# Patient Record
Sex: Female | Born: 1965 | Race: White | Hispanic: No | Marital: Married | Smoking: Never smoker
Health system: Southern US, Community
[De-identification: ages and names within clinical notes are randomized; demographics above are authoritative.]

## PROBLEM LIST (undated history)

## (undated) DIAGNOSIS — J309 Allergic rhinitis, unspecified: Secondary | ICD-10-CM

## (undated) DIAGNOSIS — M5410 Radiculopathy, site unspecified: Secondary | ICD-10-CM

## (undated) DIAGNOSIS — N938 Other specified abnormal uterine and vaginal bleeding: Secondary | ICD-10-CM

## (undated) DIAGNOSIS — M5030 Other cervical disc degeneration, unspecified cervical region: Secondary | ICD-10-CM

## (undated) DIAGNOSIS — Z8742 Personal history of other diseases of the female genital tract: Secondary | ICD-10-CM

## (undated) DIAGNOSIS — Z8489 Family history of other specified conditions: Secondary | ICD-10-CM

## (undated) DIAGNOSIS — F430 Acute stress reaction: Secondary | ICD-10-CM

## (undated) DIAGNOSIS — Z8619 Personal history of other infectious and parasitic diseases: Secondary | ICD-10-CM

## (undated) DIAGNOSIS — M25519 Pain in unspecified shoulder: Secondary | ICD-10-CM

## (undated) DIAGNOSIS — Z800 Family history of malignant neoplasm of digestive organs: Secondary | ICD-10-CM

## (undated) HISTORY — PX: ABDOMINAL HYSTERECTOMY: SHX81

## (undated) HISTORY — DX: Personal history of other diseases of the female genital tract: Z87.42

## (undated) HISTORY — DX: Allergic rhinitis, unspecified: J30.9

## (undated) HISTORY — DX: Personal history of other infectious and parasitic diseases: Z86.19

## (undated) HISTORY — DX: Acute stress reaction: F43.0

## (undated) HISTORY — DX: Radiculopathy, site unspecified: M54.10

## (undated) HISTORY — DX: Other cervical disc degeneration, unspecified cervical region: M50.30

## (undated) HISTORY — PX: LEEP: SHX91

## (undated) HISTORY — DX: Other specified abnormal uterine and vaginal bleeding: N93.8

## (undated) HISTORY — DX: Pain in unspecified shoulder: M25.519

## (undated) HISTORY — DX: Family history of malignant neoplasm of digestive organs: Z80.0

---

## 2009-07-19 DIAGNOSIS — B009 Herpesviral infection, unspecified: Secondary | ICD-10-CM | POA: Insufficient documentation

## 2009-07-19 DIAGNOSIS — Z800 Family history of malignant neoplasm of digestive organs: Secondary | ICD-10-CM | POA: Insufficient documentation

## 2009-08-07 DIAGNOSIS — L723 Sebaceous cyst: Secondary | ICD-10-CM | POA: Insufficient documentation

## 2011-11-17 ENCOUNTER — Ambulatory Visit: Payer: Self-pay | Admitting: Unknown Physician Specialty

## 2012-01-17 ENCOUNTER — Ambulatory Visit: Payer: Self-pay | Admitting: Obstetrics and Gynecology

## 2012-01-17 LAB — CBC
HCT: 40.5 % (ref 35.0–47.0)
MCH: 27.5 pg (ref 26.0–34.0)
MCV: 86 fL (ref 80–100)
Platelet: 321 10*3/uL (ref 150–440)
RDW: 13.6 % (ref 11.5–14.5)
WBC: 8.9 10*3/uL (ref 3.6–11.0)

## 2012-01-22 ENCOUNTER — Ambulatory Visit: Payer: Self-pay | Admitting: Obstetrics and Gynecology

## 2012-03-14 ENCOUNTER — Ambulatory Visit: Payer: Self-pay | Admitting: Obstetrics and Gynecology

## 2012-03-14 LAB — BASIC METABOLIC PANEL
BUN: 18 mg/dL (ref 7–18)
Chloride: 106 mmol/L (ref 98–107)
Co2: 24 mmol/L (ref 21–32)
Creatinine: 0.63 mg/dL (ref 0.60–1.30)
EGFR (Non-African Amer.): >60
Potassium: 4 mmol/L (ref 3.5–5.1)
Sodium: 136 mmol/L (ref 136–145)

## 2012-03-14 LAB — CBC
HCT: 38.9 % (ref 35.0–47.0)
MCH: 27.9 pg (ref 26.0–34.0)
MCV: 84 fL (ref 80–100)
Platelet: 346 10*3/uL (ref 150–440)
RBC: 4.65 10*6/uL (ref 3.80–5.20)
WBC: 7.6 10*3/uL (ref 3.6–11.0)

## 2012-03-14 LAB — PREGNANCY, URINE: Pregnancy Test, Urine: NEGATIVE m[IU]/mL

## 2012-03-18 ENCOUNTER — Ambulatory Visit: Payer: Self-pay | Admitting: Obstetrics and Gynecology

## 2012-03-19 LAB — CREATININE, SERUM: EGFR (Non-African Amer.): >60

## 2012-03-20 LAB — PATHOLOGY REPORT

## 2012-04-05 ENCOUNTER — Ambulatory Visit: Payer: Self-pay | Admitting: Family Medicine

## 2013-10-06 ENCOUNTER — Ambulatory Visit: Payer: Self-pay

## 2013-10-06 ENCOUNTER — Other Ambulatory Visit: Payer: Self-pay | Admitting: Occupational Medicine

## 2013-10-06 DIAGNOSIS — M79672 Pain in left foot: Secondary | ICD-10-CM

## 2014-08-11 NOTE — Op Note (Signed)
PATIENT NAME:  Jody Nolan, Jody Nolan MR#:  [id number] DATE OF BIRTH:  06-19-1965  DATE OF PROCEDURE:  03/18/2012  PREOPERATIVE DIAGNOSES:  1. Menorrhagia.  2. Severe dysmenorrhea.  3. History of cervical dysplasia.  POSTOPERATIVE DIAGNOSES: 1. Menorrhagia.  2. Severe dysmenorrhea.  3. History of cervical dysplasia.  4. Left ovarian cyst, simple.   OPERATIVE PROCEDURE: LAVH.   SURGEON: Alanda Slim. Lawton Dollinger, MD  FIRST ASSISTANT: None.   ANESTHESIA: General endotracheal.   INDICATIONS: The patient is a 49 year old married white female, para 1-0-0-2, who presents for surgical management of menorrhagia and pelvic pain. Previous hysteroscopy with dilation and curettage did not suppress abnormal bleeding. She desires definitive surgery at this time.   FINDINGS AT SURGERY: Grossly normal-appearing uterus. There were minimal adhesions over the bladder flap. The left ovary contained a simple cyst. Right tube and ovary were normal.   DESCRIPTION OF PROCEDURE: Patient was brought to the Operating Room where she was placed in the supine position. General endotracheal anesthesia was induced without difficulty. She was placed in the low lithotomy position using the bumblebee stirrups. A ChloraPrep and Betadine abdominal, perineal, intravaginal prep and drape was performed in the standard fashion. A latex free Foley catheter was placed and was draining clear yellow urine. A Hulka tenaculum was placed onto the cervix to facilitate uterine manipulation. Subumbilical vertical incision 5 mm in length was made. The Optiview laparoscopic trocar was then placed into the abdomen through direct entry with no evidence of bowel or vascular injury. Right and left lower quadrant ports, 5 mm in size, were also placed under direct visualization. The Ace Harmonic scalpel was used to facilitate the surgical procedure. The round ligaments were clamped, coagulated, and cut. The utero-ovarian ligaments were clamped, coagulated, and  cut using the Harmonic scalpel. This process continued down to the cardinal broad ligament complex region to the level of the lower uterine segment. At this point the bladder flap was created through sharp dissection and blunt dissection. The uterine vessels were exposed and were coagulated using Kleppinger bipolar cautery. The uterus did blanch as the vascular supply was cut off. Once this part of the procedure was completed the patient was then placed into the dorsal lithotomy position and repositioned for the vaginal portion of the hysterectomy. A weighted speculum was placed into the vagina. A double-tooth tenaculum was placed onto the cervix. Posterior colpotomy was attempted but not completed initially. The uterosacral ligaments were clamped, cut, and stick tied using 0 Vicryl. Cervix was circumscribed anteriorly and the bladder was dissected off the lower uterine segment through sharp and blunt dissection. The remainder of the cardinal broad ligament complexes were then clamped, cut, and stick tied. Eventually the posterior cul-de-sac was entered. The uterus was then removed from the operative field. The vaginal cuff was run using a 0 Vicryl suture in a baseball stitch manner. The vagina was closed with simple interrupted sutures of 2-0 chromic. Finally repeat laparoscopy was performed with the pelvis being irrigated. All pedicles were hemostatic. No significant bleeding was encountered. The procedure was then terminated with all instrumentation was removed from the pelvis. Pneumoperitoneum was released. The incisions were closed with Dermabond glue. Patient was then awakened, extubated, and taken to recovery room in satisfactory condition. Estimated blood loss was 200 mL. IV fluids were 2 liters. Urine output was not readily available at this time. All instruments, needle, and sponge counts were verified as correct. The patient did receive Ancef antibiotic prophylaxis.  ____________________________ Alanda Slim Junior Huezo, MD mad:cms D:  03/18/2012 17:17:41 ET T: 03/19/2012 09:49:46 ET JOB#: [id number]  cc: Hassell Done A. Luisalberto Beegle, MD, <Dictator>  Alanda Slim Daekwon Beswick MD ELECTRONICALLY SIGNED 03/21/2012 0:56

## 2014-08-11 NOTE — Op Note (Signed)
PATIENT NAME:  Jody Nolan, Jody Nolan MR#:  [id number] DATE OF BIRTH:  Feb 05, 1966  DATE OF PROCEDURE:  01/22/2012  PREOPERATIVE DIAGNOSES:  1. Menorrhagia.  2. Thickened endometrium on ultrasound.  3. Perineal skin lesions.   POSTOPERATIVE DIAGNOSES:  1. Menorrhagia.  2. Thickened endometrium on ultrasound.  3. Perineal skin lesions.  4. Severe cervical stenosis. 5. Uterine fibroids.   OPERATIVE PROCEDURES:  1. Excision of perineal skin lesions.  2. Hysteroscopy with dilation and curettage.  SURGEON: Brayton Mars, M.D.   FIRST ASSISTANT: None.   ANESTHESIA: General.   INDICATIONS: The patient is a 49 year old married white female with long history of menorrhagia. Preoperative ultrasound revealed a thickened endometrium with suspected endometrial polyps. Uterus had normal dimensions. Preoperative endometrial biopsy revealed scant tissue which was benign.   FINDINGS AT SURGERY: Densely stenotic cervix that required lacrimal duct probes as well as Hanks and Hegar dilators to gain access to the endometrial cavity. There was not significant endometrium present. The endometrial cavity did sound to 8 to 9 cm. The cavity was irregular and very suspicious for myoma involvement.   DESCRIPTION OF PROCEDURE: The patient was brought to the operating room where she was placed in the supine position. General anesthesia was induced without difficulty. She was placed in the dorsal lithotomy position using candy-cane stirrups. A Betadine and perineal intravaginal prep and drape was performed in the standard fashion. A red Robinson catheter was used to drain 100 mL of urine from the bladder. The perineal lesions which were pedunculated and on the right and left aspects of the perineum respectively were excised using a needle-tipped Bovie cautery. The two larger skin lesions required subcuticular sutures of 4-0 Vicryl for closure of the skin. Good hemostasis was obtained. Upon completion of the excisional  biopsies, the D and C/ hysteroscopy was performed.   A weighted speculum was placed into the vagina and a single-tooth tenaculum was placed on the anterior lip of the cervix. The cervix could not be sounded. Initially Hanks dilators could not identify the endocervical canal. Lacrimal duct probes then needed to be employed in order to identify the endocervical canal. Gradually the larger dilators gave way to the Hegar dilators, and then eventually the Hanks dilators to dilate adequately to gain access with the hysteroscope. The hysteroscopy with saline as irrigant revealed an endometrial cavity that was irregular and without significant tissue. Smooth and serrated curettes were used to curettage the endometrium with a very irregular contour being noted. Stone polyp forceps was used to extract residual tissue. Repeat hysteroscopy revealed no significant tissue left behind. The procedure was then terminated with all instrumentation being removed from the vagina. The patient was then awakened, mobilized, and taken to the recovery room in satisfactory condition.  Estimated blood loss was minimal. There were no complications. All instrument, needle, and sponge counts were verified as correct.  ADDENDUM NOTE:  The patient would be a candidate for laparoscopically-assisted vaginal hysterectomy if necessary.  ____________________________ Alanda Slim Davinity Fanara, MD mad:bjt D: 01/22/2012 19:07:16 ET T: 01/23/2012 09:37:08 ET JOB#: [id number]  cc: Hassell Done A. Chontel Warning, MD, <Dictator> Alanda Slim Shilee Biggs MD ELECTRONICALLY SIGNED 01/25/2012 20:39

## 2015-07-01 ENCOUNTER — Telehealth: Payer: Self-pay | Admitting: Family Medicine

## 2015-07-01 ENCOUNTER — Other Ambulatory Visit: Payer: Self-pay | Admitting: Family Medicine

## 2015-07-01 DIAGNOSIS — Z8619 Personal history of other infectious and parasitic diseases: Secondary | ICD-10-CM

## 2015-07-01 MED ORDER — VALACYCLOVIR HCL 1 G PO TABS: ORAL_TABLET | ORAL | Status: DC

## 2015-07-01 NOTE — Telephone Encounter (Signed)
Pt contacted office for refill request on the following medications: Valtrex 1 gm to [facility] [address]. Last written on 09/30/13 last OV 09/09/14. Pt is leaving tomorrow to go out of town and would like this sent today if possible. Pt stated she is getting a cold sore on her mouth. Please advise. Thanks TNP

## 2015-07-01 NOTE — Telephone Encounter (Signed)
Patient advised.

## 2015-07-01 NOTE — Telephone Encounter (Signed)
rx sent in 

## 2015-07-01 NOTE — Telephone Encounter (Signed)
Refill request

## 2016-01-17 ENCOUNTER — Encounter: Payer: Self-pay | Admitting: Family Medicine

## 2016-01-17 ENCOUNTER — Encounter: Payer: Self-pay | Admitting: *Deleted

## 2016-01-17 ENCOUNTER — Ambulatory Visit (INDEPENDENT_AMBULATORY_CARE_PROVIDER_SITE_OTHER): Payer: BC Managed Care – PPO | Admitting: Family Medicine

## 2016-01-17 VITALS — BP 110/70 | HR 85 | Temp 98.1°F | Resp 16 | Ht 65.5 in | Wt 200.6 lb

## 2016-01-17 DIAGNOSIS — J0100 Acute maxillary sinusitis, unspecified: Secondary | ICD-10-CM

## 2016-01-17 DIAGNOSIS — M25519 Pain in unspecified shoulder: Secondary | ICD-10-CM | POA: Insufficient documentation

## 2016-01-17 DIAGNOSIS — Z8619 Personal history of other infectious and parasitic diseases: Secondary | ICD-10-CM | POA: Diagnosis not present

## 2016-01-17 DIAGNOSIS — Z800 Family history of malignant neoplasm of digestive organs: Secondary | ICD-10-CM | POA: Insufficient documentation

## 2016-01-17 DIAGNOSIS — R87629 Unspecified abnormal cytological findings in specimens from vagina: Secondary | ICD-10-CM | POA: Insufficient documentation

## 2016-01-17 DIAGNOSIS — B009 Herpesviral infection, unspecified: Secondary | ICD-10-CM

## 2016-01-17 DIAGNOSIS — N938 Other specified abnormal uterine and vaginal bleeding: Secondary | ICD-10-CM | POA: Insufficient documentation

## 2016-01-17 DIAGNOSIS — M5410 Radiculopathy, site unspecified: Secondary | ICD-10-CM | POA: Insufficient documentation

## 2016-01-17 DIAGNOSIS — M5030 Other cervical disc degeneration, unspecified cervical region: Secondary | ICD-10-CM | POA: Insufficient documentation

## 2016-01-17 DIAGNOSIS — J309 Allergic rhinitis, unspecified: Secondary | ICD-10-CM | POA: Insufficient documentation

## 2016-01-17 DIAGNOSIS — H6980 Other specified disorders of Eustachian tube, unspecified ear: Secondary | ICD-10-CM | POA: Insufficient documentation

## 2016-01-17 MED ORDER — AMOXICILLIN-POT CLAVULANATE 875-125 MG PO TABS: 1.0000 | ORAL_TABLET | Freq: Two times a day (BID) | ORAL | 0 refills | Status: DC

## 2016-01-17 MED ORDER — VALACYCLOVIR HCL 1 G PO TABS: ORAL_TABLET | ORAL | 1 refills | Status: DC

## 2016-01-17 MED ORDER — HYDROCODONE-HOMATROPINE 5-1.5 MG/5ML PO SYRP: ORAL_SOLUTION | ORAL | 0 refills | Status: DC

## 2016-01-17 NOTE — Progress Notes (Signed)
Subjective:     Patient ID: Jody Nolan, female   DOB: 12-02-1965, 50 y.o.   MRN: [id number]  HPI  Chief Complaint  Patient presents with  . Cough    Patient comes into office today with concerns of cough and congestion for the past week. Patient states associated with cough she has sinus pain and pressure, patient states pain from cough has been radiating in her chest and ribs. Patient reports taking otc Robitussin Multi-Symptom and Aleve D  States she has persistent left sinus pressure, PND, and accompanying cough. Unable to characterize her drainage. Hx of allergic rhinitis and surgery for correction of a deviated septum. States she is planning to update her health maintenance with [place].   Review of Systems     Objective:   Physical Exam  Constitutional: She appears well-developed and well-nourished. No distress.  Ears: T.M's intact without inflammation Sinuses: moderate left maxillary sinus tenderness Throat: no tonsillar enlargement or exudate Neck: no cervical adenopathy Lungs: clear     Assessment:    1. Acute maxillary sinusitis, recurrence not specified - amoxicillin-clavulanate (AUGMENTIN) 875-125 MG tablet; Take 1 tablet by mouth 2 (two) times daily.  Dispense: 20 tablet; Refill: 0 - HYDROcodone-homatropine (HYCODAN) 5-1.5 MG/5ML syrup; 5 ml 4-6 hours as needed for cough  Dispense: 240 mL; Refill: 0  2. H/O cold sores - valACYclovir (VALTREX) 1000 MG tablet; Take two tablets every 12 h for one day at onset of cold core  Dispense: 12 tablet; Refill: 1    Plan:    Consider ENT referral if recurrent or not improving.

## 2016-01-17 NOTE — Patient Instructions (Signed)
Discussed use of Mucinex D for congestion. 

## 2016-02-25 ENCOUNTER — Encounter: Payer: BC Managed Care – PPO | Admitting: Physician Assistant

## 2016-03-01 ENCOUNTER — Encounter: Payer: Self-pay | Admitting: Physician Assistant

## 2016-03-01 ENCOUNTER — Ambulatory Visit (INDEPENDENT_AMBULATORY_CARE_PROVIDER_SITE_OTHER): Payer: BC Managed Care – PPO | Admitting: Physician Assistant

## 2016-03-01 VITALS — BP 120/80 | HR 99 | Temp 98.3°F | Resp 16 | Ht 66.0 in | Wt 197.4 lb

## 2016-03-01 DIAGNOSIS — Z0000 Encounter for general adult medical examination without abnormal findings: Secondary | ICD-10-CM | POA: Diagnosis not present

## 2016-03-01 DIAGNOSIS — R059 Cough, unspecified: Secondary | ICD-10-CM

## 2016-03-01 DIAGNOSIS — R87629 Unspecified abnormal cytological findings in specimens from vagina: Secondary | ICD-10-CM | POA: Diagnosis not present

## 2016-03-01 DIAGNOSIS — Z136 Encounter for screening for cardiovascular disorders: Secondary | ICD-10-CM | POA: Diagnosis not present

## 2016-03-01 DIAGNOSIS — Z1231 Encounter for screening mammogram for malignant neoplasm of breast: Secondary | ICD-10-CM | POA: Diagnosis not present

## 2016-03-01 DIAGNOSIS — Z1239 Encounter for other screening for malignant neoplasm of breast: Secondary | ICD-10-CM

## 2016-03-01 DIAGNOSIS — Z1272 Encounter for screening for malignant neoplasm of vagina: Secondary | ICD-10-CM | POA: Diagnosis not present

## 2016-03-01 DIAGNOSIS — Z13220 Encounter for screening for lipoid disorders: Secondary | ICD-10-CM

## 2016-03-01 DIAGNOSIS — R05 Cough: Secondary | ICD-10-CM

## 2016-03-01 DIAGNOSIS — Z833 Family history of diabetes mellitus: Secondary | ICD-10-CM | POA: Diagnosis not present

## 2016-03-01 MED ORDER — PREDNISONE 10 MG PO TABS: ORAL_TABLET | ORAL | 0 refills | Status: DC

## 2016-03-01 NOTE — Progress Notes (Signed)
Patient: Jody Nolan, Female    DOB: 1965/12/14, 50 y.o.   MRN: [id number] Visit Date: 03/01/2016  Today's Provider: Mar Daring, PA-C   Chief Complaint  Patient presents with  . Annual Exam   Subjective:    Annual physical exam Jody Nolan is a 50 y.o. female who presents today for health maintenance and complete physical. She feels fairly well. She reports exercising none. She does work at school and walks a lot. She reports she is sleeping fairly well right now because of her cold but overall she she sleep well.  She declined Influenza vaccine.  CPE-10/30/13 Pap:10/28/12 Negative, HPV-Negative @[facility] . Hx of abnormal pap. She had her hysterectomy 5 years ago.(partial) Mammogram:11/21/2013-BI-RADS 1 -----------------------------------------------------------------   Review of Systems  Constitutional: Negative.   HENT: Positive for congestion, rhinorrhea and sinus pressure.        Patient reports that she finished the antibiotic but still taking the cough syrup, zyrtec, mucinex and sudafed  Eyes: Negative.   Respiratory: Positive for cough.   Cardiovascular: Negative.   Gastrointestinal: Negative.   Endocrine: Negative.   Genitourinary: Negative.   Musculoskeletal: Negative.   Skin: Negative.   Allergic/Immunologic: Negative.   Neurological: Negative.   Hematological: Negative.   Psychiatric/Behavioral: Negative.     Social History      She  reports that she has never smoked. She has never used smokeless tobacco. She reports that she drinks alcohol. She reports that she does not use drugs.       Social History   Social History  . Marital status: Married    Spouse name: N/A  . Number of children: 2  . Years of education: College Grad   Occupational History  . employed     [employer] History Main Topics  . Smoking status: Never Smoker  . Smokeless tobacco: Never Used  . Alcohol use Yes  . Drug use: No  .  Sexual activity: Yes   Other Topics Concern  . None   Social History Narrative  . None    Past Medical History:  Diagnosis Date  . Allergic rhinitis   . Degeneration of cervical intervertebral disc   . DUB (dysfunctional uterine bleeding)   . Family history of colon cancer   . History of abnormal cervical Pap smear   . History of chicken pox   . Radiculopathy   . Reaction, stress, acute   . Shoulder pain      Patient Active Problem List   Diagnosis Date Noted  . Allergic rhinitis 01/17/2016  . Degeneration of cervical intervertebral disc 01/17/2016  . Dysfunctional uterine bleeding 01/17/2016  . Family history of GI tract cancer 01/17/2016  . Vaginal Pap smear, abnormal 01/17/2016  . Radiculopathy 01/17/2016  . Family history of colon cancer 07/19/2009  . Uncomplicated herpes simplex 07/19/2009    Past Surgical History:  Procedure Laterality Date  . CESAREAN SECTION  2000  . LEEP  1990's    Family History        Family Status  Relation Status  . Mother Alive  . Father Deceased at age 44   colon cancer  . Brother Alive  . Daughter Alive  . Son Alive  . Maternal Grandmother Alive, age 64y  . Paternal 97, age 78y        Her family history includes Cancer in her father; Hypertension in her brother and paternal grandmother; Stroke in her paternal grandmother.  No Known Allergies   Current Outpatient Prescriptions:  .  Cetirizine HCl (ZYRTEC ALLERGY) 10 MG CAPS, Take 1 tablet by mouth daily., Disp: , Rfl:  .  HYDROcodone-homatropine (HYCODAN) 5-1.5 MG/5ML syrup, 5 ml 4-6 hours as needed for cough, Disp: 240 mL, Rfl: 0 .  valACYclovir (VALTREX) 1000 MG tablet, Take two tablets every 12 h for one day at onset of cold core, Disp: 12 tablet, Rfl: 1   Patient Care Team: Carmon Ginsberg, PA as PCP - General (Family Medicine)      Objective:   Vitals: BP 120/80 (BP Location: Right Arm, Patient Position: Sitting, Cuff Size: Normal)   Pulse 99    Temp 98.3 F (36.8 C) (Oral)   Resp 16   Ht 5\' 6"  (1.676 m)   Wt 197 lb 6.4 oz (89.5 kg)   SpO2 100%   BMI 31.86 kg/m    Physical Exam  Constitutional: She is oriented to person, place, and time. She appears well-developed and well-nourished. No distress.  HENT:  Head: Normocephalic and atraumatic.  Right Ear: Hearing, tympanic membrane, external ear and ear canal normal.  Left Ear: Hearing, tympanic membrane, external ear and ear canal normal.  Nose: Nose normal.  Mouth/Throat: Uvula is midline, oropharynx is clear and moist and mucous membranes are normal. No oropharyngeal exudate.  Eyes: Conjunctivae and EOM are normal. Pupils are equal, round, and reactive to light. Right eye exhibits no discharge. Left eye exhibits no discharge. No scleral icterus.  Neck: Normal range of motion. Neck supple. No JVD present. Carotid bruit is not present. No tracheal deviation present. No thyromegaly present.  Cardiovascular: Normal rate, regular rhythm, normal heart sounds and intact distal pulses.  Exam reveals no gallop and no friction rub.   No murmur heard. Pulmonary/Chest: Effort normal and breath sounds normal. No respiratory distress. She has no wheezes. She has no rales. She exhibits no tenderness. Right breast exhibits no inverted nipple, no mass, no nipple discharge, no skin change and no tenderness. Left breast exhibits no inverted nipple, no mass, no nipple discharge, no skin change and no tenderness. Breasts are symmetrical.  Abdominal: Soft. Bowel sounds are normal. She exhibits no distension and no mass. There is no tenderness. There is no rebound and no guarding. Hernia confirmed negative in the right inguinal area and confirmed negative in the left inguinal area.  Genitourinary: Rectum normal. No breast swelling, tenderness, discharge or bleeding. Pelvic exam was performed with patient supine. There is no rash, tenderness, lesion or injury on the right labia. There is no rash,  tenderness, lesion or injury on the left labia. Right adnexum displays no mass, no tenderness and no fullness. Left adnexum displays no mass, no tenderness and no fullness. No erythema, tenderness or bleeding in the vagina. No signs of injury around the vagina. Vaginal discharge (thick white discharge noted) found.  Genitourinary Comments: Uterus and cervix surgically absent  Musculoskeletal: Normal range of motion. She exhibits no edema or tenderness.  Lymphadenopathy:    She has no cervical adenopathy.       Right: No inguinal adenopathy present.       Left: No inguinal adenopathy present.  Neurological: She is alert and oriented to person, place, and time. She has normal reflexes. No cranial nerve deficit. Coordination normal.  Skin: Skin is warm and dry. No rash noted. She is not diaphoretic.  Psychiatric: She has a normal mood and affect. Her behavior is normal. Judgment and thought content normal.  Vitals reviewed.  Depression Screen No flowsheet data found.    Assessment & Plan:     Routine Health Maintenance and Physical Exam  Exercise Activities and Dietary recommendations Goals    None       There is no immunization history on file for this patient.  Health Maintenance  Topic Date Due  . HIV Screening  10/21/1982  . TETANUS/TDAP  10/21/1986  . PAP SMEAR  10/29/2015  . INFLUENZA VACCINE  11/23/2015     Discussed health benefits of physical activity, and encouraged her to engage in regular exercise appropriate for her age and condition.    1. Annual physical exam Normal physical exam today. Will check labs as below and f/u pending lab results. If labs are stable and WNL she will not need to have these rechecked for one year at her next annual physical exam. She is to call the office in the meantime if she has any acute issue, questions or concerns. - CBC with Differential/Platelet - Comprehensive metabolic panel - TSH  2. Breast cancer screening Breast  exam today was normal. There is no family history of breast cancer. She does perform regular self breast exams. Mammogram was ordered as below. She may schedule her mammogram at her convenience with [facility]. - MM DIGITAL SCREENING BILATERAL; Future  3. Screening for vaginal cancer Pap collected today. Will send as below and f/u pending results. - Pap IG and HPV (high risk) DNA detection  4. Vaginal Pap smear, abnormal Pap collected today. Will send as below and f/u pending results. Previous pap in 2014 and 2015 normal. Pap prior to hysterectomy was abnormal. If this pap is normal will make 3rd normal and she can go back on regular screenings. - Pap IG and HPV (high risk) DNA detection  5. Family history of diabetes mellitus (DM) Will check labs as below and f/u pending results. - Hemoglobin A1c  6. Encounter for lipid screening for cardiovascular disease Will check labs as below and f/u pending results. - Lipid panel  7. Cough Lingering cough that has not resolved following antibiotic treatment from 01/17/2016. I will give her prednisone taper as below for cough. She is to call for cough does not improve following this treatment. - predniSONE (DELTASONE) 10 MG tablet; Take 6 tabs PO on day 1&2, 5 tabs PO on day 3&4, 4 tabs PO on day 5&6, 3 tabs PO on day 7&8, 2 tabs PO on day 9&10, 1 tab PO on day 11&12.  Dispense: 42 tablet; Refill: 0  --------------------------------------------------------------------    Mar Daring, PA-C  [facility]

## 2016-03-01 NOTE — Patient Instructions (Signed)
Health Maintenance, Female Adopting a healthy lifestyle and getting preventive care can go a long way to promote health and wellness. Talk with your health care provider about what schedule of regular examinations is right for you. This is a good chance for you to check in with your provider about disease prevention and staying healthy. In between checkups, there are plenty of things you can do on your own. Experts have done a lot of research about which lifestyle changes and preventive measures are most likely to keep you healthy. Ask your health care provider for more information. WEIGHT AND DIET  Eat a healthy diet  Be sure to include plenty of vegetables, fruits, low-fat dairy products, and lean protein.  Do not eat a lot of foods high in solid fats, added sugars, or salt.  Get regular exercise. This is one of the most important things you can do for your health.  Most adults should exercise for at least 150 minutes each week. The exercise should increase your heart rate and make you sweat (moderate-intensity exercise).  Most adults should also do strengthening exercises at least twice a week. This is in addition to the moderate-intensity exercise.  Maintain a healthy weight  Body mass index (BMI) is a measurement that can be used to identify possible weight problems. It estimates body fat based on height and weight. Your health care provider can help determine your BMI and help you achieve or maintain a healthy weight.  For females 20 years of age and older:   A BMI below 18.5 is considered underweight.  A BMI of 18.5 to 24.9 is normal.  A BMI of 25 to 29.9 is considered overweight.  A BMI of 30 and above is considered obese.  Watch levels of cholesterol and blood lipids  You should start having your blood tested for lipids and cholesterol at 48 years of age, then have this test every 5 years.  You may need to have your cholesterol levels checked more often if:  Your lipid  or cholesterol levels are high.  You are older than 48 years of age.  You are at high risk for heart disease.  CANCER SCREENING   Lung Cancer  Lung cancer screening is recommended for adults 55-80 years old who are at high risk for lung cancer because of a history of smoking.  A yearly low-dose CT scan of the lungs is recommended for people who:  Currently smoke.  Have quit within the past 15 years.  Have at least a 30-pack-year history of smoking. A pack year is smoking an average of one pack of cigarettes a day for 1 year.  Yearly screening should continue until it has been 15 years since you quit.  Yearly screening should stop if you develop a health problem that would prevent you from having lung cancer treatment.  Breast Cancer  Practice breast self-awareness. This means understanding how your breasts normally appear and feel.  It also means doing regular breast self-exams. Let your health care provider know about any changes, no matter how small.  If you are in your 20s or 30s, you should have a clinical breast exam (CBE) by a health care provider every 1-3 years as part of a regular health exam.  If you are 40 or older, have a CBE every year. Also consider having a breast X-ray (mammogram) every year.  If you have a family history of breast cancer, talk to your health care provider about genetic screening.  If you   are at high risk for breast cancer, talk to your health care provider about having an MRI and a mammogram every year.  Breast cancer gene (BRCA) assessment is recommended for women who have family members with BRCA-related cancers. BRCA-related cancers include:  Breast.  Ovarian.  Tubal.  Peritoneal cancers.  Results of the assessment will determine the need for genetic counseling and BRCA1 and BRCA2 testing. Cervical Cancer Your health care provider may recommend that you be screened regularly for cancer of the pelvic organs (ovaries, uterus, and  vagina). This screening involves a pelvic examination, including checking for microscopic changes to the surface of your cervix (Pap test). You may be encouraged to have this screening done every 3 years, beginning at age 21.  For women ages 30-65, health care providers may recommend pelvic exams and Pap testing every 3 years, or they may recommend the Pap and pelvic exam, combined with testing for human papilloma virus (HPV), every 5 years. Some types of HPV increase your risk of cervical cancer. Testing for HPV may also be done on women of any age with unclear Pap test results.  Other health care providers may not recommend any screening for nonpregnant women who are considered low risk for pelvic cancer and who do not have symptoms. Ask your health care provider if a screening pelvic exam is right for you.  If you have had past treatment for cervical cancer or a condition that could lead to cancer, you need Pap tests and screening for cancer for at least 20 years after your treatment. If Pap tests have been discontinued, your risk factors (such as having a new sexual partner) need to be reassessed to determine if screening should resume. Some women have medical problems that increase the chance of getting cervical cancer. In these cases, your health care provider may recommend more frequent screening and Pap tests. Colorectal Cancer  This type of cancer can be detected and often prevented.  Routine colorectal cancer screening usually begins at 48 years of age and continues through 48 years of age.  Your health care provider may recommend screening at an earlier age if you have risk factors for colon cancer.  Your health care provider may also recommend using home test kits to check for hidden blood in the stool.  A small camera at the end of a tube can be used to examine your colon directly (sigmoidoscopy or colonoscopy). This is done to check for the earliest forms of colorectal  cancer.  Routine screening usually begins at age 50.  Direct examination of the colon should be repeated every 5-10 years through 48 years of age. However, you may need to be screened more often if early forms of precancerous polyps or small growths are found. Skin Cancer  Check your skin from head to toe regularly.  Tell your health care provider about any new moles or changes in moles, especially if there is a change in a mole's shape or color.  Also tell your health care provider if you have a mole that is larger than the size of a pencil eraser.  Always use sunscreen. Apply sunscreen liberally and repeatedly throughout the day.  Protect yourself by wearing long sleeves, pants, a wide-brimmed hat, and sunglasses whenever you are outside. HEART DISEASE, DIABETES, AND HIGH BLOOD PRESSURE   High blood pressure causes heart disease and increases the risk of stroke. High blood pressure is more likely to develop in:  People who have blood pressure in the high end   of the normal range (130-139/85-89 mm Hg).  People who are overweight or obese.  People who are African American.  If you are 38-23 years of age, have your blood pressure checked every 3-5 years. If you are 61 years of age or older, have your blood pressure checked every year. You should have your blood pressure measured twice--once when you are at a hospital or clinic, and once when you are not at a hospital or clinic. Record the average of the two measurements. To check your blood pressure when you are not at a hospital or clinic, you can use:  An automated blood pressure machine at a pharmacy.  A home blood pressure monitor.  If you are between 45 years and 39 years old, ask your health care provider if you should take aspirin to prevent strokes.  Have regular diabetes screenings. This involves taking a blood sample to check your fasting blood sugar level.  If you are at a normal weight and have a low risk for diabetes,  have this test once every three years after 48 years of age.  If you are overweight and have a high risk for diabetes, consider being tested at a younger age or more often. PREVENTING INFECTION  Hepatitis B  If you have a higher risk for hepatitis B, you should be screened for this virus. You are considered at high risk for hepatitis B if:  You were born in a country where hepatitis B is common. Ask your health care provider which countries are considered high risk.  Your parents were born in a high-risk country, and you have not been immunized against hepatitis B (hepatitis B vaccine).  You have HIV or AIDS.  You use needles to inject street drugs.  You live with someone who has hepatitis B.  You have had sex with someone who has hepatitis B.  You get hemodialysis treatment.  You take certain medicines for conditions, including cancer, organ transplantation, and autoimmune conditions. Hepatitis C  Blood testing is recommended for:  Everyone born from [birth year] through 1963.  Anyone with known risk factors for hepatitis C. Sexually transmitted infections (STIs)  You should be screened for sexually transmitted infections (STIs) including gonorrhea and chlamydia if:  You are sexually active and are younger than 48 years of age.  You are older than 48 years of age and your health care provider tells you that you are at risk for this type of infection.  Your sexual activity has changed since you were last screened and you are at an increased risk for chlamydia or gonorrhea. Ask your health care provider if you are at risk.  If you do not have HIV, but are at risk, it may be recommended that you take a prescription medicine daily to prevent HIV infection. This is called pre-exposure prophylaxis (PrEP). You are considered at risk if:  You are sexually active and do not regularly use condoms or know the HIV status of your partner(s).  You take drugs by injection.  You are sexually  active with a partner who has HIV. Talk with your health care provider about whether you are at high risk of being infected with HIV. If you choose to begin PrEP, you should first be tested for HIV. You should then be tested every 3 months for as long as you are taking PrEP.  PREGNANCY   If you are premenopausal and you may become pregnant, ask your health care provider about preconception counseling.  If you may  become pregnant, take 400 to 800 micrograms (mcg) of folic acid every day.  If you want to prevent pregnancy, talk to your health care provider about birth control (contraception). OSTEOPOROSIS AND MENOPAUSE   Osteoporosis is a disease in which the bones lose minerals and strength with aging. This can result in serious bone fractures. Your risk for osteoporosis can be identified using a bone density scan.  If you are 61 years of age or older, or if you are at risk for osteoporosis and fractures, ask your health care provider if you should be screened.  Ask your health care provider whether you should take a calcium or vitamin D supplement to lower your risk for osteoporosis.  Menopause may have certain physical symptoms and risks.  Hormone replacement therapy may reduce some of these symptoms and risks. Talk to your health care provider about whether hormone replacement therapy is right for you.  HOME CARE INSTRUCTIONS   Schedule regular health, dental, and eye exams.  Stay current with your immunizations.   Do not use any tobacco products including cigarettes, chewing tobacco, or electronic cigarettes.  If you are pregnant, do not drink alcohol.  If you are breastfeeding, limit how much and how often you drink alcohol.  Limit alcohol intake to no more than 1 drink per day for nonpregnant women. One drink equals 12 ounces of beer, 5 ounces of wine, or 1 ounces of hard liquor.  Do not use street drugs.  Do not share needles.  Ask your health care provider for help if  you need support or information about quitting drugs.  Tell your health care provider if you often feel depressed.  Tell your health care provider if you have ever been abused or do not feel safe at home.   This information is not intended to replace advice given to you by your health care provider. Make sure you discuss any questions you have with your health care provider.   Document Released: 10/24/2010 Document Revised: 05/01/2014 Document Reviewed: 03/12/2013 Elsevier Interactive Patient Education [employer].

## 2016-03-02 ENCOUNTER — Telehealth: Payer: Self-pay

## 2016-03-02 LAB — COMPREHENSIVE METABOLIC PANEL
ALT: 20 IU/L (ref 0–32)
AST: 16 IU/L (ref 0–40)
Albumin/Globulin Ratio: 1.9 (ref 1.2–2.2)
Albumin: 4.6 g/dL (ref 3.5–5.5)
Alkaline Phosphatase: 54 IU/L (ref 39–117)
BUN/Creatinine Ratio: 24 — ABNORMAL HIGH (ref 9–23)
BUN: 17 mg/dL (ref 6–24)
Bilirubin Total: 0.4 mg/dL (ref 0.0–1.2)
CALCIUM: 9.7 mg/dL (ref 8.7–10.2)
CO2: 24 mmol/L (ref 18–29)
CREATININE: 0.72 mg/dL (ref 0.57–1.00)
Chloride: 100 mmol/L (ref 96–106)
GFR calc Af Amer: 115 mL/min/{1.73_m2} (ref >59)
GFR, EST NON AFRICAN AMERICAN: 99 mL/min/{1.73_m2} (ref >59)
GLUCOSE: 83 mg/dL (ref 65–99)
Globulin, Total: 2.4 g/dL (ref 1.5–4.5)
Potassium: 5 mmol/L (ref 3.5–5.2)
Sodium: 141 mmol/L (ref 134–144)
Total Protein: 7 g/dL (ref 6.0–8.5)

## 2016-03-02 LAB — LIPID PANEL
CHOLESTEROL TOTAL: 239 mg/dL — AB (ref 100–199)
Chol/HDL Ratio: 4.1 ratio units (ref 0.0–4.4)
HDL: 58 mg/dL (ref >39)
LDL Calculated: 156 mg/dL — ABNORMAL HIGH (ref 0–99)
Triglycerides: 127 mg/dL (ref 0–149)
VLDL CHOLESTEROL CAL: 25 mg/dL (ref 5–40)

## 2016-03-02 LAB — CBC WITH DIFFERENTIAL/PLATELET
BASOS ABS: 0 10*3/uL (ref 0.0–0.2)
Basos: 0 %
EOS (ABSOLUTE): 0.2 10*3/uL (ref 0.0–0.4)
EOS: 2 %
Hematocrit: 44.2 % (ref 34.0–46.6)
Hemoglobin: 14.4 g/dL (ref 11.1–15.9)
IMMATURE GRANULOCYTES: 1 %
Immature Grans (Abs): 0.1 10*3/uL (ref 0.0–0.1)
Lymphocytes Absolute: 2.6 10*3/uL (ref 0.7–3.1)
Lymphs: 27 %
MCH: 29.4 pg (ref 26.6–33.0)
MCHC: 32.6 g/dL (ref 31.5–35.7)
MCV: 90 fL (ref 79–97)
MONOS ABS: 0.7 10*3/uL (ref 0.1–0.9)
Monocytes: 8 %
NEUTROS PCT: 62 %
Neutrophils Absolute: 5.9 10*3/uL (ref 1.4–7.0)
PLATELETS: 353 10*3/uL (ref 150–379)
RBC: 4.9 x10E6/uL (ref 3.77–5.28)
RDW: 12.7 % (ref 12.3–15.4)
WBC: 9.6 10*3/uL (ref 3.4–10.8)

## 2016-03-02 LAB — HEMOGLOBIN A1C
ESTIMATED AVERAGE GLUCOSE: 114 mg/dL
HEMOGLOBIN A1C: 5.6 % (ref 4.8–5.6)

## 2016-03-02 LAB — TSH: TSH: 2.76 u[IU]/mL (ref 0.450–4.500)

## 2016-03-02 NOTE — Telephone Encounter (Signed)
Patient advised as directed below.  Thanks,  -Joseline 

## 2016-03-02 NOTE — Telephone Encounter (Signed)
-----   Message from Mar Daring, [place] sent at 03/02/2016  8:21 AM EST ----- Cholesterol is borderline high but 10 yr cardiovascular risk is still low. Recommend lifestyle modifications with healthy dieting (low fats and carbohydrates) and increasing physical activity. American heart assoc recommends 150 min moderate activity per week. All other labs are WNL.

## 2016-03-02 NOTE — Telephone Encounter (Signed)
LMTCB  Thanks,  -Joseline 

## 2016-03-09 ENCOUNTER — Telehealth: Payer: Self-pay

## 2016-03-09 DIAGNOSIS — L989 Disorder of the skin and subcutaneous tissue, unspecified: Secondary | ICD-10-CM

## 2016-03-09 LAB — PAP IG AND HPV HIGH-RISK
HPV, high-risk: NEGATIVE
PAP Smear Comment: 0

## 2016-03-09 NOTE — Telephone Encounter (Signed)
Patient advised as below. Patient verbalizes understanding and is in agreement with treatment plan.  Patient reports that she talked to you about getting a referral to dermatology to have some mole removed.

## 2016-03-09 NOTE — Telephone Encounter (Signed)
I am sorry I thought she already had a dermatologist. That is my mistake. Referral placed.

## 2016-03-09 NOTE — Telephone Encounter (Signed)
-----   Message from Mar Daring, PA-C sent at 03/09/2016  9:41 AM EST ----- Pap is normal, HPV negative.  Will repeat in 3-5 years.

## 2016-03-15 ENCOUNTER — Encounter: Payer: Self-pay | Admitting: Physician Assistant

## 2016-03-20 ENCOUNTER — Telehealth: Payer: Self-pay | Admitting: Physician Assistant

## 2016-03-20 DIAGNOSIS — R928 Other abnormal and inconclusive findings on diagnostic imaging of breast: Secondary | ICD-10-CM

## 2016-03-20 NOTE — Telephone Encounter (Signed)
Orders placed for diagnostic and [place] of left breast due to abnormal mammogram on 03/13/16. Patient uses [facility].

## 2016-03-20 NOTE — Telephone Encounter (Signed)
Pt stated she was advised to speak with Jenni's nurse to discuss scheduling another mammogram since her mammogram came back abnormal. Pt request a call back. Please advise. Thanks TNP

## 2016-03-21 NOTE — Telephone Encounter (Signed)
[facility] no longer does diagnostic mammograms.Pt states she will go to [facility] has been notified and states they will contact pt when they get film from prior mammogram

## 2016-03-22 ENCOUNTER — Other Ambulatory Visit: Payer: Self-pay | Admitting: *Deleted

## 2016-03-22 ENCOUNTER — Inpatient Hospital Stay
Admission: RE | Admit: 2016-03-22 | Discharge: 2016-03-22 | Disposition: A | Discharge status: Home / Self Care | Payer: Self-pay | Source: Ambulatory Visit | Attending: *Deleted | Admitting: *Deleted

## 2016-03-22 DIAGNOSIS — Z9289 Personal history of other medical treatment: Secondary | ICD-10-CM

## 2016-03-23 ENCOUNTER — Other Ambulatory Visit: Payer: Self-pay | Admitting: Physician Assistant

## 2016-03-23 DIAGNOSIS — R928 Other abnormal and inconclusive findings on diagnostic imaging of breast: Secondary | ICD-10-CM

## 2016-03-23 NOTE — Progress Notes (Signed)
Ordered diagnostic mammogram and [place] of left breast for [facility] breast clinic

## 2016-03-29 ENCOUNTER — Other Ambulatory Visit: Payer: Self-pay | Admitting: Physician Assistant

## 2016-03-29 ENCOUNTER — Ambulatory Visit
Admission: RE | Admit: 2016-03-29 | Discharge: 2016-03-29 | Disposition: A | Discharge status: Home / Self Care | Payer: BC Managed Care – PPO | Source: Ambulatory Visit | Attending: Physician Assistant | Admitting: Physician Assistant

## 2016-03-29 ENCOUNTER — Telehealth: Payer: Self-pay

## 2016-03-29 DIAGNOSIS — R928 Other abnormal and inconclusive findings on diagnostic imaging of breast: Secondary | ICD-10-CM

## 2016-03-29 NOTE — Telephone Encounter (Signed)
-----   Message from Mar Daring, [place] sent at 03/29/2016  1:14 PM EST ----- Further imaging of left breast was normal. Repeat screening in one year

## 2016-03-29 NOTE — Telephone Encounter (Signed)
Pt advised. Pt expresses verbal understanding and agrees with the plan. Renaldo Fiddler, CMA

## 2016-04-07 ENCOUNTER — Ambulatory Visit: Payer: Self-pay

## 2016-04-07 ENCOUNTER — Other Ambulatory Visit: Payer: Self-pay

## 2016-04-13 ENCOUNTER — Encounter: Payer: Self-pay | Admitting: Family Medicine

## 2016-04-13 ENCOUNTER — Ambulatory Visit (INDEPENDENT_AMBULATORY_CARE_PROVIDER_SITE_OTHER): Payer: BC Managed Care – PPO | Admitting: Family Medicine

## 2016-04-13 VITALS — BP 110/74 | HR 101 | Temp 98.3°F | Resp 17 | Wt 202.6 lb

## 2016-04-13 DIAGNOSIS — R062 Wheezing: Secondary | ICD-10-CM

## 2016-04-13 DIAGNOSIS — J0100 Acute maxillary sinusitis, unspecified: Secondary | ICD-10-CM

## 2016-04-13 MED ORDER — PREDNISONE 20 MG PO TABS: ORAL_TABLET | ORAL | 0 refills | Status: DC

## 2016-04-13 MED ORDER — ALBUTEROL SULFATE HFA 108 (90 BASE) MCG/ACT IN AERS: 2.0000 | INHALATION_SPRAY | Freq: Four times a day (QID) | RESPIRATORY_TRACT | 2 refills | Status: DC | PRN

## 2016-04-13 MED ORDER — AMOXICILLIN-POT CLAVULANATE 875-125 MG PO TABS: 1.0000 | ORAL_TABLET | Freq: Two times a day (BID) | ORAL | 0 refills | Status: DC

## 2016-04-13 NOTE — Patient Instructions (Signed)
Schedule albuterol at least twice daily while ill. Start prednisone if increased shortness of breath or wheezing.

## 2016-04-13 NOTE — Progress Notes (Signed)
Subjective:     Patient ID: Jody Nolan, female   DOB: 1965-05-19, 50 y.o.   MRN: [id number]  HPI  Chief Complaint  Patient presents with  . Sinus Problem    Patient comes in office today with concerns of sinus congestion and cough for one week. Patient states that cough is gradually getting worse and has sinus pain and pressure on left side of face. Patient reports wheezing and tightness in her chest, she has been taking otc Mucinex  Reports she is planning to see ENT about getting frequent sinus infections. Prior surgery per Dr. Tami Ribas.   Review of Systems     Objective:   Physical Exam  Constitutional: She appears well-developed and well-nourished. No distress.  Ears: T.M's intact without inflammation Sinuses: moderate tenderness left maxillary area Throat: no tonsillar enlargement or exudate Neck: no cervical adenopathy Lungs: scattered expiratory wheezes posterior fields     Assessment:    1. Acute non-recurrent maxillary sinusitis - amoxicillin-clavulanate (AUGMENTIN) 875-125 MG tablet; Take 1 tablet by mouth 2 (two) times daily.  Dispense: 20 tablet; Refill: 0  2. Wheezes - predniSONE (DELTASONE) 20 MG tablet; Take one pill twice daily for 5 days  Dispense: 10 tablet; Refill: 0 - albuterol (PROVENTIL HFA;VENTOLIN HFA) 108 (90 Base) MCG/ACT inhaler; Inhale 2 puffs into the lungs every 6 (six) hours as needed for wheezing or shortness of breath.  Dispense: 1 Inhaler; Refill: 2    Plan:    Schedule albuterol and start prednisone if increased shortness of breath. Continue Mucinex multi-symptom product. Encouraged f/u with ENT

## 2016-05-04 ENCOUNTER — Telehealth: Payer: Self-pay | Admitting: Family Medicine

## 2016-05-04 NOTE — Telephone Encounter (Signed)
Pt haf PE with Sonia Baller last month and is wanting to talk to her about using phentermine.  She said you and she talked about weight loss at her visit.  Pt's call back is [phone]  Thanks Con Memos

## 2016-05-04 NOTE — Telephone Encounter (Signed)
Please review. Any questions you need me to ask the patient? Thanks!

## 2016-05-04 NOTE — Telephone Encounter (Signed)
Patient reports that she has been making lifestyle changes since her last appt back in Nov. 2017. Patient reports that she would like to come in and talk to you about weight management. Appt was scheduled.

## 2016-05-04 NOTE — Telephone Encounter (Signed)
She has to have started lifestyle changes on her own, healthy dieting and increasing physical activity for a minimum of 4 weeks. Then she will need a OV with me for weight management for the Rx.

## 2016-05-08 ENCOUNTER — Encounter: Payer: Self-pay | Admitting: Physician Assistant

## 2016-05-08 ENCOUNTER — Ambulatory Visit (INDEPENDENT_AMBULATORY_CARE_PROVIDER_SITE_OTHER): Payer: BC Managed Care – PPO | Admitting: Physician Assistant

## 2016-05-08 VITALS — BP 120/80 | HR 86 | Temp 98.2°F | Resp 16 | Ht 65.5 in | Wt 204.0 lb

## 2016-05-08 DIAGNOSIS — Z713 Dietary counseling and surveillance: Secondary | ICD-10-CM

## 2016-05-08 DIAGNOSIS — Z6833 Body mass index (BMI) 33.0-33.9, adult: Secondary | ICD-10-CM | POA: Diagnosis not present

## 2016-05-08 DIAGNOSIS — E6609 Other obesity due to excess calories: Secondary | ICD-10-CM | POA: Diagnosis not present

## 2016-05-08 NOTE — Patient Instructions (Signed)

## 2016-05-08 NOTE — Progress Notes (Signed)
Patient: Jody Nolan Female    DOB: [birth year]   51 y.o.   MRN: [id number] Visit Date: 05/08/2016  Today's Provider: Mar Daring, PA-C   Chief Complaint  Patient presents with  . Obesity   Subjective:    HPI Patient here today to talk about weight loss options. 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  He has not been exercising regularly and is not currently restricting any caloric intake. She does report that she is trying to make healthier options including limiting carbohydrates and sugars. She does not drink sodas anymore as well.  She has done a self-directed weight loss journey in the past approximately 2-3 years ago where she was able to lose 32 pounds on her own. She has not gained back to her highest adult weight but has gained approximately 15-20 pounds back since.    No Known Allergies  Patient Active Problem List   Diagnosis Date Noted  . Allergic rhinitis 01/17/2016  . Degeneration of cervical intervertebral disc 01/17/2016  . Dysfunctional uterine bleeding 01/17/2016  . Family history of GI tract cancer 01/17/2016  . Vaginal Pap smear, abnormal 01/17/2016  . Radiculopathy 01/17/2016  . Family history of colon cancer 07/19/2009  . Uncomplicated herpes simplex 07/19/2009     Current Outpatient Prescriptions:  .  albuterol (PROVENTIL HFA;VENTOLIN HFA) 108 (90 Base) MCG/ACT inhaler, Inhale 2 puffs into the lungs every 6 (six) hours as needed for wheezing or shortness of breath., Disp: 1 Inhaler, Rfl: 2 .  Cetirizine HCl (ZYRTEC ALLERGY) 10 MG CAPS, Take 1 tablet by mouth daily., Disp: , Rfl:  .  valACYclovir (VALTREX) 1000 MG tablet, Take two tablets every 12 h for one day at onset of cold core (Patient not taking: Reported on 05/08/2016), Disp: 12 tablet, Rfl: 1  Review of Systems  Constitutional: Negative.   Respiratory: Negative.   Cardiovascular: Negative.   Gastrointestinal: Negative.   Neurological:  Negative.   Psychiatric/Behavioral: Negative.     Social History  Substance Use Topics  . Smoking status: Never Smoker  . Smokeless tobacco: Never Used  . Alcohol use Yes   Objective:   BP 120/80 (BP Location: Left Arm, Patient Position: Sitting, Cuff Size: Large)   Pulse 86   Temp 98.2 F (36.8 C) (Oral)   Resp 16   Ht 5' 5.5" (1.664 m)   Wt 204 lb (92.5 kg)   SpO2 97%   BMI 33.43 kg/m   Physical Exam  Constitutional: She appears well-developed and well-nourished. No distress.  Neck: Normal range of motion. Neck supple. No tracheal deviation present. No thyromegaly present.  Cardiovascular: Normal rate, regular rhythm and normal heart sounds.  Exam reveals no gallop and no friction rub.   No murmur heard. Pulmonary/Chest: Effort normal and breath sounds normal. No respiratory distress. She has no wheezes. She has no rales.  Lymphadenopathy:    She has no cervical adenopathy.  Skin: She is not diaphoretic.  Psychiatric: She has a normal mood and affect. Her behavior is normal. Judgment and thought content normal.  Vitals reviewed.      Assessment & Plan:     1. Encounter for weight loss counseling The patient does have motivation to start a weight loss journey. I have advised her to try to begin a calorie restricted diet using a food diary and limit to 1200 cal per day. I also instructed her to try to get on a more formal  exercise regimen. I will see her back in 4 weeks to check her progress. If she is doing well on her own and has a good platform to start we may consider adding phentermine for appetite suppression.  2. Class 1 obesity due to excess calories without serious comorbidity with body mass index (BMI) of 33.0 to 33.9 in adult See above medical treatment plan.  The entirety of the information documented in the History of Present Illness, Review of Systems and Physical Exam were personally obtained by me. Portions of this information were initially documented by  Lyndel Pleasure, CMA and reviewed by me for thoroughness and accuracy.  This office note has been dictated using dragon voice recognition so please excuse any grammatical or typographical errors.      Mar Daring, PA-C  [facility]

## 2016-05-29 ENCOUNTER — Ambulatory Visit (INDEPENDENT_AMBULATORY_CARE_PROVIDER_SITE_OTHER): Payer: BC Managed Care – PPO | Admitting: Physician Assistant

## 2016-05-29 ENCOUNTER — Encounter: Payer: Self-pay | Admitting: Physician Assistant

## 2016-05-29 VITALS — BP 128/80 | HR 84 | Temp 98.1°F | Resp 16 | Wt 199.4 lb

## 2016-05-29 DIAGNOSIS — Z6832 Body mass index (BMI) 32.0-32.9, adult: Secondary | ICD-10-CM

## 2016-05-29 DIAGNOSIS — E6609 Other obesity due to excess calories: Secondary | ICD-10-CM

## 2016-05-29 DIAGNOSIS — Z713 Dietary counseling and surveillance: Secondary | ICD-10-CM

## 2016-05-29 MED ORDER — PHENTERMINE HCL 37.5 MG PO TABS: 37.5000 mg | ORAL_TABLET | Freq: Every day | ORAL | 0 refills | Status: DC

## 2016-05-29 NOTE — Progress Notes (Signed)
       Patient: Jody Nolan Female    DOB: [birth year]   51 y.o.   MRN: [id number] Visit Date: 05/29/2016  Today's Provider: Mar Daring, PA-C   Chief Complaint  Patient presents with  . Follow-up    weight Loss Counseling   Subjective:    HPI Patient comes in today to follow-up on weight loss counseling. She is using my fitness pal and her fitbit and is getting 10,000 steps. Patient's weight int he last office visit was 204 lbs and today her weight is 199.4 lbs, 5 lbs in 3 weeks. She also cut out all sodas.   No Known Allergies   Current Outpatient Prescriptions:  .  albuterol (PROVENTIL HFA;VENTOLIN HFA) 108 (90 Base) MCG/ACT inhaler, Inhale 2 puffs into the lungs every 6 (six) hours as needed for wheezing or shortness of breath., Disp: 1 Inhaler, Rfl: 2 .  Cetirizine HCl (ZYRTEC ALLERGY) 10 MG CAPS, Take 1 tablet by mouth daily., Disp: , Rfl:  .  valACYclovir (VALTREX) 1000 MG tablet, Take two tablets every 12 h for one day at onset of cold core, Disp: 12 tablet, Rfl: 1  Review of Systems  Constitutional: Negative.   Respiratory: Negative.   Cardiovascular: Negative for chest pain, palpitations and leg swelling.  Gastrointestinal: Negative.   Neurological: Negative.   Psychiatric/Behavioral: Negative.     Social History  Substance Use Topics  . Smoking status: Never Smoker  . Smokeless tobacco: Never Used  . Alcohol use Yes   Objective:   BP 128/80 (BP Location: Right Arm, Patient Position: Sitting, Cuff Size: Normal)   Pulse 84   Temp 98.1 F (36.7 C) (Oral)   Resp 16   Wt 199 lb 6.4 oz (90.4 kg)   BMI 32.68 kg/m   Physical Exam  Constitutional: She appears well-developed and well-nourished. No distress.  Neck: Normal range of motion. Neck supple. No JVD present.  Cardiovascular: Normal rate, regular rhythm and normal heart sounds.  Exam reveals no gallop and no friction rub.   No murmur heard. Pulmonary/Chest: Effort normal and breath sounds  normal. No respiratory distress. She has no wheezes. She has no rales.  Skin: She is not diaphoretic.  Vitals reviewed.      Assessment & Plan:     1. Encounter for weight loss counseling Patient has done very well on her own and showed motivation for weight loss. She is to continue the lifestyle changes she has started. Will add phentermine for appetite suppression. I will see her back in 4 weeks to check her progress.  - phentermine (ADIPEX-P) 37.5 MG tablet; Take 1 tablet (37.5 mg total) by mouth daily before breakfast.  Dispense: 30 tablet; Refill: 0  2. Class 1 obesity due to excess calories without serious comorbidity with body mass index (BMI) of 32.0 to 32.9 in adult See above medical treatment plan. - phentermine (ADIPEX-P) 37.5 MG tablet; Take 1 tablet (37.5 mg total) by mouth daily before breakfast.  Dispense: 30 tablet; Refill: 0       Mar Daring, PA-C  [facility]

## 2016-05-29 NOTE — Patient Instructions (Signed)

## 2016-06-26 ENCOUNTER — Encounter: Payer: Self-pay | Admitting: Physician Assistant

## 2016-06-26 ENCOUNTER — Ambulatory Visit (INDEPENDENT_AMBULATORY_CARE_PROVIDER_SITE_OTHER): Payer: BC Managed Care – PPO | Admitting: Physician Assistant

## 2016-06-26 VITALS — BP 110/80 | HR 100 | Temp 98.0°F | Resp 16 | Ht 66.0 in | Wt 189.6 lb

## 2016-06-26 DIAGNOSIS — J4541 Moderate persistent asthma with (acute) exacerbation: Secondary | ICD-10-CM

## 2016-06-26 DIAGNOSIS — E6609 Other obesity due to excess calories: Secondary | ICD-10-CM | POA: Diagnosis not present

## 2016-06-26 DIAGNOSIS — Z713 Dietary counseling and surveillance: Secondary | ICD-10-CM | POA: Diagnosis not present

## 2016-06-26 DIAGNOSIS — Z6832 Body mass index (BMI) 32.0-32.9, adult: Secondary | ICD-10-CM

## 2016-06-26 MED ORDER — HYDROCOD POLST-CPM POLST ER 10-8 MG/5ML PO SUER: 5.0000 mL | Freq: Two times a day (BID) | ORAL | 0 refills | Status: DC | PRN

## 2016-06-26 MED ORDER — PHENTERMINE HCL 37.5 MG PO TABS: 37.5000 mg | ORAL_TABLET | Freq: Every day | ORAL | 2 refills | Status: DC

## 2016-06-26 MED ORDER — AZITHROMYCIN 250 MG PO TABS: ORAL_TABLET | ORAL | 0 refills | Status: DC

## 2016-06-26 MED ORDER — PREDNISONE 10 MG (21) PO TBPK: ORAL_TABLET | ORAL | 0 refills | Status: DC

## 2016-06-26 NOTE — Patient Instructions (Signed)
Exercising to Lose Weight Exercising can help you to lose weight. In order to lose weight through exercise, you need to do vigorous-intensity exercise. You can tell that you are exercising with vigorous intensity if you are breathing very hard and fast and cannot hold a conversation while exercising. Moderate-intensity exercise helps to maintain your current weight. You can tell that you are exercising at a moderate level if you have a higher heart rate and faster breathing, but you are still able to hold a conversation. How often should I exercise? Choose an activity that you enjoy and set realistic goals. Your health care provider can help you to make an activity plan that works for you. Exercise regularly as directed by your health care provider. This may include:  Doing resistance training twice each week, such as:  Push-ups.  Sit-ups.  Lifting weights.  Using resistance bands.  Doing a given intensity of exercise for a given amount of time. Choose from these options:  150 minutes of moderate-intensity exercise every week.  75 minutes of vigorous-intensity exercise every week.  A mix of moderate-intensity and vigorous-intensity exercise every week. Children, pregnant women, people who are out of shape, people who are overweight, and older adults may need to consult a health care provider for individual recommendations. If you have any sort of medical condition, be sure to consult your health care provider before starting a new exercise program. What are some activities that can help me to lose weight?  Walking at a rate of at least 4.5 miles an hour.  Jogging or running at a rate of 5 miles per hour.  Biking at a rate of at least 10 miles per hour.  Lap swimming.  Roller-skating or in-line skating.  Cross-country skiing.  Vigorous competitive sports, such as football, basketball, and soccer.  Jumping rope.  Aerobic dancing. How can I be more active in my day-to-day  activities?  Use the stairs instead of the elevator.  Take a walk during your lunch break.  If you drive, park your car farther away from work or school.  If you take public transportation, get off one stop early and walk the rest of the way.  Make all of your phone calls while standing up and walking around.  Get up, stretch, and walk around every 30 minutes throughout the day. What guidelines should I follow while exercising?  Do not exercise so much that you hurt yourself, feel dizzy, or get very short of breath.  Consult your health care provider prior to starting a new exercise program.  Wear comfortable clothes and shoes with good support.  Drink plenty of water while you exercise to prevent dehydration or heat stroke. Body water is lost during exercise and must be replaced.  Work out until you breathe faster and your heart beats faster. This information is not intended to replace advice given to you by your health care provider. Make sure you discuss any questions you have with your health care provider. Document Released: 05/13/2010 Document Revised: 09/16/2015 Document Reviewed: 09/11/2013 Elsevier Interactive Patient Education  2017 [employer]   Bronchospasm, Adult Bronchospasm is a tightening of the airways going into the lungs. During an episode, it may be harder to breathe. You may cough, and you may make a whistling sound when you breathe (wheeze). This condition often affects people with asthma. What are the causes? This condition is caused by swelling and irritation in the airways. It can be triggered by:  An infection (common).  Seasonal  allergies.  An allergic reaction.  Exercise.  Irritants. These include pollution, cigarette smoke, strong odors, aerosol sprays, and paint fumes.  Weather changes. Winds increase molds and pollens in the air. Cold air may cause swelling.  Stress and emotional upset. What are the signs or symptoms? Symptoms of this  condition include:  Wheezing. If the episode was triggered by an allergy, wheezing may start right away or hours later.  Nighttime coughing.  Frequent or severe coughing with a simple cold.  Chest tightness.  Shortness of breath.  Decreased ability to exercise. How is this diagnosed? This condition is usually diagnosed with a review of your medical history and a physical exam. Tests, such as lung function tests, are sometimes done to look for other conditions. The need for a chest X-ray depends on where the wheezing occurs and whether it is the first time you have wheezed. How is this treated? This condition may be treated with:  Inhaled medicines. These open up the airways and help you breathe. They can be taken with an inhaler or a nebulizer device.  Corticosteroid medicines. These may be given for severe bronchospasm, usually when it is associated with asthma.  Avoiding triggers, such as irritants, infection, or allergies. Follow these instructions at home: Medicines   Take over-the-counter and prescription medicines only as told by your health care provider.  If you need to use an inhaler or nebulizer to take your medicine, ask your health care provider to explain how to use it correctly. If you were given a spacer, always use it with your inhaler. Lifestyle   Reduce the number of triggers in your home. To do this:  Change your heating and air conditioning filter at least once a month.  Limit your use of fireplaces and wood stoves.  Do not smoke. Do not allow smoking in your home.  Avoid using perfumes and fragrances.  Get rid of pests, such as roaches and mice, and their droppings.  Remove any mold from your home.  Keep your house clean and dust free. Use unscented cleaning products.  Replace carpet with wood, tile, or vinyl flooring. Carpet can trap dander and dust.  Use allergy-proof pillows, mattress covers, and box spring covers.  Wash bed sheets and  blankets every week in hot water. Dry them in a dryer.  Use blankets that are made of polyester or cotton.  Wash your hands often.  Do not allow pets in your bedroom.  Avoid breathing in cold air when you exercise. General instructions   Have a plan for seeking medical care. Know when to call your health care provider and local emergency services, and where to get emergency care.  Stay up to date on your immunizations.  When you have an episode of bronchospasm, stay calm. Try to relax and breathe more slowly.  If you have asthma, make sure you have an asthma action plan.  Keep all follow-up visits as told by your health care provider. This is important. Contact a health care provider if:  You have muscle aches.  You have chest pain.  The mucus that you cough up (sputum) changes from clear or white to yellow, green, gray, or bloody.  You have a fever.  Your sputum gets thicker. Get help right away if:  Your wheezing and coughing get worse, even after you take your prescribed medicines.  It gets even harder to breathe.  You develop severe chest pain. Summary  Bronchospasm is a tightening of the airways going into the  lungs.  During an episode of bronchospasm, you may have a harder time breathing. You may cough and make a whistling sound when you breathe (wheeze).  Avoid exposure to triggers such as smoke, dust, mold, animal dander, and fragrances.  When you have an episode of bronchospasm, stay calm. Try to relax and breathe more slowly. This information is not intended to replace advice given to you by your health care provider. Make sure you discuss any questions you have with your health care provider. Document Released: 04/13/2003 Document Revised: 04/06/2016 Document Reviewed: 04/06/2016 Elsevier Interactive Patient Education  2017 [employer].

## 2016-06-26 NOTE — Progress Notes (Signed)
Patient: Jody Nolan Female    DOB: 09-14-1965   51 y.o.   MRN: [id number] Visit Date: 06/26/2016  Today's Provider: Mar Daring, PA-C   Chief Complaint  Patient presents with  . Obesity   Subjective:    HPI  Follow up for weight  The patient was last seen for this 1 months ago. Changes made at last visit include no changes continue phentermine 37.5 mg.  She reports excellent compliance with treatment. She feels that condition is Improved. She is not having side effects.   Wt Readings from Last 3 Encounters:  06/26/16 189 lb 9.6 oz (86 kg)  05/29/16 199 lb 6.4 oz (90.4 kg)  05/08/16 204 lb (92.5 kg)   ------------------------------------------------------------------------------------  Cough: Patient c/o cough x's 1 1/2 week. Patient reports she has been using her albuterol at least once a day over the weekend and twice during last week, pt reports mild symptoms control. Patient reports her cough is worse during the night and early morning.    Depression screen [facility] 2/9 06/26/2016  Decreased Interest 0  Down, Depressed, Hopeless 0  PHQ - 2 Score 0  Altered sleeping 0  Tired, decreased energy 0  Change in appetite 0  Feeling bad or failure about yourself  0  Trouble concentrating 0  Moving slowly or fidgety/restless 0  Suicidal thoughts 0  PHQ-9 Score 0  Difficult doing work/chores Not difficult at all    No Known Allergies   Current Outpatient Prescriptions:  .  albuterol (PROVENTIL HFA;VENTOLIN HFA) 108 (90 Base) MCG/ACT inhaler, Inhale 2 puffs into the lungs every 6 (six) hours as needed for wheezing or shortness of breath., Disp: 1 Inhaler, Rfl: 2 .  Cetirizine HCl (ZYRTEC ALLERGY) 10 MG CAPS, Take 1 tablet by mouth daily., Disp: , Rfl:  .  phentermine (ADIPEX-P) 37.5 MG tablet, Take 1 tablet (37.5 mg total) by mouth daily before breakfast., Disp: 30 tablet, Rfl: 0 .  valACYclovir (VALTREX) 1000 MG tablet, Take two tablets every 12 h for one day  at onset of cold core (Patient taking differently: as needed. Take two tablets every 12 h for one day at onset of cold core), Disp: 12 tablet, Rfl: 1  Review of Systems  Constitutional: Negative.   HENT: Positive for congestion, postnasal drip and sinus pressure. Negative for ear pain, rhinorrhea, sinus pain, sore throat (scracthy), tinnitus and trouble swallowing.   Respiratory: Positive for cough, chest tightness, shortness of breath and wheezing.   Cardiovascular: Negative.   Gastrointestinal: Negative.   Neurological: Negative for dizziness and headaches.  Psychiatric/Behavioral: Negative.     Social History  Substance Use Topics  . Smoking status: Never Smoker  . Smokeless tobacco: Never Used  . Alcohol use Yes   Objective:   BP 110/80 (BP Location: Left Arm, Patient Position: Sitting, Cuff Size: Large)   Pulse 100   Temp 98 F (36.7 C) (Oral)   Resp 16   Ht 5\' 6"  (1.676 m)   Wt 189 lb 9.6 oz (86 kg)   SpO2 98%   BMI 30.60 kg/m  Vitals:   06/26/16 1634  BP: 110/80  Pulse: 100  Resp: 16  Temp: 98 F (36.7 C)  TempSrc: Oral  SpO2: 98%  Weight: 189 lb 9.6 oz (86 kg)  Height: 5\' 6"  (1.676 m)     Physical Exam  Constitutional: She appears well-developed and well-nourished. No distress.  HENT:  Head: Normocephalic and atraumatic.  Right Ear: Hearing, tympanic  membrane, external ear and ear canal normal.  Left Ear: Hearing, tympanic membrane, external ear and ear canal normal.  Nose: Mucosal edema present. No rhinorrhea. Right sinus exhibits no maxillary sinus tenderness and no frontal sinus tenderness. Left sinus exhibits no maxillary sinus tenderness and no frontal sinus tenderness.  Mouth/Throat: Uvula is midline and mucous membranes are normal. No oropharyngeal exudate, posterior oropharyngeal edema or posterior oropharyngeal erythema.  Eyes: Conjunctivae are normal. Pupils are equal, round, and reactive to light. Right eye exhibits no discharge. Left eye exhibits  no discharge. No scleral icterus.  Neck: Normal range of motion. Neck supple. No tracheal deviation present. No thyromegaly present.  Cardiovascular: Normal rate, regular rhythm and normal heart sounds.  Exam reveals no gallop and no friction rub.   No murmur heard. Pulmonary/Chest: Effort normal. No stridor. No respiratory distress. She has no decreased breath sounds. She has wheezes (faint expiratory throughout). She has no rhonchi. She has no rales.  Lymphadenopathy:    She has no cervical adenopathy.  Skin: Skin is warm and dry. She is not diaphoretic.  Psychiatric: She has a normal mood and affect. Her behavior is normal. Judgment and thought content normal.  Vitals reviewed.      Assessment & Plan:     1. Encounter for weight loss counseling She continues to do very well with weight loss. She lost 10 more pounds with a total of 15 pounds lost. She reports this was only with dieting and changing how she was eating. She is doing a food diary. She reports she is now going to start adding in exercise and toning. Will continue phentermine as below for appetite suppression. I will see her back in 3 months to check her progress.  - phentermine (ADIPEX-P) 37.5 MG tablet; Take 1 tablet (37.5 mg total) by mouth daily before breakfast.  Dispense: 30 tablet; Refill: 2  2. Class 1 obesity due to excess calories without serious comorbidity with body mass index (BMI) of 32.0 to 32.9 in adult See above medical treatment plan. - phentermine (ADIPEX-P) 37.5 MG tablet; Take 1 tablet (37.5 mg total) by mouth daily before breakfast.  Dispense: 30 tablet; Refill: 2  3. Moderate persistent asthmatic bronchitis with acute exacerbation Worsening symptoms. Has failed albuterol inhaler. Will give prednisone and Zpak as below. Continue allergy medications. Push fluids. Tussionex cough syrup given for cough suppression. She is to call if symptoms do not resolve or if the worsen.  - predniSONE (STERAPRED UNI-PAK 21  TAB) 10 MG (21) TBPK tablet; Take as directed on package instructions  Dispense: 21 tablet; Refill: 0 - azithromycin (ZITHROMAX) 250 MG tablet; Take 2 tablets PO on day one, and one tablet PO daily thereafter until completed.  Dispense: 6 tablet; Refill: 0 - chlorpheniramine-HYDROcodone (TUSSIONEX PENNKINETIC ER) 10-8 MG/5ML SUER; Take 5 mLs by mouth every 12 (twelve) hours as needed for cough.  Dispense: 140 mL; Refill: 0       Mar Daring, PA-C  [facility]

## 2016-09-27 ENCOUNTER — Encounter: Payer: Self-pay | Admitting: Physician Assistant

## 2016-09-27 ENCOUNTER — Ambulatory Visit (INDEPENDENT_AMBULATORY_CARE_PROVIDER_SITE_OTHER): Payer: BC Managed Care – PPO | Admitting: Physician Assistant

## 2016-09-27 VITALS — BP 120/86 | HR 66 | Temp 98.0°F | Resp 16 | Ht 66.0 in | Wt 171.0 lb

## 2016-09-27 DIAGNOSIS — Z6832 Body mass index (BMI) 32.0-32.9, adult: Secondary | ICD-10-CM | POA: Diagnosis not present

## 2016-09-27 DIAGNOSIS — E6609 Other obesity due to excess calories: Secondary | ICD-10-CM | POA: Diagnosis not present

## 2016-09-27 DIAGNOSIS — Z713 Dietary counseling and surveillance: Secondary | ICD-10-CM | POA: Diagnosis not present

## 2016-09-27 MED ORDER — PHENTERMINE HCL 37.5 MG PO TABS: 37.5000 mg | ORAL_TABLET | Freq: Every day | ORAL | 2 refills | Status: DC

## 2016-09-27 NOTE — Patient Instructions (Signed)

## 2016-09-27 NOTE — Progress Notes (Signed)
Patient: Jody Nolan Female    DOB: November 25, 1965   51 y.o.   MRN: [id number] Visit Date: 09/27/2016  Today's Provider: Mar Daring, PA-C   Chief Complaint  Patient presents with  . Follow-up   Subjective:    HPI  Follow up for weight loss counseling  The patient was last seen for this 3 months ago. Changes made at last visit include no changes.  She reports excellent compliance with treatment. She feels that condition is Improved. She is not having side effects.  Wt Readings from Last 3 Encounters:  09/27/16 171 lb (77.6 kg)  06/26/16 189 lb 9.6 oz (86 kg)  05/29/16 199 lb 6.4 oz (90.4 kg)   Current Exercise Habits: Structured exercise class, Type of exercise: walking;strength training/weights, Time (Minutes): 60, Frequency (Times/Week): 4, Weekly Exercise (Minutes/Week): 240, Intensity: Moderate   ------------------------------------------------------------------------------------       No Known Allergies   Current Outpatient Prescriptions:  .  albuterol (PROVENTIL HFA;VENTOLIN HFA) 108 (90 Base) MCG/ACT inhaler, Inhale 2 puffs into the lungs every 6 (six) hours as needed for wheezing or shortness of breath., Disp: 1 Inhaler, Rfl: 2 .  Cetirizine HCl (ZYRTEC ALLERGY) 10 MG CAPS, Take 1 tablet by mouth daily., Disp: , Rfl:  .  phentermine (ADIPEX-P) 37.5 MG tablet, Take 1 tablet (37.5 mg total) by mouth daily before breakfast., Disp: 30 tablet, Rfl: 2 .  valACYclovir (VALTREX) 1000 MG tablet, Take two tablets every 12 h for one day at onset of cold core (Patient taking differently: as needed. Take two tablets every 12 h for one day at onset of cold core), Disp: 12 tablet, Rfl: 1  Review of Systems  Constitutional: Negative.   HENT: Negative.   Respiratory: Negative.   Cardiovascular: Negative.   Gastrointestinal: Negative.   Neurological: Negative.     Social History  Substance Use Topics  . Smoking status: Never Smoker  . Smokeless tobacco:  Never Used  . Alcohol use Yes   Objective:   BP 120/86 (BP Location: Left Arm, Patient Position: Sitting, Cuff Size: Large)   Pulse 66   Temp 98 F (36.7 C) (Oral)   Resp 16   Ht 5\' 6"  (1.676 m)   Wt 171 lb (77.6 kg)   SpO2 99%   BMI 27.60 kg/m  Vitals:   09/27/16 1638  BP: 120/86  Pulse: 66  Resp: 16  Temp: 98 F (36.7 C)  TempSrc: Oral  SpO2: 99%  Weight: 171 lb (77.6 kg)  Height: 5\' 6"  (1.676 m)     Physical Exam  Constitutional: She appears well-developed and well-nourished. No distress.  Neck: Normal range of motion. Neck supple.  Cardiovascular: Normal rate, regular rhythm and normal heart sounds.  Exam reveals no gallop and no friction rub.   No murmur heard. Pulmonary/Chest: Effort normal and breath sounds normal. No respiratory distress. She has no wheezes. She has no rales.  Skin: She is not diaphoretic.  Psychiatric: She has a normal mood and affect. Her behavior is normal. Judgment and thought content normal.  Vitals reviewed.      Assessment & Plan:     1. Encounter for weight loss counseling Patient continues to do very well with her weight loss. She states that she is continuing to use myfitnesspal to monitor caloric intake and is exercising everyday. She reports that she has great family support and the whole family is making healthier lifestyle modifications which is helping to motivate her as well.  I will see her back in 3 months for weight recheck. - phentermine (ADIPEX-P) 37.5 MG tablet; Take 1 tablet (37.5 mg total) by mouth daily before breakfast.  Dispense: 30 tablet; Refill: 2  2. Class 1 obesity due to excess calories without serious comorbidity with body mass index (BMI) of 32.0 to 32.9 in adult See above medical treatment plan. - phentermine (ADIPEX-P) 37.5 MG tablet; Take 1 tablet (37.5 mg total) by mouth daily before breakfast.  Dispense: 30 tablet; Refill: [place], PA-C  [facility]

## 2016-12-27 ENCOUNTER — Encounter: Payer: Self-pay | Admitting: Physician Assistant

## 2016-12-27 ENCOUNTER — Ambulatory Visit (INDEPENDENT_AMBULATORY_CARE_PROVIDER_SITE_OTHER): Payer: BC Managed Care – PPO | Admitting: Physician Assistant

## 2016-12-27 VITALS — BP 120/70 | HR 100 | Temp 98.4°F | Resp 16 | Ht 66.0 in | Wt 165.0 lb

## 2016-12-27 DIAGNOSIS — Z713 Dietary counseling and surveillance: Secondary | ICD-10-CM | POA: Diagnosis not present

## 2016-12-27 DIAGNOSIS — R220 Localized swelling, mass and lump, head: Secondary | ICD-10-CM | POA: Diagnosis not present

## 2016-12-27 DIAGNOSIS — E663 Overweight: Secondary | ICD-10-CM

## 2016-12-27 MED ORDER — PHENTERMINE HCL 37.5 MG PO TABS: 37.5000 mg | ORAL_TABLET | Freq: Every day | ORAL | 2 refills | Status: DC

## 2016-12-27 NOTE — Progress Notes (Signed)
Patient: Jody Nolan Female    DOB: 07-01-1965   51 y.o.   MRN: [id number] Visit Date: 12/27/2016  Today's Provider: Mar Daring, PA-C   Chief Complaint  Patient presents with  . Follow-up   Subjective:    HPI  Follow up for weight  The patient was last seen for this 3 months ago. Changes made at last visit include no changes patient advised to continue diet and exercise.  She reports excellent compliance with treatment. She feels that condition is Unchanged. She is not having side effects.   Wt Readings from Last 3 Encounters:  12/27/16 165 lb (74.8 kg)  09/27/16 171 lb (77.6 kg)  06/26/16 189 lb 9.6 oz (86 kg)   Current Exercise Habits: The patient does not participate in regular exercise at present   ------------------------------------------------------------------------------------  Patient C/O pain and swelling on left side of face, ear, eye and throat this morning. Patient reports symptoms have improved. Patient does work at a school.      No Known Allergies   Current Outpatient Prescriptions:  .  albuterol (PROVENTIL HFA;VENTOLIN HFA) 108 (90 Base) MCG/ACT inhaler, Inhale 2 puffs into the lungs every 6 (six) hours as needed for wheezing or shortness of breath., Disp: 1 Inhaler, Rfl: 2 .  Cetirizine HCl (ZYRTEC ALLERGY) 10 MG CAPS, Take 1 tablet by mouth daily., Disp: , Rfl:  .  phentermine (ADIPEX-P) 37.5 MG tablet, Take 1 tablet (37.5 mg total) by mouth daily before breakfast., Disp: 30 tablet, Rfl: 2 .  valACYclovir (VALTREX) 1000 MG tablet, Take two tablets every 12 h for one day at onset of cold core (Patient taking differently: as needed. Take two tablets every 12 h for one day at onset of cold core), Disp: 12 tablet, Rfl: 1  Review of Systems  Constitutional: Negative for appetite change, chills, fatigue and fever.  HENT: Positive for congestion, ear pain, facial swelling and sore throat. Negative for dental problem, ear discharge, hearing  loss, postnasal drip, rhinorrhea, sinus pain and sinus pressure.   Eyes: Positive for pain, redness and itching.  Respiratory: Negative for cough, chest tightness, shortness of breath and wheezing.   Cardiovascular: Negative for chest pain, palpitations and leg swelling.  Gastrointestinal: Negative for abdominal pain and nausea.  Neurological: Negative for dizziness, light-headedness and headaches.    Social History  Substance Use Topics  . Smoking status: Never Smoker  . Smokeless tobacco: Never Used  . Alcohol use Yes   Objective:   BP 120/70 (BP Location: Right Arm, Patient Position: Sitting, Cuff Size: Large)   Pulse 100   Temp 98.4 F (36.9 C) (Oral)   Resp 16   Ht 5\' 6"  (1.676 m)   Wt 165 lb (74.8 kg)   SpO2 99%   BMI 26.63 kg/m  Vitals:   12/27/16 1630  BP: 120/70  Pulse: 100  Resp: 16  Temp: 98.4 F (36.9 C)  TempSrc: Oral  SpO2: 99%  Weight: 165 lb (74.8 kg)  Height: 5\' 6"  (1.676 m)     Physical Exam  Constitutional: She appears well-developed and well-nourished. No distress.  HENT:  Head: Normocephalic and atraumatic.  Right Ear: Hearing, tympanic membrane, external ear and ear canal normal.  Left Ear: Hearing, tympanic membrane, external ear and ear canal normal.  Nose: Nose normal.  Mouth/Throat: Uvula is midline, oropharynx is clear and moist and mucous membranes are normal. No oropharyngeal exudate.  Eyes: Pupils are equal, round, and reactive to light. Conjunctivae  are normal. Right eye exhibits no discharge. Left eye exhibits no discharge. No scleral icterus.  Neck: Normal range of motion. Neck supple. No JVD present. No tracheal deviation present. No thyromegaly present.  Cardiovascular: Normal rate, regular rhythm and normal heart sounds.  Exam reveals no gallop and no friction rub.   No murmur heard. Pulmonary/Chest: Effort normal and breath sounds normal. No stridor. No respiratory distress. She has no wheezes. She has no rales.    Lymphadenopathy:    She has no cervical adenopathy.  Skin: Skin is warm and dry. She is not diaphoretic.  Vitals reviewed.      Assessment & Plan:     1. Encounter for weight loss counseling Patient continues to do well and is almost to normal BMI. Continue decreased portion sizes and exercise. Discussed that since she is close to normal BMI she should use this last Rx to start tapering down off the phentermine. She is to go to one tab every other day then the last month take 1/2 tab PO every other day. I will see her back in 3 months to see how she is doing and we will discontinue at that time.  - phentermine (ADIPEX-P) 37.5 MG tablet; Take 1 tablet (37.5 mg total) by mouth daily before breakfast.  Dispense: 30 tablet; Refill: 2  2. Overweight (BMI 25.0-29.9) See above medical treatment plan. - phentermine (ADIPEX-P) 37.5 MG tablet; Take 1 tablet (37.5 mg total) by mouth daily before breakfast.  Dispense: 30 tablet; Refill: 2  3. Left facial swelling Unsure of cause. Suspect possible gum infection that cleared on its own. Also possible it may have been a blocked salivary duct. Advised patient to call and take pictures if it occurs again.        Mar Daring, PA-C  [facility]

## 2017-01-23 ENCOUNTER — Ambulatory Visit (INDEPENDENT_AMBULATORY_CARE_PROVIDER_SITE_OTHER): Payer: BC Managed Care – PPO | Admitting: Physician Assistant

## 2017-01-23 ENCOUNTER — Encounter: Payer: Self-pay | Admitting: Physician Assistant

## 2017-01-23 VITALS — BP 118/80 | HR 96 | Temp 97.8°F | Resp 16 | Wt 167.0 lb

## 2017-01-23 DIAGNOSIS — J0110 Acute frontal sinusitis, unspecified: Secondary | ICD-10-CM | POA: Diagnosis not present

## 2017-01-23 DIAGNOSIS — R062 Wheezing: Secondary | ICD-10-CM | POA: Diagnosis not present

## 2017-01-23 MED ORDER — AMOXICILLIN-POT CLAVULANATE 875-125 MG PO TABS: 1.0000 | ORAL_TABLET | Freq: Two times a day (BID) | ORAL | 0 refills | Status: AC

## 2017-01-23 MED ORDER — ALBUTEROL SULFATE HFA 108 (90 BASE) MCG/ACT IN AERS: 2.0000 | INHALATION_SPRAY | Freq: Four times a day (QID) | RESPIRATORY_TRACT | 2 refills | Status: DC | PRN

## 2017-01-23 NOTE — Progress Notes (Signed)
[place]  Chief Complaint  Patient presents with  . URI    Started over a week ago    Subjective:    Patient ID: Jody Nolan, female    DOB: 02-Apr-1966, 51 y.o.   MRN: [id number]  Upper Respiratory Infection: Jody Nolan is a 51 y.o. female  complaining of symptoms of a URI, possible sinusitis. Symptoms include congestion, cough, sore throat and swollen glands. Onset of symptoms was 10 days ago, gradually worsening since that time. She also c/o congestion, cough described as productive, lightheadedness and post nasal drip for the past several days .  She is not drinking much. Evaluation to date: none. Treatment to date: antihistamines, cough suppressants and decongestants. The treatment has provided minimal.   Review of Systems  Constitutional: Positive for chills and fatigue. Negative for activity change, appetite change, diaphoresis, fever and unexpected weight change.  HENT: Positive for congestion, postnasal drip, rhinorrhea, sinus pain, sinus pressure, sneezing, sore throat and voice change. Negative for ear discharge, ear pain, hearing loss, nosebleeds, tinnitus and trouble swallowing.   Eyes: Negative.   Respiratory: Positive for cough, chest tightness, shortness of breath and wheezing. Negative for apnea, choking and stridor.   Gastrointestinal: Positive for nausea.  Neurological: Positive for dizziness, light-headedness and headaches.       Objective:   BP 118/80 (BP Location: Right Arm, Patient Position: Sitting, Cuff Size: Normal)   Pulse 96   Temp 97.8 F (36.6 C) (Oral)   Resp 16   Wt 167 lb (75.8 kg)   BMI 26.95 kg/m   Patient Active Problem List   Diagnosis Date Noted  . Allergic rhinitis 01/17/2016  . Degeneration of cervical intervertebral disc 01/17/2016  . Dysfunctional uterine bleeding 01/17/2016  . Family history of GI tract cancer 01/17/2016  . Vaginal Pap smear, abnormal 01/17/2016  . Radiculopathy  01/17/2016  . Family history of colon cancer 07/19/2009  . Uncomplicated herpes simplex 07/19/2009    Outpatient Encounter Prescriptions as of 01/23/2017  Medication Sig Note  . albuterol (PROVENTIL HFA;VENTOLIN HFA) 108 (90 Base) MCG/ACT inhaler Inhale 2 puffs into the lungs every 6 (six) hours as needed for wheezing or shortness of breath.   . Cetirizine HCl (ZYRTEC ALLERGY) 10 MG CAPS Take 1 tablet by mouth daily. 01/17/2016: Received from: [facility]: ZYRTEC ALLERGY, 10MG  (Oral Capsule) - Historical Medication  1 daily (10 MG) Active  . phentermine (ADIPEX-P) 37.5 MG tablet Take 1 tablet (37.5 mg total) by mouth daily before breakfast.   . valACYclovir (VALTREX) 1000 MG tablet Take two tablets every 12 h for one day at onset of cold core (Patient taking differently: as needed. Take two tablets every 12 h for one day at onset of cold core)    No facility-administered encounter medications on file as of 01/23/2017.     No Known Allergies     Physical Exam  Constitutional: She is oriented to person, place, and time. She appears well-developed and well-nourished. No distress.  HENT:  Right Ear: External ear normal.  Left Ear: External ear normal.  Nose: Right sinus exhibits maxillary sinus tenderness and frontal sinus tenderness. Left sinus exhibits maxillary sinus tenderness and frontal sinus tenderness.  Mouth/Throat: Oropharynx is clear and moist. No oropharyngeal exudate, posterior oropharyngeal edema or posterior oropharyngeal erythema.  Tms opaque bilaterally   Eyes: Conjunctivae are normal. Right eye exhibits no discharge. Left eye exhibits no discharge.  Neck: Neck supple.  Cardiovascular: Normal rate and regular  rhythm.   Pulmonary/Chest: Effort normal and breath sounds normal.  Lymphadenopathy:    She has cervical adenopathy.  Neurological: She is alert and oriented to person, place, and time.  Skin: Skin is warm and dry. She is not  diaphoretic.  Psychiatric: She has a normal mood and affect. Her behavior is normal.       Assessment & Plan:  1. Acute non-recurrent frontal sinusitis  - amoxicillin-clavulanate (AUGMENTIN) 875-125 MG tablet; Take 1 tablet by mouth 2 (two) times daily.  Dispense: 20 tablet; Refill: 0  2. Wheezes  - albuterol (PROVENTIL HFA;VENTOLIN HFA) 108 (90 Base) MCG/ACT inhaler; Inhale 2 puffs into the lungs every 6 (six) hours as needed for wheezing or shortness of breath.  Dispense: 1 Inhaler; Refill: 2   The entirety of the information documented in the History of Present Illness, Review of Systems and Physical Exam were personally obtained by me. Portions of this information were initially documented by Jody Nolan, CMA and reviewed by me for thoroughness and accuracy.   Return if symptoms worsen or fail to improve.

## 2017-01-23 NOTE — Patient Instructions (Signed)

## 2017-03-02 ENCOUNTER — Ambulatory Visit (INDEPENDENT_AMBULATORY_CARE_PROVIDER_SITE_OTHER): Payer: BC Managed Care – PPO | Admitting: Physician Assistant

## 2017-03-02 ENCOUNTER — Encounter: Payer: Self-pay | Admitting: Physician Assistant

## 2017-03-02 VITALS — BP 110/88 | HR 88 | Temp 98.9°F | Resp 16 | Ht 66.0 in | Wt 168.8 lb

## 2017-03-02 DIAGNOSIS — Z1231 Encounter for screening mammogram for malignant neoplasm of breast: Secondary | ICD-10-CM | POA: Diagnosis not present

## 2017-03-02 DIAGNOSIS — Z1211 Encounter for screening for malignant neoplasm of colon: Secondary | ICD-10-CM | POA: Diagnosis not present

## 2017-03-02 DIAGNOSIS — M5431 Sciatica, right side: Secondary | ICD-10-CM | POA: Diagnosis not present

## 2017-03-02 DIAGNOSIS — Z800 Family history of malignant neoplasm of digestive organs: Secondary | ICD-10-CM | POA: Diagnosis not present

## 2017-03-02 DIAGNOSIS — Z0000 Encounter for general adult medical examination without abnormal findings: Secondary | ICD-10-CM

## 2017-03-02 DIAGNOSIS — Z1239 Encounter for other screening for malignant neoplasm of breast: Secondary | ICD-10-CM

## 2017-03-02 DIAGNOSIS — Z2821 Immunization not carried out because of patient refusal: Secondary | ICD-10-CM

## 2017-03-02 DIAGNOSIS — E663 Overweight: Secondary | ICD-10-CM | POA: Diagnosis not present

## 2017-03-02 DIAGNOSIS — Z713 Dietary counseling and surveillance: Secondary | ICD-10-CM | POA: Diagnosis not present

## 2017-03-02 DIAGNOSIS — J301 Allergic rhinitis due to pollen: Secondary | ICD-10-CM

## 2017-03-02 MED ORDER — FLUTICASONE PROPIONATE 50 MCG/ACT NA SUSP
2.0000 | Freq: Every day | NASAL | 6 refills | Status: DC
Start: 2017-03-02 — End: 2018-01-30

## 2017-03-02 MED ORDER — PHENTERMINE HCL 37.5 MG PO TABS: 37.5000 mg | ORAL_TABLET | Freq: Every day | ORAL | 2 refills | Status: DC

## 2017-03-02 MED ORDER — MONTELUKAST SODIUM 10 MG PO TABS: 10.0000 mg | ORAL_TABLET | Freq: Every day | ORAL | 3 refills | Status: DC

## 2017-03-02 NOTE — Patient Instructions (Signed)
Sciatica Sciatica is pain, numbness, weakness, or tingling along the path of the sciatic nerve. The sciatic nerve starts in the lower back and runs down the back of each leg. The nerve controls the muscles in the lower leg and in the back of the knee. It also provides feeling (sensation) to the back of the thigh, the lower leg, and the sole of the foot. Sciatica is a symptom of another medical condition that pinches or puts pressure on the sciatic nerve. Generally, sciatica only affects one side of the body. Sciatica usually goes away on its own or with treatment. In some cases, sciatica may keep coming back (recur). What are the causes? This condition is caused by pressure on the sciatic nerve, or pinching of the sciatic nerve. This may be the result of:  A disk in between the bones of the spine (vertebrae) bulging out too far (herniated disk).  Age-related changes in the spinal disks (degenerative disk disease).  A pain disorder that affects a muscle in the buttock (piriformis syndrome).  Extra bone growth (bone spur) near the sciatic nerve.  An injury or break (fracture) of the pelvis.  Pregnancy.  Tumor (rare). What increases the risk? The following factors may make you more likely to develop this condition:  Playing sports that place pressure or stress on the spine, such as football or weight lifting.  Having poor strength and flexibility.  A history of back injury.  A history of back surgery.  Sitting for long periods of time.  Doing activities that involve repetitive bending or lifting.  Obesity. What are the signs or symptoms? Symptoms can vary from mild to very severe, and they may include:  Any of these problems in the lower back, leg, hip, or buttock:  Mild tingling or dull aches.  Burning sensations.  Sharp pains.  Numbness in the back of the calf or the sole of the foot.  Leg weakness.  Severe back pain that makes movement difficult. These symptoms may  get worse when you cough, sneeze, or laugh, or when you sit or stand for long periods of time. Being overweight may also make symptoms worse. In some cases, symptoms may recur over time. How is this diagnosed? This condition may be diagnosed based on:  Your symptoms.  A physical exam. Your health care provider may ask you to do certain movements to check whether those movements trigger your symptoms.  You may have tests, including:  Blood tests.  X-rays.  MRI.  CT scan. How is this treated? In many cases, this condition improves on its own, without any treatment. However, treatment may include:  Reducing or modifying physical activity during periods of pain.  Exercising and stretching to strengthen your abdomen and improve the flexibility of your spine.  Icing and applying heat to the affected area.  Medicines that help:  To relieve pain and swelling.  To relax your muscles.  Injections of medicines that help to relieve pain, irritation, and inflammation around the sciatic nerve (steroids).  Surgery. Follow these instructions at home: Medicines   Take over-the-counter and prescription medicines only as told by your health care provider.  Do not drive or operate heavy machinery while taking prescription pain medicine. Managing pain   If directed, apply ice to the affected area.  Put ice in a plastic bag.  Place a towel between your skin and the bag.  Leave the ice on for 20 minutes, 2-3 times a day.  After icing, apply heat to the   affected area before you exercise or as often as told by your health care provider. Use the heat source that your health care provider recommends, such as a moist heat pack or a heating pad.  Place a towel between your skin and the heat source.  Leave the heat on for 20-30 minutes.  Remove the heat if your skin turns bright red. This is especially important if you are unable to feel pain, heat, or cold. You may have a greater risk of  getting burned. Activity   Return to your normal activities as told by your health care provider. Ask your health care provider what activities are safe for you.  Avoid activities that make your symptoms worse.  Take brief periods of rest throughout the day. Resting in a lying or standing position is usually better than sitting to rest.  When you rest for longer periods, mix in some mild activity or stretching between periods of rest. This will help to prevent stiffness and pain.  Avoid sitting for long periods of time without moving. Get up and move around at least one time each hour.  Exercise and stretch regularly, as told by your health care provider.  Do not lift anything that is heavier than 10 lb (4.5 kg) while you have symptoms of sciatica. When you do not have symptoms, you should still avoid heavy lifting, especially repetitive heavy lifting.  When you lift objects, always use proper lifting technique, which includes:  Bending your knees.  Keeping the load close to your body.  Avoiding twisting. General instructions   Use good posture.  Avoid leaning forward while sitting.  Avoid hunching over while standing.  Maintain a healthy weight. Excess weight puts extra stress on your back and makes it difficult to maintain good posture.  Wear supportive, comfortable shoes. Avoid wearing high heels.  Avoid sleeping on a mattress that is too soft or too hard. A mattress that is firm enough to support your back when you sleep may help to reduce your pain.  Keep all follow-up visits as told by your health care provider. This is important. Contact a health care provider if:  You have pain that wakes you up when you are sleeping.  You have pain that gets worse when you lie down.  Your pain is worse than you have experienced in the past.  Your pain lasts longer than 4 weeks.  You experience unexplained weight loss. Get help right away if:  You lose control of your bowel  or bladder (incontinence).  You have:  Weakness in your lower back, pelvis, buttocks, or legs that gets worse.  Redness or swelling of your back.  A burning sensation when you urinate. This information is not intended to replace advice given to you by your health care provider. Make sure you discuss any questions you have with your health care provider. Document Released: 04/04/2001 Document Revised: 09/14/2015 Document Reviewed: 12/18/2014 Elsevier Interactive Patient Education  2017 Elsevier Inc.  

## 2017-03-02 NOTE — Progress Notes (Signed)
Patient: Jody Nolan, Female    DOB: 11-Jan-1966, 51 y.o.   MRN: [id number] Visit Date: 03/02/2017  Today's Provider: Mar Daring, PA-C   Chief Complaint  Patient presents with  . Annual Exam   Subjective:    Annual physical exam Jody Nolan is a 51 y.o. female who presents today for health maintenance and complete physical. She feels fairly well. She reports exercising none, active with daily activites. She reports she is sleeping fairly well.  03/01/16 CPE 03/01/16 Pap-neg; HPV-neg 03/29/16 Mammogram-UL Left BI-RADS 1 -----------------------------------------------------------------   Review of Systems  Constitutional: Negative.   HENT: Positive for sinus pressure, sore throat and trouble swallowing.   Eyes: Negative.   Respiratory: Negative.   Cardiovascular: Negative.   Gastrointestinal: Negative.   Endocrine: Negative.   Genitourinary: Negative.   Musculoskeletal: Positive for arthralgias.       Right hip pain  Skin: Negative.   Allergic/Immunologic: Negative.   Neurological: Negative.   Hematological: Negative.   Psychiatric/Behavioral: Negative.     Social History      She  reports that  has never smoked. she has never used smokeless tobacco. She reports that she drinks alcohol. She reports that she does not use drugs.       Social History   Socioeconomic History  . Marital status: Married    Spouse name: None  . Number of children: 2  . Years of education: [employer]  . Highest education level: None  Social Needs  . Financial resource strain: None  . Food insecurity - worry: None  . Food insecurity - inability: None  . Transportation needs - medical: None  . Transportation needs - non-medical: None  Occupational History  . 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[employer]    Employer: [employer]  Tobacco Use  . Smoking status: Never Smoker  . Smokeless tobacco: Never Used  Substance and Sexual  Activity  . Alcohol use: Yes  . Drug use: No  . Sexual activity: Yes  Other Topics Concern  . None  Social History Narrative  . None    Past Medical History:  Diagnosis Date  . Allergic rhinitis   . Degeneration of cervical intervertebral disc   . DUB (dysfunctional uterine bleeding)   . Family history of colon cancer   . History of abnormal cervical Pap smear   . History of chicken pox   . Radiculopathy   . Reaction, stress, acute   . Shoulder pain      Patient Active Problem List   Diagnosis Date Noted  . Allergic rhinitis 01/17/2016  . Degeneration of cervical intervertebral disc 01/17/2016  . Dysfunctional uterine bleeding 01/17/2016  . Family history of GI tract cancer 01/17/2016  . Vaginal Pap smear, abnormal 01/17/2016  . Radiculopathy 01/17/2016  . Family history of colon cancer 07/19/2009  . Uncomplicated herpes simplex 07/19/2009    Past Surgical History:  Procedure Laterality Date  . CESAREAN SECTION  2000  . LEEP  73's    Family History        Family Status  Relation Name Status  . Mother  Alive  . Father  Deceased at age 93       colon cancer  . Brother  Alive  . Daughter  Alive  . Son  Alive  . MGM  Alive, age 9y  . PGM  Alive, age 16y        Her family history includes  Cancer in her father; Hypertension in her brother and paternal grandmother; Stroke in her paternal grandmother.     No Known Allergies   Current Outpatient Medications:  .  albuterol (PROVENTIL HFA;VENTOLIN HFA) 108 (90 Base) MCG/ACT inhaler, Inhale 2 puffs into the lungs every 6 (six) hours as needed for wheezing or shortness of breath., Disp: 1 Inhaler, Rfl: 2 .  Cetirizine HCl (ZYRTEC ALLERGY) 10 MG CAPS, Take 1 tablet by mouth daily., Disp: , Rfl:  .  phentermine (ADIPEX-P) 37.5 MG tablet, Take 1 tablet (37.5 mg total) by mouth daily before breakfast., Disp: 30 tablet, Rfl: 2 .  valACYclovir (VALTREX) 1000 MG tablet, Take two tablets every 12 h for one day at onset  of cold core, Disp: 12 tablet, Rfl: 1   Patient Care Team: Carmon Ginsberg, PA as PCP - General (Family Medicine)      Objective:   Vitals: BP 110/88 (BP Location: Left Arm, Patient Position: Sitting, Cuff Size: Large)   Pulse 88   Temp 98.9 F (37.2 C) (Oral)   Resp 16   Ht 5\' 6"  (1.676 m)   Wt 168 lb 12.8 oz (76.6 kg)   SpO2 98%   BMI 27.25 kg/m    Vitals:   03/02/17 1516  BP: 110/88  Pulse: 88  Resp: 16  Temp: 98.9 F (37.2 C)  TempSrc: Oral  SpO2: 98%  Weight: 168 lb 12.8 oz (76.6 kg)  Height: 5\' 6"  (1.676 m)     Physical Exam  Constitutional: She is oriented to person, place, and time. She appears well-developed and well-nourished. No distress.  HENT:  Head: Normocephalic and atraumatic.  Right Ear: Hearing, external ear and ear canal normal. A middle ear effusion (slight opaque discoloration at the bottom corner (7 o'clock)) is present.  Left Ear: Hearing, tympanic membrane, external ear and ear canal normal.  Nose: Nose normal. Right sinus exhibits no maxillary sinus tenderness and no frontal sinus tenderness. Left sinus exhibits no maxillary sinus tenderness and no frontal sinus tenderness.  Mouth/Throat: Uvula is midline, oropharynx is clear and moist and mucous membranes are normal. No oropharyngeal exudate.  Eyes: Conjunctivae and EOM are normal. Pupils are equal, round, and reactive to light. Right eye exhibits no discharge. Left eye exhibits no discharge. No scleral icterus.  Neck: Normal range of motion. Neck supple. No JVD present. Carotid bruit is not present. No tracheal deviation present. No thyromegaly present.  Cardiovascular: Normal rate, regular rhythm, normal heart sounds and intact distal pulses. Exam reveals no gallop and no friction rub.  No murmur heard. Pulmonary/Chest: Effort normal and breath sounds normal. No respiratory distress. She has no wheezes. She has no rales. She exhibits no tenderness. Right breast exhibits no inverted nipple, no  mass, no nipple discharge, no skin change and no tenderness. Left breast exhibits no inverted nipple, no mass, no nipple discharge, no skin change and no tenderness. Breasts are symmetrical.  Abdominal: Soft. Bowel sounds are normal. She exhibits no distension and no mass. There is no tenderness. There is no rebound and no guarding.  Musculoskeletal: Normal range of motion. She exhibits no edema or tenderness.  Lymphadenopathy:    She has no cervical adenopathy.  Neurological: She is alert and oriented to person, place, and time.  Skin: Skin is warm and dry. No rash noted. She is not diaphoretic.  Psychiatric: She has a normal mood and affect. Her behavior is normal. Judgment and thought content normal.  Vitals reviewed.    Depression Screen PHQ  2/9 Scores 03/02/2017 06/26/2016  PHQ - 2 Score 0 0  PHQ- 9 Score 2 0      Assessment & Plan:     Routine Health Maintenance and Physical Exam  Exercise Activities and Dietary recommendations Goals    None      Immunization History  Administered Date(s) Administered  . Td 06/28/2010  . Tdap 06/28/2010    Health Maintenance  Topic Date Due  . HIV Screening  10/21/1982  . INFLUENZA VACCINE  11/22/2016  . PAP SMEAR  03/02/2019  . TETANUS/TDAP  06/27/2020     Discussed health benefits of physical activity, and encouraged her to engage in regular exercise appropriate for her age and condition.    1. Annual physical exam Normal physical exam today. Will check labs as below and f/u pending lab results. If labs are stable and WNL she will not need to have these rechecked for one year at her next annual physical exam. She is to call the office in the meantime if she has any acute issue, questions or concerns. - CBC w/Diff/Platelet - COMPLETE METABOLIC PANEL WITH GFR - TSH - Lipid Profile - HgB A1c  2. Breast cancer screening Breast exam today was normal. There is no family history of breast cancer. She does perform regular self  breast exams. Mammogram was ordered as below. Information for [facility] was given to patient so she may schedule her mammogram at her convenience.  3. Colon cancer screening Patient does have family history of colon cancer in her father (cause of death at 14). She is interested in trying to schedule her colonoscopy next summer during the summer break from school. She is to call back in March or April for the referral to GI.   4. Encounter for weight loss counseling Still continues to do well. Will continue phentermine as below for 3 more months then will discontinue.  - phentermine (ADIPEX-P) 37.5 MG tablet; Take 1 tablet (37.5 mg total) daily before breakfast by mouth.  Dispense: 30 tablet; Refill: 2  5. Overweight (BMI 25.0-29.9) See above medical treatment plan. - Lipid Profile - HgB A1c - phentermine (ADIPEX-P) 37.5 MG tablet; Take 1 tablet (37.5 mg total) daily before breakfast by mouth.  Dispense: 30 tablet; Refill: 2  6. Seasonal allergic rhinitis due to pollen Worsening. Recently treated for sinusitis on 01/23/17. Will add flonase and singulair as below for better symptom control during her peak months.  - montelukast (SINGULAIR) 10 MG tablet; Take 1 tablet (10 mg total) at bedtime by mouth.  Dispense: 30 tablet; Refill: 3 - fluticasone (FLONASE) 50 MCG/ACT nasal spray; Place 2 sprays daily into both nostrils.  Dispense: 16 g; Refill: 6  7. Family history of colon cancer In father, cause of death at 49.   15. Sciatica of right side New onset. Discussed conservative management with IBU or aleve. Epsom salt soaks, massages, stretching. She is to call if symptoms worsen.   9. Influenza vaccination declined  --------------------------------------------------------------------    Mar Daring, PA-C  [facility]

## 2017-03-03 LAB — LIPID PANEL
CHOL/HDL RATIO: 2.4 (calc) (ref <5.0)
CHOLESTEROL: 190 mg/dL (ref <200)
HDL: 79 mg/dL (ref >50)
LDL Cholesterol (Calc): 92 mg/dL (calc)
Non-HDL Cholesterol (Calc): 111 mg/dL (calc) (ref <130)
Triglycerides: 95 mg/dL (ref <150)

## 2017-03-03 LAB — HEMOGLOBIN A1C
EAG (MMOL/L): 5.4 (calc)
Hgb A1c MFr Bld: 5 % of total Hgb (ref <5.7)
Mean Plasma Glucose: 97 (calc)

## 2017-03-03 LAB — CBC WITH DIFFERENTIAL/PLATELET
BASOS PCT: 0.8 %
Basophils Absolute: 95 cells/uL (ref 0–200)
EOS ABS: 250 {cells}/uL (ref 15–500)
EOS PCT: 2.1 %
HCT: 41.8 % (ref 35.0–45.0)
HEMOGLOBIN: 14.2 g/dL (ref 11.7–15.5)
Lymphs Abs: 2047 cells/uL (ref 850–3900)
MCH: 30 pg (ref 27.0–33.0)
MCHC: 34 g/dL (ref 32.0–36.0)
MCV: 88.4 fL (ref 80.0–100.0)
MONOS PCT: 7.6 %
MPV: 10.8 fL (ref 7.5–12.5)
NEUTROS ABS: 8604 {cells}/uL — AB (ref 1500–7800)
Neutrophils Relative %: 72.3 %
PLATELETS: 318 10*3/uL (ref 140–400)
RBC: 4.73 10*6/uL (ref 3.80–5.10)
RDW: 11.9 % (ref 11.0–15.0)
TOTAL LYMPHOCYTE: 17.2 %
WBC mixed population: 904 cells/uL (ref 200–950)
WBC: 11.9 10*3/uL — ABNORMAL HIGH (ref 3.8–10.8)

## 2017-03-03 LAB — COMPLETE METABOLIC PANEL WITH GFR
AG RATIO: 2 (calc) (ref 1.0–2.5)
ALT: 8 U/L (ref 6–29)
AST: 12 U/L (ref 10–35)
Albumin: 4.5 g/dL (ref 3.6–5.1)
Alkaline phosphatase (APISO): 46 U/L (ref 33–115)
BUN: 17 mg/dL (ref 7–25)
CO2: 27 mmol/L (ref 20–32)
CREATININE: 0.93 mg/dL (ref 0.50–1.10)
Calcium: 9.7 mg/dL (ref 8.6–10.2)
Chloride: 104 mmol/L (ref 98–110)
GFR, EST NON AFRICAN AMERICAN: 72 mL/min/{1.73_m2} (ref >=60)
GFR, Est African American: 84 mL/min/{1.73_m2} (ref >=60)
Globulin: 2.3 g/dL (calc) (ref 1.9–3.7)
Glucose, Bld: 80 mg/dL (ref 65–99)
POTASSIUM: 4.4 mmol/L (ref 3.5–5.3)
Sodium: 142 mmol/L (ref 135–146)
Total Bilirubin: 0.3 mg/dL (ref 0.2–1.2)
Total Protein: 6.8 g/dL (ref 6.1–8.1)

## 2017-03-03 LAB — TSH: TSH: 2.68 m[IU]/L

## 2017-03-05 ENCOUNTER — Telehealth: Payer: Self-pay

## 2017-03-05 NOTE — Telephone Encounter (Signed)
-----   Message from Mar Daring, [place] sent at 03/05/2017 11:02 AM EST ----- WBC count slightly elevated consistent with viral infection (the URI/sinus symptoms you were having). All other labs are WNL and stable.

## 2017-03-05 NOTE — Telephone Encounter (Signed)
LMTCB  Thanks,  -Kendall Justo 

## 2017-03-05 NOTE — Telephone Encounter (Signed)
Advised patient of results.  

## 2017-04-23 ENCOUNTER — Other Ambulatory Visit: Payer: Self-pay | Admitting: Family Medicine

## 2017-04-23 DIAGNOSIS — Z8619 Personal history of other infectious and parasitic diseases: Secondary | ICD-10-CM

## 2017-04-25 NOTE — Telephone Encounter (Signed)
See refill request.

## 2017-04-26 ENCOUNTER — Other Ambulatory Visit: Payer: Self-pay | Admitting: Physician Assistant

## 2017-04-26 ENCOUNTER — Telehealth: Payer: Self-pay | Admitting: Family Medicine

## 2017-04-26 DIAGNOSIS — J301 Allergic rhinitis due to pollen: Secondary | ICD-10-CM

## 2017-04-26 MED ORDER — MONTELUKAST SODIUM 10 MG PO TABS: 10.0000 mg | ORAL_TABLET | Freq: Every day | ORAL | 1 refills | Status: DC

## 2017-04-26 NOTE — Telephone Encounter (Signed)
Open in error

## 2017-04-26 NOTE — Telephone Encounter (Signed)
[facility] faxed a refill request for a 90-days supply for the following medication. Thanks CC  montelukast (SINGULAIR) 10 MG tablet

## 2017-05-11 ENCOUNTER — Other Ambulatory Visit: Payer: Self-pay | Admitting: Physician Assistant

## 2017-05-11 ENCOUNTER — Ambulatory Visit
Admission: RE | Admit: 2017-05-11 | Discharge: 2017-05-11 | Disposition: A | Discharge status: Home / Self Care | Payer: BC Managed Care – PPO | Source: Ambulatory Visit | Attending: Physician Assistant | Admitting: Physician Assistant

## 2017-05-11 ENCOUNTER — Telehealth: Payer: Self-pay

## 2017-05-11 DIAGNOSIS — N6310 Unspecified lump in the right breast, unspecified quadrant: Secondary | ICD-10-CM

## 2017-05-11 DIAGNOSIS — R928 Other abnormal and inconclusive findings on diagnostic imaging of breast: Secondary | ICD-10-CM

## 2017-05-11 DIAGNOSIS — Z1231 Encounter for screening mammogram for malignant neoplasm of breast: Secondary | ICD-10-CM | POA: Diagnosis present

## 2017-05-11 DIAGNOSIS — Z1239 Encounter for other screening for malignant neoplasm of breast: Secondary | ICD-10-CM

## 2017-05-11 NOTE — Telephone Encounter (Signed)
-----   Message from Mar Daring, [place] sent at 05/11/2017  1:38 PM EST ----- Abnormal mammogram in right breast. Diagnostic and [place] of right breast recommended. [place] [facility] will be contacting her to schedule these.

## 2017-05-11 NOTE — Telephone Encounter (Signed)
Patient advised as directed below.  Thanks,  -Aneri Slagel 

## 2017-05-16 ENCOUNTER — Telehealth: Payer: Self-pay

## 2017-05-16 ENCOUNTER — Ambulatory Visit
Admission: RE | Admit: 2017-05-16 | Discharge: 2017-05-16 | Disposition: A | Discharge status: Home / Self Care | Payer: BC Managed Care – PPO | Source: Ambulatory Visit | Attending: Physician Assistant | Admitting: Physician Assistant

## 2017-05-16 DIAGNOSIS — N6310 Unspecified lump in the right breast, unspecified quadrant: Secondary | ICD-10-CM

## 2017-05-16 DIAGNOSIS — R928 Other abnormal and inconclusive findings on diagnostic imaging of breast: Secondary | ICD-10-CM | POA: Insufficient documentation

## 2017-05-16 DIAGNOSIS — N6312 Unspecified lump in the right breast, upper inner quadrant: Secondary | ICD-10-CM | POA: Diagnosis not present

## 2017-05-16 NOTE — Telephone Encounter (Signed)
Patient returned Elena's call.  Please call her on her cell phone [phone]

## 2017-05-16 NOTE — Telephone Encounter (Signed)
[facility]  ED   ----- Message from Mar Daring, PA-C sent at 05/16/2017 10:55 AM EST ----- Diagnostic and [place] of right breast reveal possible cluster of cysts. It is recommended to have a repeat diagnostic mammogram and US of the right breast in 6 months to check for stability. [place] should contact you for this.

## 2017-05-16 NOTE — Telephone Encounter (Signed)
-----   Message from Mar Daring, [place] sent at 05/16/2017 10:55 AM EST ----- Diagnostic and [place] of right breast reveal possible cluster of cysts. It is recommended to have a repeat diagnostic mammogram and US of the right breast in 6 months to check for stability. [place] should contact you for this.

## 2017-05-16 NOTE — Telephone Encounter (Signed)
Advised  ED 

## 2017-06-18 ENCOUNTER — Ambulatory Visit: Payer: BC Managed Care – PPO | Admitting: Family Medicine

## 2017-06-18 ENCOUNTER — Encounter: Payer: Self-pay | Admitting: Family Medicine

## 2017-06-18 VITALS — BP 114/80 | HR 85 | Temp 97.8°F | Resp 16 | Wt 177.8 lb

## 2017-06-18 DIAGNOSIS — N3090 Cystitis, unspecified without hematuria: Secondary | ICD-10-CM

## 2017-06-18 LAB — POCT URINALYSIS DIPSTICK
GLUCOSE UA: NEGATIVE
Nitrite, UA: NEGATIVE
Spec Grav, UA: 1.025 (ref 1.010–1.025)
Urobilinogen, UA: 0.2 E.U./dL
pH, UA: 5 (ref 5.0–8.0)

## 2017-06-18 MED ORDER — CEPHALEXIN 500 MG PO CAPS: 500.0000 mg | ORAL_CAPSULE | Freq: Two times a day (BID) | ORAL | 0 refills | Status: DC

## 2017-06-18 NOTE — Progress Notes (Signed)
Subjective:     Patient ID: Jody Nolan, female   DOB: 10-05-1965, 52 y.o.   MRN: [id number] Chief Complaint  Patient presents with  . Urinary Tract Infection    Patient comes in office today with concerns of a urinary infection, for the past 3 days. Patient reports urgency, frequency and dysuria.    HPI Reports no fever or chills or hx of UTI.  Review of Systems     Objective:   Physical Exam  Constitutional: She appears well-developed and well-nourished. No distress.  Genitourinary:  Genitourinary Comments: No cva tenderness       Assessment:    1. Cystitis: start cephalexin 500 mg. Bid #14 - Urine Culture - POCT urinalysis dipstick    Plan:    Further f/u pending culture result.

## 2017-06-18 NOTE — Patient Instructions (Signed)
We will cal you with the urine culture report later this week.

## 2017-06-22 ENCOUNTER — Telehealth: Payer: Self-pay

## 2017-06-22 LAB — URINE CULTURE

## 2017-06-22 NOTE — Telephone Encounter (Signed)
[id number]-KW 

## 2017-06-22 NOTE — Telephone Encounter (Signed)
-----   Message from Carmon Ginsberg, [place] sent at 06/22/2017  7:27 AM EST ----- Routine E. Coli infection. Are you better on the antibiotic?

## 2017-06-25 NOTE — Telephone Encounter (Signed)
If still having symptoms after completing the antibiotic-come for repeat urine and culture.

## 2017-06-25 NOTE — Telephone Encounter (Signed)
LMTCB 06/25/2017  Thanks,   -Mickel Baas

## 2017-06-25 NOTE — Telephone Encounter (Signed)
See message.

## 2017-06-25 NOTE — Telephone Encounter (Signed)
Pt advised. States she is still experiencing left flank pain, and is on the last day of Keflex. Other sx improved. [facility]. Please advise.

## 2017-06-25 NOTE — Telephone Encounter (Signed)
Patient is returning a call to Mickel Baas. Please call her CELL PHONE only  [phone]

## 2017-06-26 NOTE — Telephone Encounter (Signed)
Patient advised.KW 

## 2017-10-10 ENCOUNTER — Other Ambulatory Visit: Payer: Self-pay | Admitting: Physician Assistant

## 2017-10-10 DIAGNOSIS — N6310 Unspecified lump in the right breast, unspecified quadrant: Secondary | ICD-10-CM

## 2017-10-29 ENCOUNTER — Other Ambulatory Visit: Payer: Self-pay | Admitting: Physician Assistant

## 2017-10-29 DIAGNOSIS — J301 Allergic rhinitis due to pollen: Secondary | ICD-10-CM

## 2017-10-29 NOTE — Telephone Encounter (Signed)
Chart has you listed as PCP but patient has been seen mainly by [facility], please review chart and advise. KW

## 2017-10-29 NOTE — Telephone Encounter (Signed)
See refill request.

## 2017-11-14 ENCOUNTER — Other Ambulatory Visit: Payer: Self-pay

## 2017-11-15 ENCOUNTER — Ambulatory Visit
Admission: RE | Admit: 2017-11-15 | Discharge: 2017-11-15 | Disposition: A | Discharge status: Home / Self Care | Payer: BC Managed Care – PPO | Source: Ambulatory Visit | Attending: Physician Assistant | Admitting: Physician Assistant

## 2017-11-15 DIAGNOSIS — N6310 Unspecified lump in the right breast, unspecified quadrant: Secondary | ICD-10-CM | POA: Insufficient documentation

## 2018-01-21 ENCOUNTER — Encounter: Payer: Self-pay | Admitting: Physician Assistant

## 2018-01-21 ENCOUNTER — Ambulatory Visit: Payer: BC Managed Care – PPO | Admitting: Physician Assistant

## 2018-01-21 VITALS — BP 140/99 | HR 88 | Temp 97.9°F | Resp 20 | Ht 65.5 in | Wt 175.0 lb

## 2018-01-21 DIAGNOSIS — F419 Anxiety disorder, unspecified: Secondary | ICD-10-CM

## 2018-01-21 DIAGNOSIS — F329 Major depressive disorder, single episode, unspecified: Secondary | ICD-10-CM

## 2018-01-21 DIAGNOSIS — Z2821 Immunization not carried out because of patient refusal: Secondary | ICD-10-CM | POA: Diagnosis not present

## 2018-01-21 MED ORDER — ESCITALOPRAM OXALATE 10 MG PO TABS: ORAL_TABLET | ORAL | 1 refills | Status: DC

## 2018-01-21 MED ORDER — ALPRAZOLAM 0.25 MG PO TABS: 0.2500 mg | ORAL_TABLET | Freq: Two times a day (BID) | ORAL | 0 refills | Status: DC | PRN

## 2018-01-21 NOTE — Patient Instructions (Signed)
Escitalopram tablets What is this medicine? ESCITALOPRAM (es sye TAL oh pram) is used to treat depression and certain types of anxiety. This medicine may be used for other purposes; ask your health care provider or pharmacist if you have questions. COMMON BRAND NAME(S): Lexapro What should I tell my health care provider before I take this medicine? They need to know if you have any of these conditions: -bipolar disorder or a family history of bipolar disorder -diabetes -glaucoma -heart disease -kidney or liver disease -receiving electroconvulsive therapy -seizures (convulsions) -suicidal thoughts, plans, or attempt by you or a family member -an unusual or allergic reaction to escitalopram, the related drug citalopram, other medicines, foods, dyes, or preservatives -pregnant or trying to become pregnant -breast-feeding How should I use this medicine? Take this medicine by mouth with a glass of water. Follow the directions on the prescription label. You can take it with or without food. If it upsets your stomach, take it with food. Take your medicine at regular intervals. Do not take it more often than directed. Do not stop taking this medicine suddenly except upon the advice of your doctor. Stopping this medicine too quickly may cause serious side effects or your condition may worsen. A special MedGuide will be given to you by the pharmacist with each prescription and refill. Be sure to read this information carefully each time. Talk to your pediatrician regarding the use of this medicine in children. Special care may be needed. Overdosage: If you think you have taken too much of this medicine contact a poison control center or emergency room at once. NOTE: This medicine is only for you. Do not share this medicine with others. What if I miss a dose? If you miss a dose, take it as soon as you can. If it is almost time for your next dose, take only that dose. Do not take double or extra  doses. What may interact with this medicine? Do not take this medicine with any of the following medications: -certain medicines for fungal infections like fluconazole, itraconazole, ketoconazole, posaconazole, voriconazole -cisapride -citalopram -dofetilide -dronedarone -linezolid -MAOIs like Carbex, Eldepryl, Marplan, Nardil, and Parnate -methylene blue (injected into a vein) -pimozide -thioridazine -ziprasidone This medicine may also interact with the following medications: -alcohol -amphetamines -aspirin and aspirin-like medicines -carbamazepine -certain medicines for depression, anxiety, or psychotic disturbances -certain medicines for migraine headache like almotriptan, eletriptan, frovatriptan, naratriptan, rizatriptan, sumatriptan, zolmitriptan -certain medicines for sleep -certain medicines that treat or prevent blood clots like warfarin, enoxaparin, dalteparin -cimetidine -diuretics -fentanyl -furazolidone -isoniazid -lithium -metoprolol -NSAIDs, medicines for pain and inflammation, like ibuprofen or naproxen -other medicines that prolong the QT interval (cause an abnormal heart rhythm) -procarbazine -rasagiline -supplements like St. John's wort, kava kava, valerian -tramadol -tryptophan This list may not describe all possible interactions. Give your health care provider a list of all the medicines, herbs, non-prescription drugs, or dietary supplements you use. Also tell them if you smoke, drink alcohol, or use illegal drugs. Some items may interact with your medicine. What should I watch for while using this medicine? Tell your doctor if your symptoms do not get better or if they get worse. Visit your doctor or health care professional for regular checks on your progress. Because it may take several weeks to see the full effects of this medicine, it is important to continue your treatment as prescribed by your doctor. Patients and their families should watch out for  new or worsening thoughts of suicide or depression. Also watch out for   sudden changes in feelings such as feeling anxious, agitated, panicky, irritable, hostile, aggressive, impulsive, severely restless, overly excited and hyperactive, or not being able to sleep. If this happens, especially at the beginning of treatment or after a change in dose, call your health care professional. Dennis Bast may get drowsy or dizzy. Do not drive, use machinery, or do anything that needs mental alertness until you know how this medicine affects you. Do not stand or sit up quickly, especially if you are an older patient. This reduces the risk of dizzy or fainting spells. Alcohol may interfere with the effect of this medicine. Avoid alcoholic drinks. Your mouth may get dry. Chewing sugarless gum or sucking hard candy, and drinking plenty of water may help. Contact your doctor if the problem does not go away or is severe. What side effects may I notice from receiving this medicine? Side effects that you should report to your doctor or health care professional as soon as possible: -allergic reactions like skin rash, itching or hives, swelling of the face, lips, or tongue -anxious -black, tarry stools -changes in vision -confusion -elevated mood, decreased need for sleep, racing thoughts, impulsive behavior -eye pain -fast, irregular heartbeat -feeling faint or lightheaded, falls -feeling agitated, angry, or irritable -hallucination, loss of contact with reality -loss of balance or coordination -loss of memory -painful or prolonged erections -restlessness, pacing, inability to keep still -seizures -stiff muscles -suicidal thoughts or other mood changes -trouble sleeping -unusual bleeding or bruising -unusually weak or tired -vomiting Side effects that usually do not require medical attention (report to your doctor or health care professional if they continue or are bothersome): -changes in appetite -change in sex  drive or performance -headache -increased sweating -indigestion, nausea -tremors This list may not describe all possible side effects. Call your doctor for medical advice about side effects. You may report side effects to FDA at 1-800-FDA-1088. Where should I keep my medicine? Keep out of reach of children. Store at room temperature between 15 and 30 degrees C (59 and 86 degrees F). Throw away any unused medicine after the expiration date. NOTE: This sheet is a summary. It may not cover all possible information. If you have questions about this medicine, talk to your doctor, pharmacist, or health care provider.  2018 Elsevier/Gold Standard (2015-09-13 13:20:23)  10 Relaxation Techniques That Zap Stress Fast By Tomasa Hosteller Moninger   Listen  Relax. You deserve it, it's good for you, and it takes less time than you think. You don't need a spa weekend or a retreat. Each of these stress-relieving tips can get you from OMG to om in less than 15 minutes. 1. Meditate  A few minutes of practice per day can help ease anxiety. "Research suggests that daily meditation may alter the brain's neural pathways, making you more resilient to stress," says psychologist Vinson Moselle, PhD, a [place] and wellness coach. It's simple. Sit up straight with both feet on the floor. Close your eyes. Focus your attention on reciting -- out loud or silently -- a positive mantra such as "I feel at peace" or "I love myself." Place one hand on your belly to sync the mantra with your breaths. Let any distracting thoughts float by like clouds. 2. Breathe Deeply  Take a 5-minute break and focus on your breathing. Sit up straight, eyes closed, with a hand on your belly. Slowly inhale through your nose, feeling the breath start in your abdomen and work its way to the top of your head.  Reverse the process as you exhale through your mouth.  "Deep breathing counters the effects of stress by slowing the heart rate and  lowering blood pressure," psychologist Verdene Rio, PhD, says. She's a certified life coach in [place], [place] 3. Be Present  Slow down.  "Take 5 minutes and focus on only one behavior with awareness," Serena Colonel says. Notice how the air feels on your face when you're walking and how your feet feel hitting the ground. Enjoy the texture and taste of each bite of food. When you spend time in the moment and focus on your senses, you should feel less tense. 4. Reach Out  Your social network is one of your best tools for handling stress. Talk to others -- preferably face to face, or at least on the phone. Share what's going on. You can get a fresh perspective while keeping your connection strong. 5. Tune In to Your Body  Mentally scan your body to get a sense of how stress affects it each day. Lie on your back, or sit with your feet on the floor. Start at your toes and work your way up to your scalp, noticing how your body feels.  "Simply be aware of places you feel tight or loose without trying to change anything," Serena Colonel says. For 1 to 2 minutes, imagine each deep breath flowing to that body part. Repeat this process as you move your focus up your body, paying close attention to sensations you feel in each body part. 6. Decompress  Place a warm heat wrap around your neck and shoulders for 10 minutes. Close your eyes and relax your face, neck, upper chest, and back muscles. Remove the wrap, and use a tennis ball or foam roller to massage away tension.  "Place the ball between your back and the wall. Lean into the ball, and hold gentle pressure for up to 15 seconds. Then move the ball to another spot, and apply pressure," says Derenda Mis, a nurse practitioner and assistant professor at [employer] in [place]. 7. Laugh Out Loud  A good belly laugh doesn't just lighten the load mentally. It lowers cortisol, your body's stress hormone, and boosts brain chemicals called endorphins,  which help your mood. Lighten up by tuning in to your favorite sitcom or video, reading the comics, or chatting with someone who makes you smile. 8. Crank Up the [place] shows that listening to soothing music can lower blood pressure, heart rate, and anxiety. "Create a playlist of songs or nature sounds (the ocean, a bubbling brook, birds chirping), and allow your mind to focus on the different melodies, instruments, or singers in the piece," Benninger says. You also can blow off steam by rocking out to more upbeat tunes -- or singing at the top of your lungs! 9. Get Moving  You don't have to run in order to get a runner's high. All forms of exercise, including yoga and walking, can ease depression and anxiety by helping the brain release feel-good chemicals and by giving your body a chance to practice dealing with stress. You can go for a quick walk around the block, take the stairs up and down a few flights, or do some stretching exercises like head rolls and shoulder shrugs. 10. Be Grateful  Keep a gratitude journal or several (one by your bed, one in your purse, and one at work) to help you remember all the things that are good in your life.  "Being grateful for  your blessings cancels out negative thoughts and worries," says [employer], a wellness coach in [place], [place].  Use these journals to savor good experiences like a child's smile, a sunshine-filled day, and good health. Don't forget to celebrate accomplishments like mastering a new task at work or a new hobby. When you start feeling stressed, spend a few minutes looking through your notes to remind yourself what really matters.

## 2018-01-21 NOTE — Progress Notes (Signed)
Patient: Jody Nolan Female    DOB: 1965-08-16   52 y.o.   MRN: [id number] Visit Date: 01/21/2018  Today's Provider: Mar Daring, PA-C   Chief Complaint  Patient presents with  . Anxiety   Subjective:    HPI Patient here today C/O anxiety on and off x's 1 1/2 year and worsening since June. Patient reports that there is a lot going at school. Patient reports symptoms as weight gain, elevated blood pressure and being very nervous. Patient reports that she took 4 doses of Xanax from a friend last week. Patient reports symptoms did improve on medications.   GAD 7 : Generalized Anxiety Score 01/21/2018  Nervous, Anxious, on Edge 3  Control/stop worrying 2  Worry too much - different things 2  Trouble relaxing 3  Restless 2  Easily annoyed or irritable 0  Afraid - awful might happen 2  Total GAD 7 Score 14  Anxiety Difficulty Somewhat difficult   Depression screen [facility] 2/9 01/21/2018 03/02/2017 06/26/2016  Decreased Interest 1 0 0  Down, Depressed, Hopeless 0 0 0  PHQ - 2 Score 1 0 0  Altered sleeping 3 2 0  Tired, decreased energy 2 0 0  Change in appetite 3 0 0  Feeling bad or failure about yourself  0 0 0  Trouble concentrating 1 0 0  Moving slowly or fidgety/restless 2 0 0  Suicidal thoughts 0 0 0  PHQ-9 Score 12 2 0  Difficult doing work/chores Not difficult at all - Not difficult at all       No Known Allergies   Current Outpatient Medications:  .  Cetirizine HCl (ZYRTEC ALLERGY) 10 MG CAPS, Take 1 tablet by mouth daily., Disp: , Rfl:  .  albuterol (PROVENTIL HFA;VENTOLIN HFA) 108 (90 Base) MCG/ACT inhaler, Inhale 2 puffs into the lungs every 6 (six) hours as needed for wheezing or shortness of breath. (Patient not taking: Reported on 06/18/2017), Disp: 1 Inhaler, Rfl: 2 .  fluticasone (FLONASE) 50 MCG/ACT nasal spray, Place 2 sprays daily into both nostrils. (Patient not taking: Reported on 06/18/2017), Disp: 16 g, Rfl: 6 .  valACYclovir (VALTREX) 1000 MG  tablet, TAKE TWO TABLETS EVERY 12 H FOR ONE DAY AT ONSET OF COLD CORE (Patient not taking: Reported on 06/18/2017), Disp: 12 tablet, Rfl: 1  Review of Systems  Constitutional: Positive for activity change and appetite change.  Respiratory: Negative.   Cardiovascular: Positive for palpitations.  Gastrointestinal: Negative.   Neurological: Positive for headaches.  Psychiatric/Behavioral: Positive for agitation, decreased concentration and sleep disturbance. The patient is nervous/anxious.     Social History   Tobacco Use  . Smoking status: Never Smoker  . Smokeless tobacco: Never Used  Substance Use Topics  . Alcohol use: Yes   Objective:   BP (!) 140/99 (BP Location: Left Arm, Patient Position: Sitting, Cuff Size: Large)   Pulse 88   Temp 97.9 F (36.6 C) (Oral)   Resp 20   Ht 5' 5.5" (1.664 m)   Wt 175 lb (79.4 kg)   BMI 28.68 kg/m  Vitals:   01/21/18 0824  BP: (!) 140/99  Pulse: 88  Resp: 20  Temp: 97.9 F (36.6 C)  TempSrc: Oral  Weight: 175 lb (79.4 kg)  Height: 5' 5.5" (1.664 m)    Physical Exam  Constitutional: She is oriented to person, place, and time. She appears well-developed and well-nourished. No distress.  HENT:  Head: Normocephalic and atraumatic.  Neck: Normal range  of motion. Neck supple.  Cardiovascular: Normal rate, regular rhythm, normal heart sounds and intact distal pulses. Exam reveals no gallop and no friction rub.  No murmur heard. Pulmonary/Chest: Effort normal. She has no wheezes. She has no rales. She exhibits no tenderness.  Neurological: She is alert and oriented to person, place, and time.  Skin: She is not diaphoretic.  Psychiatric: Her speech is normal and behavior is normal. Judgment and thought content normal. Her mood appears anxious. Cognition and memory are normal. She exhibits a depressed mood.       Assessment & Plan:     1. Anxiety and depression Worsening over the last year. Will start lexapro as below and xanax for prn  use. She is to call if she has adverse reactions to the medication. She is also to monitor her BP once weekly. If consistently elevated she is to send a mychart message to start a BP medication. If no issues I will see her back on 03/06/18 for her CPE.  - escitalopram (LEXAPRO) 10 MG tablet; Start with 5mg  (0.5 tab) PO q hs x 1 week then increase to 10 mg (1 tab) PO q hs  Dispense: 30 tablet; Refill: 1 - ALPRAZolam (XANAX) 0.25 MG tablet; Take 1 tablet (0.25 mg total) by mouth 2 (two) times daily as needed for anxiety.  Dispense: 60 tablet; Refill: 0  2. Influenza vaccination declined       Mar Daring, PA-C  [facility]

## 2018-01-30 ENCOUNTER — Encounter: Payer: Self-pay | Admitting: Physician Assistant

## 2018-01-30 ENCOUNTER — Ambulatory Visit: Payer: BC Managed Care – PPO | Admitting: Physician Assistant

## 2018-01-30 VITALS — BP 120/80 | HR 95 | Temp 98.2°F | Resp 16 | Wt 176.5 lb

## 2018-01-30 DIAGNOSIS — R300 Dysuria: Secondary | ICD-10-CM

## 2018-01-30 LAB — POCT URINALYSIS DIPSTICK
Bilirubin, UA: NEGATIVE
Blood, UA: NEGATIVE
Glucose, UA: NEGATIVE
Ketones, UA: NEGATIVE
Nitrite, UA: NEGATIVE
Protein, UA: NEGATIVE
Spec Grav, UA: 1.015 (ref 1.010–1.025)
Urobilinogen, UA: 0.2 E.U./dL
pH, UA: 6.5 (ref 5.0–8.0)

## 2018-01-30 MED ORDER — SULFAMETHOXAZOLE-TRIMETHOPRIM 800-160 MG PO TABS: 1.0000 | ORAL_TABLET | Freq: Two times a day (BID) | ORAL | 0 refills | Status: AC

## 2018-01-30 MED ORDER — SULFAMETHOXAZOLE-TRIMETHOPRIM 800-160 MG PO TABS: 1.0000 | ORAL_TABLET | Freq: Two times a day (BID) | ORAL | 0 refills | Status: DC

## 2018-01-30 NOTE — Progress Notes (Signed)
       Patient: Jody Nolan Female    DOB: 28-Apr-1965   52 y.o.   MRN: [id number] Visit Date: 01/30/2018  Today's Provider: Trinna Post, PA-C   Chief Complaint  Patient presents with  . Urinary Tract Infection   Subjective:    HPI  Urinary Tract Infection: Patient complains of burning with urination She has had symptoms for 2 days. Patient also complains of no other symptoms. Patient denies back pain and fever. Patient does not have a history of recurrent UTI.  Patient does not have a history of pyelonephritis.      No Known Allergies   Current Outpatient Medications:  .  ALPRAZolam (XANAX) 0.25 MG tablet, Take 1 tablet (0.25 mg total) by mouth 2 (two) times daily as needed for anxiety., Disp: 60 tablet, Rfl: 0 .  Cetirizine HCl (ZYRTEC ALLERGY) 10 MG CAPS, Take 1 tablet by mouth daily., Disp: , Rfl:  .  escitalopram (LEXAPRO) 10 MG tablet, Start with 5mg  (0.5 tab) PO q hs x 1 week then increase to 10 mg (1 tab) PO q hs, Disp: 30 tablet, Rfl: 1 .  valACYclovir (VALTREX) 1000 MG tablet, TAKE TWO TABLETS EVERY 12 H FOR ONE DAY AT ONSET OF COLD CORE (Patient not taking: Reported on 06/18/2017), Disp: 12 tablet, Rfl: 1  Review of Systems  Constitutional: Negative.   Cardiovascular: Negative.   Genitourinary: Positive for dysuria.    Social History   Tobacco Use  . Smoking status: Never Smoker  . Smokeless tobacco: Never Used  Substance Use Topics  . Alcohol use: Yes   Objective:   BP 120/80 (BP Location: Left Arm, Patient Position: Sitting, Cuff Size: Normal)   Pulse 95   Temp 98.2 F (36.8 C) (Oral)   Resp 16   Wt 176 lb 8 oz (80.1 kg)   SpO2 98%   BMI 28.92 kg/m  Vitals:   01/30/18 0823  BP: 120/80  Pulse: 95  Resp: 16  Temp: 98.2 F (36.8 C)  TempSrc: Oral  SpO2: 98%  Weight: 176 lb 8 oz (80.1 kg)     Physical Exam  Constitutional: She is oriented to person, place, and time. She appears well-developed and well-nourished. No distress.    Cardiovascular: Normal rate and regular rhythm.  Pulmonary/Chest: Effort normal and breath sounds normal.  Abdominal: Soft. Bowel sounds are normal. She exhibits no distension. There is tenderness in the suprapubic area. There is no rebound, no guarding and no CVA tenderness.  Neurological: She is alert and oriented to person, place, and time.  Skin: Skin is warm and dry. She is not diaphoretic.  Psychiatric: She has a normal mood and affect. Her behavior is normal.        Assessment & Plan:     1. Dysuria  Treat as below.  - POCT urinalysis dipstick - Urine Culture - sulfamethoxazole-trimethoprim (BACTRIM DS,SEPTRA DS) 800-160 MG tablet; Take 1 tablet by mouth 2 (two) times daily for 3 days.  Dispense: 6 tablet; Refill: 0  Return if symptoms worsen or fail to improve.  The entirety of the information documented in the History of Present Illness, Review of Systems and Physical Exam were personally obtained by me. Portions of this information were initially documented by Lynford Humphrey, CMA and reviewed by me for thoroughness and accuracy.         Trinna Post, PA-C  [facility]

## 2018-01-30 NOTE — Patient Instructions (Signed)

## 2018-02-02 LAB — URINE CULTURE

## 2018-02-04 ENCOUNTER — Telehealth: Payer: Self-pay

## 2018-02-04 NOTE — Telephone Encounter (Signed)
-----   Message from Trinna Post, [place] sent at 02/04/2018  2:01 PM EDT ----- E. Coli positive on urine culture. Sensitive to bactrim prescribed.

## 2018-02-04 NOTE — Telephone Encounter (Signed)
lmtcb

## 2018-02-05 NOTE — Telephone Encounter (Signed)
Patient advised as below. Patient verbalizes understanding and is in agreement with treatment plan.  

## 2018-03-06 ENCOUNTER — Ambulatory Visit (INDEPENDENT_AMBULATORY_CARE_PROVIDER_SITE_OTHER): Payer: BC Managed Care – PPO | Admitting: Physician Assistant

## 2018-03-06 ENCOUNTER — Encounter: Payer: BC Managed Care – PPO | Admitting: Physician Assistant

## 2018-03-06 ENCOUNTER — Encounter: Payer: Self-pay | Admitting: Physician Assistant

## 2018-03-06 VITALS — BP 120/80 | HR 97 | Temp 98.7°F | Resp 16 | Ht 65.0 in | Wt 187.0 lb

## 2018-03-06 DIAGNOSIS — Z1211 Encounter for screening for malignant neoplasm of colon: Secondary | ICD-10-CM | POA: Diagnosis not present

## 2018-03-06 DIAGNOSIS — E66811 Obesity, class 1: Secondary | ICD-10-CM | POA: Insufficient documentation

## 2018-03-06 DIAGNOSIS — J301 Allergic rhinitis due to pollen: Secondary | ICD-10-CM

## 2018-03-06 DIAGNOSIS — E6609 Other obesity due to excess calories: Secondary | ICD-10-CM | POA: Diagnosis not present

## 2018-03-06 DIAGNOSIS — Z6831 Body mass index (BMI) 31.0-31.9, adult: Secondary | ICD-10-CM

## 2018-03-06 DIAGNOSIS — Z2821 Immunization not carried out because of patient refusal: Secondary | ICD-10-CM

## 2018-03-06 DIAGNOSIS — Z0000 Encounter for general adult medical examination without abnormal findings: Secondary | ICD-10-CM | POA: Diagnosis not present

## 2018-03-06 DIAGNOSIS — F419 Anxiety disorder, unspecified: Secondary | ICD-10-CM

## 2018-03-06 DIAGNOSIS — Z800 Family history of malignant neoplasm of digestive organs: Secondary | ICD-10-CM | POA: Diagnosis not present

## 2018-03-06 DIAGNOSIS — Z114 Encounter for screening for human immunodeficiency virus [HIV]: Secondary | ICD-10-CM

## 2018-03-06 DIAGNOSIS — Z8619 Personal history of other infectious and parasitic diseases: Secondary | ICD-10-CM

## 2018-03-06 DIAGNOSIS — F329 Major depressive disorder, single episode, unspecified: Secondary | ICD-10-CM

## 2018-03-06 MED ORDER — VALACYCLOVIR HCL 1 G PO TABS: ORAL_TABLET | ORAL | 1 refills | Status: DC

## 2018-03-06 MED ORDER — ALPRAZOLAM 0.25 MG PO TABS: 0.2500 mg | ORAL_TABLET | Freq: Two times a day (BID) | ORAL | 1 refills | Status: DC | PRN

## 2018-03-06 MED ORDER — MONTELUKAST SODIUM 10 MG PO TABS: ORAL_TABLET | ORAL | 1 refills | Status: DC

## 2018-03-06 MED ORDER — ESCITALOPRAM OXALATE 10 MG PO TABS: 10.0000 mg | ORAL_TABLET | Freq: Every day | ORAL | 1 refills | Status: DC

## 2018-03-06 NOTE — Patient Instructions (Signed)

## 2018-03-06 NOTE — Progress Notes (Signed)
Patient: Jody Nolan, Female    DOB: 06/11/1965, 52 y.o.   MRN: [id number] Visit Date: 03/06/2018  Today's Provider: Mar Daring, PA-C   Chief Complaint  Patient presents with  . Annual Exam   Subjective:    Annual physical exam Jody Nolan is a 52 y.o. female who presents today for health maintenance and complete physical. She feels well. She reports exercising none. She reports she is sleeping poorly.  Pap:03/01/2016-Normal-Repeat 3-5 yrs. 11/15/17: [place] right breast BI-RADS 3-Repeat Bilateral diagnostic mammograms with right breast ultrasound in 6 months. ----------------------------------------------------------------- Patient Declined Influenza Vaccine.  Review of Systems  Constitutional: Negative.   HENT: Negative.   Eyes: Negative.   Respiratory: Negative.   Cardiovascular: Negative.   Gastrointestinal: Negative.   Endocrine: Negative.   Genitourinary: Negative.   Musculoskeletal: Negative.   Skin: Negative.   Allergic/Immunologic: Positive for environmental allergies.  Neurological: Positive for numbness ("hands").  Hematological: Negative.   Psychiatric/Behavioral: Positive for sleep disturbance. The patient is nervous/anxious.     Social History      She  reports that she has never smoked. She has never used smokeless tobacco. She reports that she drinks alcohol. She reports that she does not use drugs.       Social History   Socioeconomic History  . Marital status: Married    Spouse name: Not on file  . Number of children: 2  . Years of education: [employer]  . Highest education level: Not on file  Occupational History  . 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  Social Needs  . Financial resource strain: Not on file  . Food insecurity:    Worry: Not on file    Inability: Not on file  . Transportation needs:    Medical: Not on file    Non-medical: Not on file    Tobacco Use  . Smoking status: Never Smoker  . Smokeless tobacco: Never Used  Substance and Sexual Activity  . Alcohol use: Yes  . Drug use: No  . Sexual activity: Yes  Lifestyle  . Physical activity:    Days per week: Not on file    Minutes per session: Not on file  . Stress: Not on file  Relationships  . Social connections:    Talks on phone: Not on file    Gets together: Not on file    Attends religious service: Not on file    Active member of club or organization: Not on file    Attends meetings of clubs or organizations: Not on file    Relationship status: Not on file  Other Topics Concern  . Not on file  Social History Narrative  . Not on file    Past Medical History:  Diagnosis Date  . Allergic rhinitis   . Degeneration of cervical intervertebral disc   . DUB (dysfunctional uterine bleeding)   . Family history of colon cancer   . History of abnormal cervical Pap smear   . History of chicken pox   . Radiculopathy   . Reaction, stress, acute   . Shoulder pain      Patient Active Problem List   Diagnosis Date Noted  . Allergic rhinitis 01/17/2016  . Degeneration of cervical intervertebral disc 01/17/2016  . Dysfunctional uterine bleeding 01/17/2016  . Family history of GI tract cancer 01/17/2016  . Vaginal Pap smear, abnormal 01/17/2016  . Radiculopathy 01/17/2016  .  Family history of colon cancer 07/19/2009  . Uncomplicated herpes simplex 07/19/2009    Past Surgical History:  Procedure Laterality Date  . CESAREAN SECTION  2000  . LEEP  20's    Family History        Family Status  Relation Name Status  . Mother  Alive  . Father  Deceased at age 57       colon cancer  . Brother  Alive  . Daughter  Alive  . Son  Alive  . MGM  Alive, age 98y  . PGM  Alive, age 21y        Her family history includes Cancer in her father; Hypertension in her brother and paternal grandmother; Stroke in her paternal grandmother.      No Known  Allergies   Current Outpatient Medications:  .  ALPRAZolam (XANAX) 0.25 MG tablet, Take 1 tablet (0.25 mg total) by mouth 2 (two) times daily as needed for anxiety., Disp: 60 tablet, Rfl: 0 .  escitalopram (LEXAPRO) 10 MG tablet, Start with 5mg  (0.5 tab) PO q hs x 1 week then increase to 10 mg (1 tab) PO q hs, Disp: 30 tablet, Rfl: 1 .  montelukast (SINGULAIR) 10 MG tablet, TAKE 1 TABLET BY MOUTH EVERYDAY AT BEDTIME, Disp: , Rfl: 1 .  valACYclovir (VALTREX) 1000 MG tablet, TAKE TWO TABLETS EVERY 12 H FOR ONE DAY AT ONSET OF COLD CORE, Disp: 12 tablet, Rfl: 1 .  Cetirizine HCl (ZYRTEC ALLERGY) 10 MG CAPS, Take 1 tablet by mouth daily., Disp: , Rfl:    Patient Care Team: Mar Daring, PA-C as PCP - General (Family Medicine)      Objective:   Vitals: BP 120/80 (BP Location: Left Arm, Patient Position: Sitting, Cuff Size: Normal)   Pulse 97   Temp 98.7 F (37.1 C) (Oral)   Resp 16   Ht 5\' 5"  (1.651 m)   Wt 187 lb (84.8 kg)   SpO2 98%   BMI 31.12 kg/m    Vitals:   03/06/18 1513  BP: 120/80  Pulse: 97  Resp: 16  Temp: 98.7 F (37.1 C)  TempSrc: Oral  SpO2: 98%  Weight: 187 lb (84.8 kg)  Height: 5\' 5"  (1.651 m)     Physical Exam  Constitutional: She is oriented to person, place, and time. She appears well-developed and well-nourished. No distress.  HENT:  Head: Normocephalic and atraumatic.  Right Ear: Hearing, tympanic membrane, external ear and ear canal normal.  Left Ear: Hearing, tympanic membrane, external ear and ear canal normal.  Nose: Nose normal.  Mouth/Throat: Uvula is midline, oropharynx is clear and moist and mucous membranes are normal. No oropharyngeal exudate.  Eyes: Pupils are equal, round, and reactive to light. Conjunctivae and EOM are normal. Right eye exhibits no discharge. Left eye exhibits no discharge. No scleral icterus.  Neck: Normal range of motion. Neck supple. No JVD present. Carotid bruit is not present. No tracheal deviation present. No  thyromegaly present.  Cardiovascular: Normal rate, regular rhythm, normal heart sounds and intact distal pulses. Exam reveals no gallop and no friction rub.  No murmur heard. Pulmonary/Chest: Effort normal and breath sounds normal. No respiratory distress. She has no wheezes. She has no rales. She exhibits no tenderness.  Abdominal: Soft. Bowel sounds are normal. She exhibits no distension and no mass. There is no tenderness. There is no rebound and no guarding.  Musculoskeletal: Normal range of motion. She exhibits no edema or tenderness.  Lymphadenopathy:  She has no cervical adenopathy.  Neurological: She is alert and oriented to person, place, and time.  Skin: Skin is warm and dry. No rash noted. She is not diaphoretic.  Psychiatric: She has a normal mood and affect. Her behavior is normal. Judgment and thought content normal.  Vitals reviewed.    Depression Screen PHQ 2/9 Scores 03/06/2018 01/21/2018 03/02/2017 06/26/2016  PHQ - 2 Score 3 1 0 0  PHQ- 9 Score 13 12 2  0      Assessment & Plan:     Routine Health Maintenance and Physical Exam  Exercise Activities and Dietary recommendations Goals   None     Immunization History  Administered Date(s) Administered  . Td 06/28/2010  . Tdap 06/28/2010    Health Maintenance  Topic Date Due  . HIV Screening  10/21/1982  . COLONOSCOPY  10/20/2017  . INFLUENZA VACCINE  07/23/2019 (Originally 11/22/2017)  . PAP SMEAR  03/02/2019  . MAMMOGRAM  05/12/2019  . TETANUS/TDAP  06/27/2020     Discussed health benefits of physical activity, and encouraged her to engage in regular exercise appropriate for her age and condition.    1. Annual physical exam Normal physical exam today. Will check labs as below and f/u pending lab results. If labs are stable and WNL she will not need to have these rechecked for one year at her next annual physical exam. She is to call the office in the meantime if she has any acute issue, questions or  concerns. - CBC with Differential/Platelet - Comprehensive metabolic panel - Hemoglobin A1c - Lipid panel - TSH  2. Screening for colon cancer Due for colonoscopy. Father passed at 14 due to colon cancer.  - Ambulatory referral to Gastroenterology  3. Family history of colon cancer Father at 11.  - Ambulatory referral to Gastroenterology  4. Class 1 obesity due to excess calories without serious comorbidity with body mass index (BMI) of 31.0 to 31.9 in adult Counseled patient on healthy lifestyle modifications including dieting and exercise. Will check labs as below and f/u pending results. - Comprehensive metabolic panel - Hemoglobin A1c - Lipid panel  5. Screening for HIV without presence of risk factors Will check labs as below and f/u pending results. - HIV Antibody (routine testing w rflx)  6. Anxiety and depression Stable. Diagnosis pulled for medication refill. Continue current medical treatment plan. - escitalopram (LEXAPRO) 10 MG tablet; Take 1 tablet (10 mg total) by mouth at bedtime. Start with 5mg  (0.5 tab) PO q hs x 1 week then increase to 10 mg (1 tab) PO q hs  Dispense: 90 tablet; Refill: 1 - ALPRAZolam (XANAX) 0.25 MG tablet; Take 1 tablet (0.25 mg total) by mouth 2 (two) times daily as needed for anxiety.  Dispense: 180 tablet; Refill: 1  7. Seasonal allergic rhinitis due to pollen Stable. Diagnosis pulled for medication refill. Continue current medical treatment plan. - montelukast (SINGULAIR) 10 MG tablet; TAKE 1 TABLET BY MOUTH EVERYDAY AT BEDTIME  Dispense: 90 tablet; Refill: 1  8. H/O cold sores Stable. Diagnosis pulled for medication refill. Continue current medical treatment plan. - valACYclovir (VALTREX) 1000 MG tablet; Take 2 tabs PO BID at onset cold sore  Dispense: 12 tablet; Refill: 1  9. Influenza vaccination declined  --------------------------------------------------------------------    Mar Daring, PA-C  [facility]

## 2018-03-11 ENCOUNTER — Telehealth: Payer: Self-pay

## 2018-03-11 NOTE — Telephone Encounter (Signed)
LVM returning patients call to schedule colonoscopy.  Thanks Joliet Mallozzi 

## 2018-03-12 ENCOUNTER — Other Ambulatory Visit: Payer: Self-pay | Admitting: Physician Assistant

## 2018-03-12 DIAGNOSIS — F329 Major depressive disorder, single episode, unspecified: Secondary | ICD-10-CM

## 2018-03-12 DIAGNOSIS — F419 Anxiety disorder, unspecified: Principal | ICD-10-CM

## 2018-03-12 LAB — LIPID PANEL
CHOL/HDL RATIO: 3.2 ratio (ref 0.0–4.4)
Cholesterol, Total: 238 mg/dL — ABNORMAL HIGH (ref 100–199)
HDL: 74 mg/dL (ref >39)
LDL CALC: 124 mg/dL — AB (ref 0–99)
Triglycerides: 199 mg/dL — ABNORMAL HIGH (ref 0–149)
VLDL Cholesterol Cal: 40 mg/dL (ref 5–40)

## 2018-03-12 LAB — CBC WITH DIFFERENTIAL/PLATELET
BASOS ABS: 0.1 10*3/uL (ref 0.0–0.2)
Basos: 1 %
EOS (ABSOLUTE): 0.3 10*3/uL (ref 0.0–0.4)
Eos: 4 %
Hematocrit: 44.4 % (ref 34.0–46.6)
Hemoglobin: 14.5 g/dL (ref 11.1–15.9)
Immature Grans (Abs): 0 10*3/uL (ref 0.0–0.1)
Immature Granulocytes: 1 %
LYMPHS ABS: 1.6 10*3/uL (ref 0.7–3.1)
Lymphs: 24 %
MCH: 30 pg (ref 26.6–33.0)
MCHC: 32.7 g/dL (ref 31.5–35.7)
MCV: 92 fL (ref 79–97)
Monocytes Absolute: 0.5 10*3/uL (ref 0.1–0.9)
Monocytes: 8 %
Neutrophils Absolute: 4.3 10*3/uL (ref 1.4–7.0)
Neutrophils: 62 %
PLATELETS: 335 10*3/uL (ref 150–450)
RBC: 4.84 x10E6/uL (ref 3.77–5.28)
RDW: 12.6 % (ref 12.3–15.4)
WBC: 6.8 10*3/uL (ref 3.4–10.8)

## 2018-03-12 LAB — COMPREHENSIVE METABOLIC PANEL
ALK PHOS: 61 IU/L (ref 39–117)
ALT: 15 IU/L (ref 0–32)
AST: 16 IU/L (ref 0–40)
Albumin/Globulin Ratio: 1.9 (ref 1.2–2.2)
Albumin: 4.4 g/dL (ref 3.5–5.5)
BILIRUBIN TOTAL: 0.4 mg/dL (ref 0.0–1.2)
BUN / CREAT RATIO: 18 (ref 9–23)
BUN: 16 mg/dL (ref 6–24)
CHLORIDE: 104 mmol/L (ref 96–106)
CO2: 19 mmol/L — ABNORMAL LOW (ref 20–29)
Calcium: 9.4 mg/dL (ref 8.7–10.2)
Creatinine, Ser: 0.89 mg/dL (ref 0.57–1.00)
GFR calc Af Amer: 87 mL/min/{1.73_m2} (ref >59)
GFR calc non Af Amer: 76 mL/min/{1.73_m2} (ref >59)
GLUCOSE: 87 mg/dL (ref 65–99)
Globulin, Total: 2.3 g/dL (ref 1.5–4.5)
Potassium: 4.3 mmol/L (ref 3.5–5.2)
Sodium: 140 mmol/L (ref 134–144)
Total Protein: 6.7 g/dL (ref 6.0–8.5)

## 2018-03-12 LAB — TSH: TSH: 4.09 u[IU]/mL (ref 0.450–4.500)

## 2018-03-12 LAB — HEMOGLOBIN A1C
Est. average glucose Bld gHb Est-mCnc: 105 mg/dL
HEMOGLOBIN A1C: 5.3 % (ref 4.8–5.6)

## 2018-03-12 LAB — HIV ANTIBODY (ROUTINE TESTING W REFLEX): HIV SCREEN 4TH GENERATION: NONREACTIVE

## 2018-03-13 ENCOUNTER — Other Ambulatory Visit: Payer: Self-pay | Admitting: Physician Assistant

## 2018-03-13 ENCOUNTER — Telehealth: Payer: Self-pay

## 2018-03-13 DIAGNOSIS — F329 Major depressive disorder, single episode, unspecified: Secondary | ICD-10-CM

## 2018-03-13 DIAGNOSIS — F419 Anxiety disorder, unspecified: Principal | ICD-10-CM

## 2018-03-13 MED ORDER — ESCITALOPRAM OXALATE 10 MG PO TABS: 10.0000 mg | ORAL_TABLET | Freq: Every day | ORAL | 1 refills | Status: DC

## 2018-03-13 NOTE — Addendum Note (Signed)
Addended by: Mar Daring on: 03/13/2018 07:54 AM   Modules accepted: Orders

## 2018-03-13 NOTE — Telephone Encounter (Signed)
[id number]-KW 

## 2018-03-13 NOTE — Telephone Encounter (Signed)
-----   Message from Mar Daring, [place] sent at 03/13/2018  1:56 PM EST ----- All labs are within normal limits and stable.  Thanks! -JB

## 2018-03-14 NOTE — Telephone Encounter (Signed)
Pt advised.   Thanks,   -Laura  

## 2018-03-15 ENCOUNTER — Other Ambulatory Visit: Payer: Self-pay

## 2018-03-15 DIAGNOSIS — Z1211 Encounter for screening for malignant neoplasm of colon: Secondary | ICD-10-CM

## 2018-03-20 ENCOUNTER — Encounter: Payer: Self-pay | Admitting: *Deleted

## 2018-03-20 ENCOUNTER — Other Ambulatory Visit: Payer: Self-pay

## 2018-03-27 NOTE — Discharge Instructions (Signed)
General Anesthesia, Adult, Care After °These instructions provide you with information about caring for yourself after your procedure. Your health care provider may also give you more specific instructions. Your treatment has been planned according to current medical practices, but problems sometimes occur. Call your health care provider if you have any problems or questions after your procedure. °What can I expect after the procedure? °After the procedure, it is common to have: °· Vomiting. °· A sore throat. °· Mental slowness. ° °It is common to feel: °· Nauseous. °· Cold or shivery. °· Sleepy. °· Tired. °· Sore or achy, even in parts of your body where you did not have surgery. ° °Follow these instructions at home: °For at least 24 hours after the procedure: °· Do not: °? Participate in activities where you could fall or become injured. °? Drive. °? Use heavy machinery. °? Drink alcohol. °? Take sleeping pills or medicines that cause drowsiness. °? Make important decisions or sign legal documents. °? Take care of children on your own. °· Rest. °Eating and drinking °· If you vomit, drink water, juice, or soup when you can drink without vomiting. °· Drink enough fluid to keep your urine clear or pale yellow. °· Make sure you have little or no nausea before eating solid foods. °· Follow the diet recommended by your health care provider. °General instructions °· Have a responsible adult stay with you until you are awake and alert. °· Return to your normal activities as told by your health care provider. Ask your health care provider what activities are safe for you. °· Take over-the-counter and prescription medicines only as told by your health care provider. °· If you smoke, do not smoke without supervision. °· Keep all follow-up visits as told by your health care provider. This is important. °Contact a health care provider if: °· You continue to have nausea or vomiting at home, and medicines are not helpful. °· You  cannot drink fluids or start eating again. °· You cannot urinate after 8-12 hours. °· You develop a skin rash. °· You have fever. °· You have increasing redness at the site of your procedure. °Get help right away if: °· You have difficulty breathing. °· You have chest pain. °· You have unexpected bleeding. °· You feel that you are having a life-threatening or urgent problem. °This information is not intended to replace advice given to you by your health care provider. Make sure you discuss any questions you have with your health care provider. °Document Released: 07/17/2000 Document Revised: 09/13/2015 Document Reviewed: 03/25/2015 °Elsevier Interactive Patient Education © 2018 Elsevier Inc. ° °

## 2018-03-29 ENCOUNTER — Ambulatory Visit: Payer: BC Managed Care – PPO | Admitting: Anesthesiology

## 2018-03-29 ENCOUNTER — Ambulatory Visit
Admission: RE | Admit: 2018-03-29 | Discharge: 2018-03-29 | Disposition: A | Discharge status: Home / Self Care | Payer: BC Managed Care – PPO | Source: Ambulatory Visit | Attending: Gastroenterology | Admitting: Gastroenterology

## 2018-03-29 ENCOUNTER — Encounter
Admission: RE | Disposition: A | Discharge status: Home / Self Care | Payer: Self-pay | Source: Ambulatory Visit | Attending: Gastroenterology

## 2018-03-29 DIAGNOSIS — K640 First degree hemorrhoids: Secondary | ICD-10-CM | POA: Diagnosis not present

## 2018-03-29 DIAGNOSIS — K635 Polyp of colon: Secondary | ICD-10-CM

## 2018-03-29 DIAGNOSIS — M5030 Other cervical disc degeneration, unspecified cervical region: Secondary | ICD-10-CM | POA: Insufficient documentation

## 2018-03-29 DIAGNOSIS — Z800 Family history of malignant neoplasm of digestive organs: Secondary | ICD-10-CM | POA: Insufficient documentation

## 2018-03-29 DIAGNOSIS — Z8249 Family history of ischemic heart disease and other diseases of the circulatory system: Secondary | ICD-10-CM | POA: Diagnosis not present

## 2018-03-29 DIAGNOSIS — D125 Benign neoplasm of sigmoid colon: Secondary | ICD-10-CM | POA: Insufficient documentation

## 2018-03-29 DIAGNOSIS — Z1211 Encounter for screening for malignant neoplasm of colon: Secondary | ICD-10-CM | POA: Diagnosis present

## 2018-03-29 HISTORY — PX: COLONOSCOPY WITH PROPOFOL: SHX5780

## 2018-03-29 HISTORY — PX: POLYPECTOMY: SHX5525

## 2018-03-29 HISTORY — DX: Family history of other specified conditions: Z84.89

## 2018-03-29 SURGERY — COLONOSCOPY WITH PROPOFOL
Anesthesia: General | Site: Rectum

## 2018-03-29 MED ORDER — ACETAMINOPHEN 160 MG/5ML PO SOLN: 325.0000 mg | ORAL | Status: DC | PRN

## 2018-03-29 MED ORDER — LIDOCAINE HCL (CARDIAC) PF 100 MG/5ML IV SOSY
PREFILLED_SYRINGE | INTRAVENOUS | Status: DC | PRN
  Administered 2018-03-29: 30 mg via INTRAVENOUS

## 2018-03-29 MED ORDER — SODIUM CHLORIDE 0.9 % IV SOLN: INTRAVENOUS | Status: DC

## 2018-03-29 MED ORDER — STERILE WATER FOR IRRIGATION IR SOLN
Status: DC | PRN
  Administered 2018-03-29: 10:00:00

## 2018-03-29 MED ORDER — ONDANSETRON HCL 4 MG/2ML IJ SOLN: 4.0000 mg | Freq: Once | INTRAMUSCULAR | Status: DC | PRN

## 2018-03-29 MED ORDER — ACETAMINOPHEN 325 MG PO TABS: 650.0000 mg | ORAL_TABLET | Freq: Once | ORAL | Status: DC | PRN

## 2018-03-29 MED ORDER — LACTATED RINGERS IV SOLN
INTRAVENOUS | Status: DC
  Administered 2018-03-29: 09:00:00 via INTRAVENOUS

## 2018-03-29 MED ORDER — PROPOFOL 10 MG/ML IV BOLUS
INTRAVENOUS | Status: DC | PRN
  Administered 2018-03-29: 120 mg via INTRAVENOUS
  Administered 2018-03-29: 40 mg via INTRAVENOUS
  Administered 2018-03-29: 30 mg via INTRAVENOUS
  Administered 2018-03-29: 40 mg via INTRAVENOUS
  Administered 2018-03-29: 20 mg via INTRAVENOUS

## 2018-03-29 SURGICAL SUPPLY — 7 items
CANISTER SUCT 1200ML W/VALVE (MISCELLANEOUS) ×3 IMPLANT
GOWN CVR UNV OPN BCK APRN NK (MISCELLANEOUS) ×2 IMPLANT
GOWN ISOL THUMB LOOP REG UNIV (MISCELLANEOUS) ×4
KIT ENDO PROCEDURE OLY (KITS) ×3 IMPLANT
SNARE SHORT THROW 13M SML OVAL (MISCELLANEOUS) ×3 IMPLANT
TRAP ETRAP POLY (MISCELLANEOUS) ×3 IMPLANT
WATER STERILE IRR 250ML POUR (IV SOLUTION) ×3 IMPLANT

## 2018-03-29 NOTE — H&P (Signed)
[name], MD [facility] [address] [place], [place] [address] Phone: [phone] Fax : [phone]  Primary Care Physician:  Mar Daring, PA-C Primary Gastroenterologist:  Dr. Allen Norris  Pre-Procedure History & Physical: HPI:  Jody Nolan is a 52 y.o. female is here for a screening colonoscopy.   Past Medical History:  Diagnosis Date  . Allergic rhinitis   . Degeneration of cervical intervertebral disc   . DUB (dysfunctional uterine bleeding)   . Family history of adverse reaction to anesthesia    Mothr - PONV  . Family history of colon cancer   . History of abnormal cervical Pap smear   . History of chicken pox   . Radiculopathy   . Reaction, stress, acute   . Shoulder pain     Past Surgical History:  Procedure Laterality Date  . ABDOMINAL HYSTERECTOMY     partial  . CESAREAN SECTION  2000  . LEEP  1990's    Prior to Admission medications   Medication Sig Start Date End Date Taking? Authorizing Provider  ALPRAZolam (XANAX) 0.25 MG tablet Take 1 tablet (0.25 mg total) by mouth 2 (two) times daily as needed for anxiety. 03/06/18  Yes Mar Daring, PA-C  Cetirizine HCl (ZYRTEC ALLERGY) 10 MG CAPS Take 1 tablet by mouth daily.   Yes [provider]  escitalopram (LEXAPRO) 10 MG tablet Take 1 tablet (10 mg total) by mouth at bedtime. 03/13/18  Yes Fenton Malling M, PA-C  montelukast (SINGULAIR) 10 MG tablet TAKE 1 TABLET BY MOUTH EVERYDAY AT BEDTIME 03/06/18  Yes Mar Daring, PA-C  valACYclovir (VALTREX) 1000 MG tablet Take 2 tabs PO BID at onset cold sore 03/06/18  Yes Mar Daring, PA-C    Allergies as of 03/15/2018  . (No Known Allergies)    Family History  Problem Relation Age of Onset  . Cancer Father   . Hypertension Brother   . Stroke Paternal Grandmother   . Hypertension Paternal Grandmother     Social History   Socioeconomic History  . Marital status: Married    Spouse name: Not on file  .  Number of children: 2  . Years of education: [employer]  . Highest education level: Not on file  Occupational History  . 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  Social Needs  . Financial resource strain: Not on file  . Food insecurity:    Worry: Not on file    Inability: Not on file  . Transportation needs:    Medical: Not on file    Non-medical: Not on file  Tobacco Use  . Smoking status: Never Smoker  . Smokeless tobacco: Never Used  Substance and Sexual Activity  . Alcohol use: Yes    Alcohol/week: 1.0 standard drinks    Types: 1 Standard drinks or equivalent per week  . Drug use: No  . Sexual activity: Yes  Lifestyle  . Physical activity:    Days per week: Not on file    Minutes per session: Not on file  . Stress: Not on file  Relationships  . Social connections:    Talks on phone: Not on file    Gets together: Not on file    Attends religious service: Not on file    Active member of club or organization: Not on file    Attends meetings of clubs or organizations: Not on file    Relationship status: Not  on file  . Intimate partner violence:    Fear of current or ex partner: Not on file    Emotionally abused: Not on file    Physically abused: Not on file    Forced sexual activity: Not on file  Other Topics Concern  . Not on file  Social History Narrative  . Not on file    Review of Systems: See HPI, otherwise negative ROS  Physical Exam: BP 117/84   Pulse 95   Temp 98.1 F (36.7 C) (Temporal)   Resp 15   Ht 5\' 5"  (1.651 m)   Wt 82.1 kg   SpO2 99%   BMI 30.12 kg/m  General:   Alert,  pleasant and cooperative in NAD Head:  Normocephalic and atraumatic. Neck:  Supple; no masses or thyromegaly. Lungs:  Clear throughout to auscultation.    Heart:  Regular rate and rhythm. Abdomen:  Soft, nontender and nondistended. Normal bowel sounds, without guarding, and without rebound.   Neurologic:   Alert and  oriented x4;  grossly normal neurologically.  Impression/Plan: Jody Nolan is now here to undergo a screening colonoscopy.  Risks, benefits, and alternatives regarding colonoscopy have been reviewed with the patient.  Questions have been answered.  All parties agreeable.

## 2018-03-29 NOTE — Anesthesia Postprocedure Evaluation (Signed)
Anesthesia Post Note  Patient: Jody Nolan  Procedure(s) Performed: COLONOSCOPY WITH BIOPSY (N/A Rectum) POLYPECTOMY (N/A Rectum)  Patient location during evaluation: PACU Anesthesia Type: General Level of consciousness: awake and alert, oriented and patient cooperative Pain management: pain level controlled Vital Signs Assessment: post-procedure vital signs reviewed and stable Respiratory status: spontaneous breathing, nonlabored ventilation and respiratory function stable Cardiovascular status: blood pressure returned to baseline and stable Postop Assessment: adequate PO intake Anesthetic complications: no    Darrin Nipper

## 2018-03-29 NOTE — Transfer of Care (Signed)
Immediate Anesthesia Transfer of Care Note  Patient: Jody Nolan  Procedure(s) Performed: COLONOSCOPY WITH BIOPSIES (N/A Rectum) POLYPECTOMY (N/A Rectum)  Patient Location: PACU  Anesthesia Type: General  Level of Consciousness: awake, alert  and patient cooperative  Airway and Oxygen Therapy: Patient Spontanous Breathing and Patient connected to supplemental oxygen  Post-op Assessment: Post-op Vital signs reviewed, Patient's Cardiovascular Status Stable, Respiratory Function Stable, Patent Airway and No signs of Nausea or vomiting  Post-op Vital Signs: Reviewed and stable  Complications: No apparent anesthesia complications

## 2018-03-29 NOTE — Anesthesia Procedure Notes (Signed)
Date/Time: 03/29/2018 10:09 AM Performed by: Cameron Ali, CRNA Pre-anesthesia Checklist: Patient identified, Emergency Drugs available, Suction available, Timeout performed and Patient being monitored Patient Re-evaluated:Patient Re-evaluated prior to induction Oxygen Delivery Method: Nasal cannula Placement Confirmation: positive ETCO2

## 2018-03-29 NOTE — Op Note (Signed)
[facility]astroenter[facility] Patient Name: Jody Nolan Procedure Date: 03/29/2018 10:05 AM MRN: [id number] Account #: [id number] Date of Birth: [birth year] Admit Type: Outpatient Age: 52 Room: [facility] OR ROOM 01 Gender: Female Note Status: Finalized Procedure:            Colonoscopy Indications:          Screening for colorectal malignant neoplasm Providers:            [name] MD, MD Referring MD:         Kirstie Peri. Caryn Section, MD (Referring MD) Medicines:            Propofol per Anesthesia Complications:        No immediate complications. Procedure:            Pre-Anesthesia Assessment:                       - Prior to the procedure, a History and Physical was                        performed, and patient medications and allergies were                        reviewed. The patient's tolerance of previous                        anesthesia was also reviewed. The risks and benefits of                        the procedure and the sedation options and risks were                        discussed with the patient. All questions were                        answered, and informed consent was obtained. Prior                        Anticoagulants: The patient has taken no previous                        anticoagulant or antiplatelet agents. ASA Grade                        Assessment: II - A patient with mild systemic disease.                        After reviewing the risks and benefits, the patient was                        deemed in satisfactory condition to undergo the                        procedure.                       After obtaining informed consent, the colonoscope was                        passed under direct vision. Throughout the procedure,  the patient's blood pressure, pulse, and oxygen                        saturations were monitored continuously. The was                        introduced through the anus and advanced to the the                    cecum, identified by appendiceal orifice and ileocecal                        valve. The colonoscopy was performed without                        difficulty. The patient tolerated the procedure well.                        The quality of the bowel preparation was excellent. Findings:      The perianal and digital rectal examinations were normal.      A 4 mm polyp was found in the sigmoid colon. The polyp was sessile. The       polyp was removed with a cold snare. Resection and retrieval were       complete.      Non-bleeding internal hemorrhoids were found during retroflexion. The       hemorrhoids were Grade I (internal hemorrhoids that do not prolapse). Impression:           - One 4 mm polyp in the sigmoid colon, removed with a                        cold snare. Resected and retrieved.                       - Non-bleeding internal hemorrhoids. Recommendation:       - Discharge patient to home.                       - Resume previous diet.                       - Continue present medications.                       - Await pathology results.                       - Repeat colonoscopy in 5 years if polyp adenoma and 10                        years if hyperplastic Procedure Code(s):    --- Professional ---                       [phone], Colonoscopy, flexible; with removal of tumor(s),                        polyp(s), or other lesion(s) by snare technique Diagnosis Code(s):    --- Professional ---                       Z12.11, Encounter for screening for malignant  neoplasm                        of colon                       D12.5, Benign neoplasm of sigmoid colon CPT copyright 2018 American Medical Association. All rights reserved. The codes documented in this report are preliminary and upon coder review may  be revised to meet current compliance requirements. [name] MD, MD 03/29/2018 10:27:14 AM This report has been signed electronically. Number of Addenda: 0 Note  Initiated On: 03/29/2018 10:05 AM Scope Withdrawal Time: 0 hours 6 minutes 49 seconds  Total Procedure Duration: 0 hours 11 minutes 56 seconds       [facility]

## 2018-03-29 NOTE — Anesthesia Preprocedure Evaluation (Signed)
Anesthesia Evaluation  Patient identified by MRN, date of birth, ID band Patient awake    Reviewed: Allergy & Precautions, NPO status , Patient's Chart, lab work & pertinent test results  History of Anesthesia Complications (+) Family history of anesthesia reaction and history of anesthetic complications (mother with PONV)  Airway Mallampati: I   Neck ROM: Full    Dental  (+)    Pulmonary neg pulmonary ROS,    Pulmonary exam normal breath sounds clear to auscultation       Cardiovascular Exercise Tolerance: Good negative cardio ROS Normal cardiovascular exam Rhythm:Regular Rate:Normal     Neuro/Psych PSYCHIATRIC DISORDERS Anxiety Depression negative neurological ROS     GI/Hepatic negative GI ROS,   Endo/Other  negative endocrine ROS  Renal/GU negative Renal ROS     Musculoskeletal   Abdominal   Peds  Hematology negative hematology ROS (+)   Anesthesia Other Findings   Reproductive/Obstetrics                             Anesthesia Physical Anesthesia Plan  ASA: I  Anesthesia Plan: General   Post-op Pain Management:    Induction: Intravenous  PONV Risk Score and Plan: 3 and TIVA and Propofol infusion  Airway Management Planned: Natural Airway  Additional Equipment:   Intra-op Plan:   Post-operative Plan:   Informed Consent: I have reviewed the patients History and Physical, chart, labs and discussed the procedure including the risks, benefits and alternatives for the proposed anesthesia with the patient or authorized representative who has indicated his/her understanding and acceptance.     Plan Discussed with: CRNA  Anesthesia Plan Comments:         Anesthesia Quick Evaluation

## 2018-04-01 ENCOUNTER — Encounter: Payer: Self-pay | Admitting: Gastroenterology

## 2018-04-03 ENCOUNTER — Encounter: Payer: Self-pay | Admitting: Gastroenterology

## 2018-04-04 ENCOUNTER — Encounter: Payer: Self-pay | Admitting: Gastroenterology

## 2018-04-09 ENCOUNTER — Encounter: Payer: Self-pay | Admitting: Physician Assistant

## 2018-04-09 ENCOUNTER — Telehealth: Payer: Self-pay

## 2018-04-09 ENCOUNTER — Ambulatory Visit: Payer: BC Managed Care – PPO | Admitting: Physician Assistant

## 2018-04-09 VITALS — BP 118/80 | HR 93 | Temp 98.2°F | Wt 194.4 lb

## 2018-04-09 DIAGNOSIS — R3989 Other symptoms and signs involving the genitourinary system: Secondary | ICD-10-CM | POA: Diagnosis not present

## 2018-04-09 LAB — POCT URINALYSIS DIPSTICK
Bilirubin, UA: NEGATIVE
Glucose, UA: NEGATIVE
Ketones, UA: NEGATIVE
Nitrite, UA: POSITIVE
Protein, UA: POSITIVE — AB
Spec Grav, UA: 1.015 (ref 1.010–1.025)
Urobilinogen, UA: 0.2 E.U./dL
pH, UA: 6.5 (ref 5.0–8.0)

## 2018-04-09 MED ORDER — SULFAMETHOXAZOLE-TRIMETHOPRIM 800-160 MG PO TABS: 1.0000 | ORAL_TABLET | Freq: Two times a day (BID) | ORAL | 0 refills | Status: AC

## 2018-04-09 NOTE — Patient Instructions (Signed)

## 2018-04-09 NOTE — Progress Notes (Signed)
Patient: Jody Nolan Female    DOB: [birth year]   52 y.o.   MRN: [id number] Visit Date: 04/10/2018  Today's Provider: Trinna Post, PA-C   Chief Complaint  Patient presents with  . Urinary Tract Infection   Subjective:   Underwent colonoscopy on 03/29/2018 and prep the day prior.   Urinary Tract Infection   The current episode started today. The problem has been gradually worsening. The quality of the pain is described as burning. The pain is mild. There has been no fever. Associated symptoms include hematuria and urgency. Her past medical history is significant for recurrent UTIs.       Allergies  Allergen Reactions  . Adhesive [Tape] Rash    Bandaids cause rash     Current Outpatient Medications:  .  ALPRAZolam (XANAX) 0.25 MG tablet, Take 1 tablet (0.25 mg total) by mouth 2 (two) times daily as needed for anxiety., Disp: 180 tablet, Rfl: 1 .  Cetirizine HCl (ZYRTEC ALLERGY) 10 MG CAPS, Take 1 tablet by mouth daily., Disp: , Rfl:  .  escitalopram (LEXAPRO) 10 MG tablet, Take 1 tablet (10 mg total) by mouth at bedtime., Disp: 90 tablet, Rfl: 1 .  montelukast (SINGULAIR) 10 MG tablet, TAKE 1 TABLET BY MOUTH EVERYDAY AT BEDTIME, Disp: 90 tablet, Rfl: 1 .  valACYclovir (VALTREX) 1000 MG tablet, Take 2 tabs PO BID at onset cold sore, Disp: 12 tablet, Rfl: 1 .  sulfamethoxazole-trimethoprim (BACTRIM DS,SEPTRA DS) 800-160 MG tablet, Take 1 tablet by mouth 2 (two) times daily for 3 days., Disp: 6 tablet, Rfl: 0  Review of Systems  Genitourinary: Positive for hematuria and urgency.    Social History   Tobacco Use  . Smoking status: Never Smoker  . Smokeless tobacco: Never Used  Substance Use Topics  . Alcohol use: Yes    Alcohol/week: 1.0 standard drinks    Types: 1 Standard drinks or equivalent per week      Objective:   BP 118/80 (BP Location: Left Arm, Patient Position: Sitting, Cuff Size: Normal)   Pulse 93   Temp 98.2 F (36.8 C) (Oral)   Wt 194 lb  6.4 oz (88.2 kg)   SpO2 97%   BMI 32.35 kg/m  Vitals:   04/09/18 1632  BP: 118/80  Pulse: 93  Temp: 98.2 F (36.8 C)  TempSrc: Oral  SpO2: 97%  Weight: 194 lb 6.4 oz (88.2 kg)     Physical Exam Constitutional:      General: She is not in acute distress.    Appearance: She is well-developed. She is not diaphoretic.  Cardiovascular:     Rate and Rhythm: Normal rate and regular rhythm.  Pulmonary:     Effort: Pulmonary effort is normal.     Breath sounds: Normal breath sounds.  Abdominal:     General: Bowel sounds are normal. There is no distension.     Palpations: Abdomen is soft.     Tenderness: There is abdominal tenderness in the suprapubic area. There is no guarding or rebound.  Skin:    General: Skin is warm and dry.  Neurological:     Mental Status: She is alert and oriented to person, place, and time.  Psychiatric:        Behavior: Behavior normal.         Assessment & Plan    1. Possible urinary tract infection  Dipstick very positive with leukocytes, nitrites and blood. Will treat as below.  - POCT urinalysis  dipstick - Urine Culture - sulfamethoxazole-trimethoprim (BACTRIM DS,SEPTRA DS) 800-160 MG tablet; Take 1 tablet by mouth 2 (two) times daily for 3 days.  Dispense: 6 tablet; Refill: 0  The entirety of the information documented in the History of Present Illness, Review of Systems and Physical Exam were personally obtained by me. Portions of this information were initially documented by Lyndel Pleasure, CMA and reviewed by me for thoroughness and accuracy.   Return if symptoms worsen or fail to improve.         Trinna Post, PA-C  [facility]

## 2018-04-09 NOTE — Telephone Encounter (Signed)
Patient calling that she has a UTI and that she is scare because she saw some blood in her urine. Offered patient an appointment for this afternoon per patient not able to come in because she needs to be at school and has a lot of things to do and doesn't think she would be able to make tomorrow and was thinking if we can send an antibiotic for her. Advised patient that we need to see her and offer an appointment for tomorrow at first she states unable to come in because she needed to be in and out but offered her the 7 am with Tawanna Sat and patient agreed.

## 2018-04-10 ENCOUNTER — Ambulatory Visit: Payer: Self-pay | Admitting: Physician Assistant

## 2018-04-11 ENCOUNTER — Telehealth: Payer: Self-pay

## 2018-04-11 LAB — URINE CULTURE

## 2018-04-11 NOTE — Telephone Encounter (Signed)
-----   Message from Trinna Post, [place] sent at 04/11/2018  1:37 PM EST ----- Urine culture came back positive for e coli that is sensitive to bactrim. Please complete treatment.

## 2018-04-11 NOTE — Telephone Encounter (Signed)
Patient advised as directed below.  Thanks,  -Joseline 

## 2018-04-30 ENCOUNTER — Encounter: Payer: Self-pay | Admitting: *Deleted

## 2018-09-06 ENCOUNTER — Other Ambulatory Visit: Payer: Self-pay | Admitting: Physician Assistant

## 2018-09-06 DIAGNOSIS — F32A Depression, unspecified: Secondary | ICD-10-CM

## 2018-09-06 DIAGNOSIS — F329 Major depressive disorder, single episode, unspecified: Secondary | ICD-10-CM

## 2018-09-06 NOTE — Telephone Encounter (Signed)
Please review

## 2018-09-18 ENCOUNTER — Other Ambulatory Visit: Payer: Self-pay

## 2018-09-18 DIAGNOSIS — Z8619 Personal history of other infectious and parasitic diseases: Secondary | ICD-10-CM

## 2018-09-18 MED ORDER — VALACYCLOVIR HCL 1 G PO TABS: ORAL_TABLET | ORAL | 1 refills | Status: DC

## 2018-10-18 ENCOUNTER — Other Ambulatory Visit: Payer: Self-pay | Admitting: Physician Assistant

## 2018-10-18 DIAGNOSIS — J301 Allergic rhinitis due to pollen: Secondary | ICD-10-CM

## 2019-03-04 ENCOUNTER — Other Ambulatory Visit: Payer: Self-pay | Admitting: Physician Assistant

## 2019-03-04 DIAGNOSIS — F419 Anxiety disorder, unspecified: Secondary | ICD-10-CM

## 2019-03-04 DIAGNOSIS — F329 Major depressive disorder, single episode, unspecified: Secondary | ICD-10-CM

## 2019-03-10 ENCOUNTER — Encounter: Payer: Self-pay | Admitting: Physician Assistant

## 2019-03-14 ENCOUNTER — Other Ambulatory Visit: Payer: Self-pay

## 2019-03-17 ENCOUNTER — Ambulatory Visit (INDEPENDENT_AMBULATORY_CARE_PROVIDER_SITE_OTHER): Payer: BC Managed Care – PPO | Admitting: Physician Assistant

## 2019-03-17 ENCOUNTER — Other Ambulatory Visit (HOSPITAL_COMMUNITY)
Admission: RE | Admit: 2019-03-17 | Discharge: 2019-03-17 | Disposition: A | Discharge status: Home / Self Care | Payer: BC Managed Care – PPO | Source: Ambulatory Visit | Attending: Physician Assistant | Admitting: Physician Assistant

## 2019-03-17 ENCOUNTER — Other Ambulatory Visit: Payer: Self-pay

## 2019-03-17 ENCOUNTER — Encounter: Payer: Self-pay | Admitting: Physician Assistant

## 2019-03-17 VITALS — BP 122/82 | HR 76 | Temp 97.3°F | Resp 18 | Ht 65.5 in | Wt 193.8 lb

## 2019-03-17 DIAGNOSIS — F32A Depression, unspecified: Secondary | ICD-10-CM

## 2019-03-17 DIAGNOSIS — Z1239 Encounter for other screening for malignant neoplasm of breast: Secondary | ICD-10-CM | POA: Diagnosis not present

## 2019-03-17 DIAGNOSIS — R87629 Unspecified abnormal cytological findings in specimens from vagina: Secondary | ICD-10-CM | POA: Insufficient documentation

## 2019-03-17 DIAGNOSIS — F419 Anxiety disorder, unspecified: Secondary | ICD-10-CM | POA: Diagnosis not present

## 2019-03-17 DIAGNOSIS — Z0000 Encounter for general adult medical examination without abnormal findings: Secondary | ICD-10-CM | POA: Diagnosis not present

## 2019-03-17 DIAGNOSIS — Z1272 Encounter for screening for malignant neoplasm of vagina: Secondary | ICD-10-CM | POA: Diagnosis present

## 2019-03-17 DIAGNOSIS — F339 Major depressive disorder, recurrent, unspecified: Secondary | ICD-10-CM | POA: Diagnosis not present

## 2019-03-17 DIAGNOSIS — G479 Sleep disorder, unspecified: Secondary | ICD-10-CM

## 2019-03-17 DIAGNOSIS — F329 Major depressive disorder, single episode, unspecified: Secondary | ICD-10-CM

## 2019-03-17 DIAGNOSIS — E6609 Other obesity due to excess calories: Secondary | ICD-10-CM

## 2019-03-17 DIAGNOSIS — Z87898 Personal history of other specified conditions: Secondary | ICD-10-CM | POA: Insufficient documentation

## 2019-03-17 DIAGNOSIS — E7800 Pure hypercholesterolemia, unspecified: Secondary | ICD-10-CM | POA: Insufficient documentation

## 2019-03-17 DIAGNOSIS — Z6831 Body mass index (BMI) 31.0-31.9, adult: Secondary | ICD-10-CM

## 2019-03-17 MED ORDER — ESCITALOPRAM OXALATE 20 MG PO TABS: 20.0000 mg | ORAL_TABLET | Freq: Every day | ORAL | 1 refills | Status: DC

## 2019-03-17 MED ORDER — ALPRAZOLAM 0.25 MG PO TABS: 0.2500 mg | ORAL_TABLET | Freq: Two times a day (BID) | ORAL | 1 refills | Status: DC | PRN

## 2019-03-17 NOTE — Progress Notes (Signed)
Patient: Jody Nolan, Female    DOB: 1965/08/08, 53 y.o.   MRN: [id number] Visit Date: 03/17/2019  Today's Provider: Mar Daring, PA-C   Chief Complaint  Patient presents with  . Annual Exam   Subjective:     Annual physical exam Jody Nolan is a 53 y.o. female who presents today for health maintenance and complete physical. She feels fairly well. She reports exercising less the past 2 weeks and has gained 8-9 lbs since. She reports she is sleeping poorly. She has trouble falling/staying asleep. Has not tried anything for sleep OTC.   ----------------------------------------------------------------- Last Pap: 03/01/2016 Last Mammogram: 11/15/2017 -  Bi-rad 3 Last Colonoscopy : 03/29/2018 -  4 mm Polyp was removed from sigmoid Repeat in 5 years.   Review of Systems  Constitutional: Positive for fatigue. Negative for activity change, appetite change, chills, diaphoresis, fever and unexpected weight change.       Crying   HENT: Negative.   Eyes: Negative.   Respiratory: Negative.   Cardiovascular: Negative.   Gastrointestinal: Negative.   Endocrine: Negative.   Genitourinary: Negative.   Musculoskeletal: Negative.   Skin: Negative.   Allergic/Immunologic: Negative.   Neurological: Negative.   Hematological: Negative.   Psychiatric/Behavioral: Positive for agitation and sleep disturbance. The patient is nervous/anxious.     Social History      She  reports that she has never smoked. She has never used smokeless tobacco. She reports current alcohol use of about 1.0 standard drinks of alcohol per week. She reports that she does not use drugs.       Social History   Socioeconomic History  . Marital status: Married    Spouse name: Not on file  . Number of children: 2  . Years of education: [employer]  . Highest education level: Not on file  Occupational History  . 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 Financial resource strain: Not on file  . Food insecurity    Worry: Not on file    Inability: Not on file  . Transportation needs    Medical: Not on file    Non-medical: Not on file  Tobacco Use  . Smoking status: Never Smoker  . Smokeless tobacco: Never Used  Substance and Sexual Activity  . Alcohol use: Yes    Alcohol/week: 1.0 standard drinks    Types: 1 Standard drinks or equivalent per week  . Drug use: No  . Sexual activity: Yes  Lifestyle  . Physical activity    Days per week: Not on file    Minutes per session: Not on file  . Stress: Not on file  Relationships  . Social Herbalist on phone: Not on file    Gets together: Not on file    Attends religious service: Not on file    Active member of club or organization: Not on file    Attends meetings of clubs or organizations: Not on file    Relationship status: Not on file  Other Topics Concern  . Not on file  Social History Narrative  . Not on file    Past Medical History:  Diagnosis Date  . Allergic rhinitis   . Degeneration of cervical intervertebral disc   . DUB (dysfunctional uterine bleeding)   . Family history of adverse reaction to anesthesia    Mothr - PONV  .  Family history of colon cancer   . History of abnormal cervical Pap smear   . History of chicken pox   . Radiculopathy   . Reaction, stress, acute   . Shoulder pain      Patient Active Problem List   Diagnosis Date Noted  . Hypercholesterolemia 03/17/2019  . H/O abnormal mammogram 03/17/2019  . Encounter for screening colonoscopy   . Polyp of sigmoid colon   . Class 1 obesity due to excess calories without serious comorbidity with body mass index (BMI) of 31.0 to 31.9 in adult 03/06/2018  . Allergic rhinitis 01/17/2016  . Degeneration of cervical intervertebral disc 01/17/2016  . Dysfunctional uterine bleeding 01/17/2016  . Family history of GI tract cancer 01/17/2016  . Vaginal Pap smear,  abnormal 01/17/2016  . Radiculopathy 01/17/2016  . Family history of colon cancer 07/19/2009  . Uncomplicated herpes simplex 07/19/2009    Past Surgical History:  Procedure Laterality Date  . ABDOMINAL HYSTERECTOMY     partial  . CESAREAN SECTION  2000  . COLONOSCOPY WITH PROPOFOL N/A 03/29/2018   Procedure: COLONOSCOPY WITH BIOPSY;  Surgeon: [name], MD;  Location: [place];  Service: Endoscopy;  Laterality: N/A;  . LEEP  1990's  . POLYPECTOMY N/A 03/29/2018   Procedure: POLYPECTOMY;  Surgeon: [name], MD;  Location: [place];  Service: Endoscopy;  Laterality: N/A;    Family History        Family Status  Relation Name Status  . Mother  Alive  . Father  Deceased at age 51       colon cancer  . Brother  Alive  . Daughter  Alive  . Son  Alive  . MGM  Alive, age 78y  . PGM  Alive, age 40y        Her family history includes Cancer in her father; Hypertension in her brother and paternal grandmother; Stroke in her paternal grandmother.      Allergies  Allergen Reactions  . Adhesive [Tape] Rash    Bandaids cause rash     Current Outpatient Medications:  .  ALPRAZolam (XANAX) 0.25 MG tablet, Take 1 tablet (0.25 mg total) by mouth 2 (two) times daily as needed for anxiety., Disp: 180 tablet, Rfl: 1 .  Cetirizine HCl (ZYRTEC ALLERGY) 10 MG CAPS, Take 1 tablet by mouth daily., Disp: , Rfl:  .  meloxicam (MOBIC) 15 MG tablet, TAKE 1 TABLET BY MOUTH SINGLE DOSE WITH MEALS FOR 2 WEEKS AND THEN AS NEEDED, Disp: , Rfl:  .  montelukast (SINGULAIR) 10 MG tablet, Take 10 mg by mouth at bedtime., Disp: , Rfl:  .  valACYclovir (VALTREX) 1000 MG tablet, Take 2 tabs PO BID at onset cold sore, Disp: 12 tablet, Rfl: 1   Patient Care Team: Mar Daring, PA-C as PCP - General (Family Medicine)    Objective:    Vitals: BP 122/82   Pulse 76   Temp (!) 97.3 F (36.3 C) (Temporal)   Resp 18   Ht 5' 5.5" (1.664 m)   Wt 193 lb 12.8 oz (87.9 kg)   BMI  31.76 kg/m    Vitals:   03/17/19 1505  BP: 122/82  Pulse: 76  Resp: 18  Temp: (!) 97.3 F (36.3 C)  TempSrc: Temporal  Weight: 193 lb 12.8 oz (87.9 kg)  Height: 5' 5.5" (1.664 m)     Physical Exam Vitals signs reviewed.  Constitutional:      General: She is not in acute distress.  Appearance: Normal appearance. She is well-developed. She is obese. She is not ill-appearing or diaphoretic.  HENT:     Head: Normocephalic and atraumatic.     Right Ear: Hearing, tympanic membrane, ear canal and external ear normal.     Left Ear: Hearing, tympanic membrane, ear canal and external ear normal.     Nose: Nose normal.     Mouth/Throat:     Mouth: Mucous membranes are moist.     Pharynx: Uvula midline. No oropharyngeal exudate or posterior oropharyngeal erythema.  Eyes:     General: No scleral icterus.       Right eye: No discharge.        Left eye: No discharge.     Extraocular Movements: Extraocular movements intact.     Conjunctiva/sclera: Conjunctivae normal.     Pupils: Pupils are equal, round, and reactive to light.  Neck:     Musculoskeletal: Normal range of motion and neck supple.     Thyroid: No thyromegaly.     Vascular: No carotid bruit or JVD.     Trachea: No tracheal deviation.  Cardiovascular:     Rate and Rhythm: Normal rate and regular rhythm.     Pulses: Normal pulses.     Heart sounds: Normal heart sounds. No murmur. No friction rub. No gallop.   Pulmonary:     Effort: Pulmonary effort is normal. No respiratory distress.     Breath sounds: Normal breath sounds. No wheezing or rales.  Chest:     Chest wall: No tenderness.     Breasts: Breasts are symmetrical.        Right: No inverted nipple, mass, nipple discharge, skin change or tenderness.        Left: No inverted nipple, mass, nipple discharge, skin change or tenderness.  Abdominal:     General: Abdomen is flat. Bowel sounds are normal. There is no distension.     Palpations: Abdomen is soft. There is  no mass.     Tenderness: There is no abdominal tenderness. There is no guarding or rebound.     Hernia: There is no hernia in the left inguinal area or right inguinal area.  Genitourinary:    General: Normal vulva.     Exam position: Supine.     Pubic Area: No rash.      Labia:        Right: No rash, tenderness, lesion or injury.        Left: No rash, tenderness, lesion or injury.      Vagina: Normal. No signs of injury. No vaginal discharge, erythema, tenderness or bleeding.     Uterus: Absent.      Adnexa:        Right: No mass, tenderness or fullness.         Left: No mass, tenderness or fullness.       Rectum: Normal.     Comments: Uterus and cervix surgically absent Musculoskeletal: Normal range of motion.        General: No tenderness.     Right lower leg: No edema.     Left lower leg: No edema.  Lymphadenopathy:     Cervical: No cervical adenopathy.     Lower Body: No right inguinal adenopathy. No left inguinal adenopathy.  Skin:    General: Skin is warm and dry.     Capillary Refill: Capillary refill takes less than 2 seconds.     Findings: No rash.  Neurological:     General:  No focal deficit present.     Mental Status: She is alert and oriented to person, place, and time. Mental status is at baseline.     Cranial Nerves: No cranial nerve deficit.     Coordination: Coordination normal.     Deep Tendon Reflexes: Reflexes are normal and symmetric.  Psychiatric:        Mood and Affect: Mood normal.        Behavior: Behavior normal.        Thought Content: Thought content normal.        Judgment: Judgment normal.      Depression Screen PHQ 2/9 Scores 03/17/2019 03/06/2018 01/21/2018 03/02/2017  PHQ - 2 Score 1 3 1  0  PHQ- 9 Score 6 13 12 2        Assessment & Plan:     Routine Health Maintenance and Physical Exam  Exercise Activities and Dietary recommendations Goals   None     Immunization History  Administered Date(s) Administered  . Td 06/28/2010   . Tdap 06/28/2010    Health Maintenance  Topic Date Due  . PAP SMEAR-Modifier  03/02/2019  . INFLUENZA VACCINE  07/23/2019 (Originally 11/23/2018)  . MAMMOGRAM  05/12/2019  . TETANUS/TDAP  06/27/2020  . COLONOSCOPY  03/30/2023  . HIV Screening  Completed     Discussed health benefits of physical activity, and encouraged her to engage in regular exercise appropriate for her age and condition.    1. Annual physical exam Normal physical exam today. Will check labs as below and f/u pending lab results. If labs are stable and WNL she will not need to have these rechecked for one year at her next annual physical exam. She is to call the office in the meantime if she has any acute issue, questions or concerns. - CBC w/Diff/Platelet - Comprehensive Metabolic Panel (CMET) - TSH - Lipid Profile - HgB A1c  2. H/O abnormal mammogram H/O abnormal mammogram in January 2019. Had 6 month repeat US in July 2019. Was to f/u with repeat diagnostic mammogram and [place] in 6 months (January 2020) but this was not done. Ordered as below. Patient has felt no changes with self breast exams. There is no family history of breast cancer  - MM DIAG BREAST TOMO BILATERAL; Future - US BREAST LTD UNI RIGHT INC AXILLA; Future  3. Encounter for breast cancer screening using non-mammogram modality See above medical treatment plan. - MM DIAG BREAST TOMO BILATERAL; Future - US BREAST LTD UNI RIGHT INC AXILLA; Future  4. Vaginal Pap smear H/O abnormal pap smear dating back to the 1990s. Now s/p hysterectomy due to dysfunctional uterine bleeding.  Pap collected as below to screen for vaginal changes since she has long standing history of abnormal testing.  - Cytology - PAP  5. Vaginal Pap smear, abnormal See above medical treatment plan.  6. Hypercholesterolemia H/O this. Diet controlled. Will check labs as below and f/u pending results. - Comprehensive Metabolic Panel (CMET) - Lipid Profile  7. Class 1 obesity  due to excess calories with serious comorbidity and body mass index (BMI) of 31.0 to 31.9 in adult Counseled patient on healthy lifestyle modifications including dieting and exercise.   8. Depression, recurrent ([place]) Worsening most likely due to stress and anxiety from covid-19. Increase lexapro to 20mg  as below. Continue alprazolam 0.25mg  BID prn. Call if continues to worsen or not improve. - escitalopram (LEXAPRO) 20 MG tablet; Take 1 tablet (20 mg total) by mouth daily.  Dispense: 90 tablet;  Refill: 1  9. Anxiety and depression Stable. Diagnosis pulled for medication refill. Continue current medical treatment plan. - ALPRAZolam (XANAX) 0.25 MG tablet; Take 1 tablet (0.25 mg total) by mouth 2 (two) times daily as needed for anxiety.  Dispense: 180 tablet; Refill: 1  10. Difficulty sleeping Advised to try OTC unisom or to try melatonin 3-5 mg at bedtime. If not working please call the office to discuss other options.   --------------------------------------------------------------------    Mar Daring, PA-C  [place]

## 2019-03-17 NOTE — Patient Instructions (Signed)

## 2019-03-19 ENCOUNTER — Telehealth: Payer: Self-pay

## 2019-03-19 LAB — CBC WITH DIFFERENTIAL/PLATELET
Basophils Absolute: 0.1 10*3/uL (ref 0.0–0.2)
Basos: 1 %
EOS (ABSOLUTE): 0.4 10*3/uL (ref 0.0–0.4)
Eos: 5 %
Hematocrit: 42.9 % (ref 34.0–46.6)
Hemoglobin: 14 g/dL (ref 11.1–15.9)
Immature Grans (Abs): 0 10*3/uL (ref 0.0–0.1)
Immature Granulocytes: 0 %
Lymphocytes Absolute: 1.9 10*3/uL (ref 0.7–3.1)
Lymphs: 25 %
MCH: 28.9 pg (ref 26.6–33.0)
MCHC: 32.6 g/dL (ref 31.5–35.7)
MCV: 89 fL (ref 79–97)
Monocytes Absolute: 0.6 10*3/uL (ref 0.1–0.9)
Monocytes: 8 %
Neutrophils Absolute: 4.5 10*3/uL (ref 1.4–7.0)
Neutrophils: 61 %
Platelets: 368 10*3/uL (ref 150–450)
RBC: 4.84 x10E6/uL (ref 3.77–5.28)
RDW: 12.3 % (ref 11.7–15.4)
WBC: 7.5 10*3/uL (ref 3.4–10.8)

## 2019-03-19 LAB — LIPID PANEL
Chol/HDL Ratio: 3.3 ratio (ref 0.0–4.4)
Cholesterol, Total: 248 mg/dL — ABNORMAL HIGH (ref 100–199)
HDL: 76 mg/dL (ref >39)
LDL Chol Calc (NIH): 152 mg/dL — ABNORMAL HIGH (ref 0–99)
Triglycerides: 113 mg/dL (ref 0–149)
VLDL Cholesterol Cal: 20 mg/dL (ref 5–40)

## 2019-03-19 LAB — COMPREHENSIVE METABOLIC PANEL
ALT: 19 IU/L (ref 0–32)
AST: 17 IU/L (ref 0–40)
Albumin/Globulin Ratio: 2.4 — ABNORMAL HIGH (ref 1.2–2.2)
Albumin: 4.5 g/dL (ref 3.8–4.9)
Alkaline Phosphatase: 71 IU/L (ref 39–117)
BUN/Creatinine Ratio: 19 (ref 9–23)
BUN: 14 mg/dL (ref 6–24)
Bilirubin Total: 0.6 mg/dL (ref 0.0–1.2)
CO2: 23 mmol/L (ref 20–29)
Calcium: 9.3 mg/dL (ref 8.7–10.2)
Chloride: 102 mmol/L (ref 96–106)
Creatinine, Ser: 0.73 mg/dL (ref 0.57–1.00)
GFR calc Af Amer: 110 mL/min/{1.73_m2} (ref >59)
GFR calc non Af Amer: 96 mL/min/{1.73_m2} (ref >59)
Globulin, Total: 1.9 g/dL (ref 1.5–4.5)
Glucose: 92 mg/dL (ref 65–99)
Potassium: 4.5 mmol/L (ref 3.5–5.2)
Sodium: 139 mmol/L (ref 134–144)
Total Protein: 6.4 g/dL (ref 6.0–8.5)

## 2019-03-19 LAB — HEMOGLOBIN A1C
Est. average glucose Bld gHb Est-mCnc: 111 mg/dL
Hgb A1c MFr Bld: 5.5 % (ref 4.8–5.6)

## 2019-03-19 LAB — TSH: TSH: 3.81 u[IU]/mL (ref 0.450–4.500)

## 2019-03-19 NOTE — Telephone Encounter (Signed)
-----   Message from Mar Daring, [place] sent at 03/19/2019  9:57 AM EST ----- Blood count is normal. Kidney and liver function are normal. Sodium, potassium, and calcium are normal. Thyroid is normal. Cholesterol is elevated and has increased compared to last time. Currently your risk of having a cardiovascular event over the next 10 years is low at 1.1%. No need for cholesterol lowering medication at this time. Working on healthy lifestyle modifications such as limiting fatty foods, processed foods, red meats and sugars from diet as well as increasing physical activity to get in 150 min of moderate activity per week. A1c/sugar is normal.

## 2019-03-19 NOTE — Telephone Encounter (Signed)
Viewed by Aram Candela on 03/19/2019 10:09 AM Written by Mar Daring, PA-C on 03/19/2019 9:57 AM Blood count is normal. Kidney and liver function are normal. Sodium, potassium, and calcium are normal. Thyroid is normal. Cholesterol is elevated and has increased compared to last time. Currently your risk of having a cardiovascular event over the next 10 years is low at 1.1%. No need for cholesterol lowering medication at this time. Working on healthy lifestyle modifications such as limiting fatty foods, processed foods, red meats and sugars from diet as well as increasing physical activity to get in 150 min of moderate activity per week. A1c/sugar is normal.

## 2019-03-24 ENCOUNTER — Telehealth: Payer: Self-pay

## 2019-03-24 DIAGNOSIS — R87612 Low grade squamous intraepithelial lesion on cytologic smear of cervix (LGSIL): Secondary | ICD-10-CM

## 2019-03-24 LAB — CYTOLOGY - PAP
Comment: NEGATIVE
High risk HPV: NEGATIVE

## 2019-03-24 NOTE — Telephone Encounter (Signed)
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 It is recommended for this to be followed up with a GYN referral and colposcopy. If agreeable I can place this referral for you.

## 2019-03-24 NOTE — Telephone Encounter (Signed)
Referral placed.

## 2019-03-24 NOTE — Telephone Encounter (Signed)
Patient advised as directed below. No preference on which GYN.

## 2019-04-09 ENCOUNTER — Other Ambulatory Visit: Payer: Self-pay | Admitting: Physician Assistant

## 2019-04-09 DIAGNOSIS — J301 Allergic rhinitis due to pollen: Secondary | ICD-10-CM

## 2019-04-09 NOTE — Telephone Encounter (Signed)
Requested medication (s) are due for refill today: yes  Requested medication (s) are on the active medication list: yes  Last refill:  01/17/2019  Future visit scheduled: yes  Notes to clinic:  historical provider    Requested Prescriptions  Pending Prescriptions Disp Refills   montelukast (SINGULAIR) 10 MG tablet [Pharmacy Med Name: MONTELUKAST SOD 10 MG TABLET] 90 tablet 1    Sig: TAKE 1 TABLET BY MOUTH EVERYDAY AT BEDTIME      Pulmonology:  Leukotriene Inhibitors Failed - 04/09/2019  1:40 AM      Failed - Valid encounter within last 12 months    Recent Outpatient Visits           3 weeks ago Annual physical exam   [facility] [place], Clearnce Sorrel, [place]   1 year ago Possible urinary tract infection   [facility] [place], Wendee Beavers, [place]   1 year ago Annual physical exam   [place], Clearnce Sorrel, [place]   1 year ago [place], Wendee Beavers, [place]   1 year ago Anxiety and depression   [facility] [place], [place], [place]

## 2019-04-18 ENCOUNTER — Other Ambulatory Visit: Payer: Self-pay | Admitting: Physician Assistant

## 2019-04-18 DIAGNOSIS — Z8619 Personal history of other infectious and parasitic diseases: Secondary | ICD-10-CM

## 2019-04-21 ENCOUNTER — Other Ambulatory Visit: Payer: BC Managed Care – PPO

## 2019-04-21 NOTE — Telephone Encounter (Signed)
Requested medication (s) are due for refill today: yes  Requested medication (s) are on the active medication list: yes  Last refill:  11/18/2018  Future visit scheduled: yes  Notes to clinic:  no valid encounter in last 12 months    Requested Prescriptions  Pending Prescriptions Disp Refills   valACYclovir (VALTREX) 1000 MG tablet [Pharmacy Med Name: VALACYCLOVIR HCL 1 GRAM TABLET] 12 tablet 1    Sig: TAKE 2 TABLETS BY MOUTH TWICE A DAY AT ONSET OF COLD SORE      Antimicrobials:  Antiviral Agents - Anti-Herpetic Failed - 04/18/2019 11:34 AM      Failed - Valid encounter within last 12 months    Recent Outpatient Visits           1 month ago Annual physical exam   [place], Clearnce Sorrel, [place]   1 year ago Possible urinary tract infection   [facility] [place], Wendee Beavers, [place]   1 year ago Annual physical exam   [place], Clearnce Sorrel, [place]   1 year ago [place], Wendee Beavers, [place]   1 year ago Anxiety and depression   [facility] [place], [place], [place]

## 2019-04-28 ENCOUNTER — Ambulatory Visit
Admission: RE | Admit: 2019-04-28 | Discharge: 2019-04-28 | Disposition: A | Discharge status: Home / Self Care | Payer: BC Managed Care – PPO | Source: Ambulatory Visit | Attending: Physician Assistant | Admitting: Physician Assistant

## 2019-04-28 DIAGNOSIS — Z87898 Personal history of other specified conditions: Secondary | ICD-10-CM

## 2019-04-28 DIAGNOSIS — Z1239 Encounter for other screening for malignant neoplasm of breast: Secondary | ICD-10-CM

## 2019-04-29 ENCOUNTER — Other Ambulatory Visit (HOSPITAL_COMMUNITY)
Admission: RE | Admit: 2019-04-29 | Discharge: 2019-04-29 | Disposition: A | Discharge status: Home / Self Care | Payer: BC Managed Care – PPO | Source: Ambulatory Visit | Attending: Obstetrics and Gynecology | Admitting: Obstetrics and Gynecology

## 2019-04-29 ENCOUNTER — Ambulatory Visit (INDEPENDENT_AMBULATORY_CARE_PROVIDER_SITE_OTHER): Payer: BC Managed Care – PPO | Admitting: Obstetrics and Gynecology

## 2019-04-29 ENCOUNTER — Other Ambulatory Visit: Payer: Self-pay

## 2019-04-29 ENCOUNTER — Encounter: Payer: Self-pay | Admitting: Obstetrics and Gynecology

## 2019-04-29 VITALS — BP 120/76 | HR 112 | Ht 65.5 in | Wt 203.0 lb

## 2019-04-29 DIAGNOSIS — R87622 Low grade squamous intraepithelial lesion on cytologic smear of vagina (LGSIL): Secondary | ICD-10-CM | POA: Diagnosis present

## 2019-04-29 NOTE — Progress Notes (Signed)
   GYNECOLOGY CLINIC COLPOSCOPY PROCEDURE NOTE  54 y.o. No obstetric history on file. here for colposcopy for low-grade squamous intraepithelial neoplasia (LGSIL - encompassing HPV,mild dysplasia,CIN I)  pap smear on 03/17/2019. Discussed underlying role for HPV infection in the development of cervical dysplasia, its natural history and progression/regression, need for surveillance.  She has a history significant of a hysterectomy for prevention of cervical cancer in 2014-2015.  She has several LEEPs prior to the hysterectomy.  She still has both ovaries.   Is the patient  pregnant: No LMP: No LMP recorded. Patient has had a hysterectomy. Smoking status:  reports that she has never smoked. She has never used smokeless tobacco. Contraception: none Future fertility desired:  No  Patient given informed consent, signed copy in the chart, time out was performed.  The patient was position in dorsal lithotomy position. Speculum was placed the cervix was visualized.   After application of acetic acid colposcopic inspection of the cervix was undertaken.   Colposcopy adequate,Yes No visible lesions; corresponding biopsies obtained.  One vaginal biopsy performed of the cuff. ECC specimen obtained:  No  All specimens were labeled and sent to pathology.   Patient was given post procedure instructions.  Will follow up pathology and manage accordingly.  Routine preventative health maintenance measures emphasized.  Adrian Prows MD [facility], [facility] 04/29/2019 4:20 PM

## 2019-05-01 LAB — SURGICAL PATHOLOGY

## 2019-06-19 ENCOUNTER — Ambulatory Visit: Payer: Self-pay

## 2019-06-19 ENCOUNTER — Ambulatory Visit: Payer: BC Managed Care – PPO | Attending: Internal Medicine

## 2019-06-19 DIAGNOSIS — Z23 Encounter for immunization: Secondary | ICD-10-CM | POA: Insufficient documentation

## 2019-06-19 NOTE — Progress Notes (Signed)
   Covid-19 Vaccination Clinic  Name:  Oreana Koester    MRN: [id number] DOB: 1965/06/18  06/19/2019  Ms. Eichorn was observed post Covid-19 immunization for 15 minutes without incidence. She was provided with Vaccine Information Sheet and instruction to access the V-Safe system.   Ms. Dose was instructed to call 911 with any severe reactions post vaccine: Marland Kitchen Difficulty breathing  . Swelling of your face and throat  . A fast heartbeat  . A bad rash all over your body  . Dizziness and weakness    Immunizations Administered    Name Date Dose VIS Date Route   Pfizer COVID-19 Vaccine 06/19/2019  8:27 AM 0.3 mL 04/04/2019 Intramuscular   Manufacturer: [place]   Lot: [id number]   [place]: [id number]

## 2019-07-15 ENCOUNTER — Ambulatory Visit: Payer: BC Managed Care – PPO | Attending: Internal Medicine

## 2019-07-15 DIAGNOSIS — Z23 Encounter for immunization: Secondary | ICD-10-CM

## 2019-07-15 NOTE — Progress Notes (Signed)
   Covid-19 Vaccination Clinic  Name:  Jody Nolan    MRN: [id number] DOB: [birth year]  07/15/2019  [name] was observed post Covid-19 immunization for 15 minutes without incident. She was provided with Vaccine Information Sheet and instruction to access the V-Safe system.   [name] was instructed to call 911 with any severe reactions post vaccine: Marland Kitchen Difficulty breathing  . Swelling of face and throat  . A fast heartbeat  . A bad rash all over body  . Dizziness and weakness   Immunizations Administered    Name Date Dose VIS Date Route   Pfizer COVID-19 Vaccine 07/15/2019  8:31 AM 0.3 mL 04/04/2019 Intramuscular   Manufacturer: [place]   Lot: [id number]   [place]: [id number]

## 2019-08-14 ENCOUNTER — Ambulatory Visit (INDEPENDENT_AMBULATORY_CARE_PROVIDER_SITE_OTHER): Payer: BC Managed Care – PPO | Admitting: Physician Assistant

## 2019-08-14 ENCOUNTER — Encounter: Payer: Self-pay | Admitting: Physician Assistant

## 2019-08-14 DIAGNOSIS — B379 Candidiasis, unspecified: Secondary | ICD-10-CM

## 2019-08-14 DIAGNOSIS — J40 Bronchitis, not specified as acute or chronic: Secondary | ICD-10-CM | POA: Diagnosis not present

## 2019-08-14 DIAGNOSIS — T3695XA Adverse effect of unspecified systemic antibiotic, initial encounter: Secondary | ICD-10-CM

## 2019-08-14 DIAGNOSIS — Z8619 Personal history of other infectious and parasitic diseases: Secondary | ICD-10-CM

## 2019-08-14 DIAGNOSIS — J0140 Acute pansinusitis, unspecified: Secondary | ICD-10-CM

## 2019-08-14 MED ORDER — PREDNISONE 10 MG (21) PO TBPK: ORAL_TABLET | ORAL | 0 refills | Status: DC

## 2019-08-14 MED ORDER — FLUCONAZOLE 150 MG PO TABS: 150.0000 mg | ORAL_TABLET | Freq: Once | ORAL | 0 refills | Status: AC

## 2019-08-14 MED ORDER — VALACYCLOVIR HCL 1 G PO TABS: ORAL_TABLET | ORAL | 1 refills | Status: DC

## 2019-08-14 MED ORDER — AMOXICILLIN-POT CLAVULANATE 875-125 MG PO TABS: 1.0000 | ORAL_TABLET | Freq: Two times a day (BID) | ORAL | 0 refills | Status: DC

## 2019-08-14 NOTE — Patient Instructions (Signed)
Sinusitis, Adult Sinusitis is inflammation of your sinuses. Sinuses are hollow spaces in the bones around your face. Your sinuses are located:  Around your eyes.  In the middle of your forehead.  Behind your nose.  In your cheekbones. Mucus normally drains out of your sinuses. When your nasal tissues become inflamed or swollen, mucus can become trapped or blocked. This allows bacteria, viruses, and fungi to grow, which leads to infection. Most infections of the sinuses are caused by a virus. Sinusitis can develop quickly. It can last for up to 4 weeks (acute) or for more than 12 weeks (chronic). Sinusitis often develops after a cold. What are the causes? This condition is caused by anything that creates swelling in the sinuses or stops mucus from draining. This includes:  Allergies.  Asthma.  Infection from bacteria or viruses.  Deformities or blockages in your nose or sinuses.  Abnormal growths in the nose (nasal polyps).  Pollutants, such as chemicals or irritants in the air.  Infection from fungi (rare). What increases the risk? You are more likely to develop this condition if you:  Have a weak body defense system (immune system).  Do a lot of swimming or diving.  Overuse nasal sprays.  Smoke. What are the signs or symptoms? The main symptoms of this condition are pain and a feeling of pressure around the affected sinuses. Other symptoms include:  Stuffy nose or congestion.  Thick drainage from your nose.  Swelling and warmth over the affected sinuses.  Headache.  Upper toothache.  A cough that may get worse at night.  Extra mucus that collects in the throat or the back of the nose (postnasal drip).  Decreased sense of smell and taste.  Fatigue.  A fever.  Sore throat.  Bad breath. How is this diagnosed? This condition is diagnosed based on:  Your symptoms.  Your medical history.  A physical exam.  Tests to find out if your condition is  acute or chronic. This may include: ? Checking your nose for nasal polyps. ? Viewing your sinuses using a device that has a light (endoscope). ? Testing for allergies or bacteria. ? Imaging tests, such as an MRI or CT scan. In rare cases, a bone biopsy may be done to rule out more serious types of fungal sinus disease. How is this treated? Treatment for sinusitis depends on the cause and whether your condition is chronic or acute.  If caused by a virus, your symptoms should go away on their own within 10 days. You may be given medicines to relieve symptoms. They include: ? Medicines that shrink swollen nasal passages (topical intranasal decongestants). ? Medicines that treat allergies (antihistamines). ? A spray that eases inflammation of the nostrils (topical intranasal corticosteroids). ? Rinses that help get rid of thick mucus in your nose (nasal saline washes).  If caused by bacteria, your health care provider may recommend waiting to see if your symptoms improve. Most bacterial infections will get better without antibiotic medicine. You may be given antibiotics if you have: ? A severe infection. ? A weak immune system.  If caused by narrow nasal passages or nasal polyps, you may need to have surgery. Follow these instructions at home: Medicines  Take, use, or apply over-the-counter and prescription medicines only as told by your health care provider. These may include nasal sprays.  If you were prescribed an antibiotic medicine, take it as told by your health care provider. Do not stop taking the antibiotic even if you start   to feel better. Hydrate and humidify   Drink enough fluid to keep your urine pale yellow. Staying hydrated will help to thin your mucus.  Use a cool mist humidifier to keep the humidity level in your home above 50%.  Inhale steam for 10-15 minutes, 3-4 times a day, or as told by your health care provider. You can do this in the bathroom while a hot shower is  running.  Limit your exposure to cool or dry air. Rest  Rest as much as possible.  Sleep with your head raised (elevated).  Make sure you get enough sleep each night. General instructions   Apply a warm, moist washcloth to your face 3-4 times a day or as told by your health care provider. This will help with discomfort.  Wash your hands often with soap and water to reduce your exposure to germs. If soap and water are not available, use hand sanitizer.  Do not smoke. Avoid being around people who are smoking (secondhand smoke).  Keep all follow-up visits as told by your health care provider. This is important. Contact a health care provider if:  You have a fever.  Your symptoms get worse.  Your symptoms do not improve within 10 days. Get help right away if:  You have a severe headache.  You have persistent vomiting.  You have severe pain or swelling around your face or eyes.  You have vision problems.  You develop confusion.  Your neck is stiff.  You have trouble breathing. Summary  Sinusitis is soreness and inflammation of your sinuses. Sinuses are hollow spaces in the bones around your face.  This condition is caused by nasal tissues that become inflamed or swollen. The swelling traps or blocks the flow of mucus. This allows bacteria, viruses, and fungi to grow, which leads to infection.  If you were prescribed an antibiotic medicine, take it as told by your health care provider. Do not stop taking the antibiotic even if you start to feel better.  Keep all follow-up visits as told by your health care provider. This is important. This information is not intended to replace advice given to you by your health care provider. Make sure you discuss any questions you have with your health care provider. Document Revised: 09/10/2017 Document Reviewed: 09/10/2017 Elsevier Patient Education  2020 Elsevier Inc.  

## 2019-08-14 NOTE — Progress Notes (Signed)
Virtual telephone visit    Virtual Visit via Telephone Note   This visit type was conducted due to national recommendations for restrictions regarding the COVID-19 Pandemic (e.g. social distancing) in an effort to limit this patient's exposure and mitigate transmission in our community. Due to her co-morbid illnesses, this patient is at least at moderate risk for complications without adequate follow up. This format is felt to be most appropriate for this patient at this time. The patient did not have access to video technology or had technical difficulties with video requiring transitioning to audio format only (telephone). Physical exam was limited to content and character of the telephone converstion.    Patient location: Home Provider location: [facility]   Patient: Jody Nolan   DOB: [birth year]   54 y.o. Female  MRN: [id number] Visit Date: 08/14/2019  Today's Provider: Mar Daring, PA-C  Subjective:    Chief Complaint  Patient presents with  . URI   URI  This is a new problem. The current episode started 1 to 4 weeks ago (3 weeks). There has been no fever. Associated symptoms include coughing, ear pain (right), headaches ("occasional"), sinus pain and wheezing. She has tried antihistamine and inhaler use (Mucinex) for the symptoms.  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   Patient Active Problem List   Diagnosis Date Noted  . Hypercholesterolemia 03/17/2019  . H/O abnormal mammogram 03/17/2019  . Encounter for screening colonoscopy   . Polyp of sigmoid colon   . Class 1 obesity due to excess calories without serious comorbidity with body mass index (BMI) of 31.0 to 31.9 in adult 03/06/2018  . Allergic rhinitis 01/17/2016  . Degeneration of cervical intervertebral disc 01/17/2016  . Dysfunctional uterine bleeding 01/17/2016  . Family history of GI tract cancer 01/17/2016  . Vaginal Pap smear, abnormal  01/17/2016  . Radiculopathy 01/17/2016  . Family history of colon cancer 07/19/2009  . Uncomplicated herpes simplex 07/19/2009   Past Medical History:  Diagnosis Date  . Allergic rhinitis   . Degeneration of cervical intervertebral disc   . DUB (dysfunctional uterine bleeding)   . Family history of adverse reaction to anesthesia    Mothr - PONV  . Family history of colon cancer   . History of abnormal cervical Pap smear   . History of chicken pox   . Radiculopathy   . Reaction, stress, acute   . Shoulder pain    Allergies  Allergen Reactions  . Adhesive [Tape] Rash    Bandaids cause rash    Medications: Outpatient Medications Prior to Visit  Medication Sig  . ALPRAZolam (XANAX) 0.25 MG tablet Take 1 tablet (0.25 mg total) by mouth 2 (two) times daily as needed for anxiety.  . Cetirizine HCl (ZYRTEC ALLERGY) 10 MG CAPS Take 1 tablet by mouth daily.  Marland Kitchen escitalopram (LEXAPRO) 20 MG tablet Take 1 tablet (20 mg total) by mouth daily.  . fluticasone (FLONASE) 50 MCG/ACT nasal spray Place 2 sprays into both nostrils daily.  . meloxicam (MOBIC) 15 MG tablet TAKE 1 TABLET BY MOUTH SINGLE DOSE WITH MEALS FOR 2 WEEKS AND THEN AS NEEDED  . montelukast (SINGULAIR) 10 MG tablet TAKE 1 TABLET BY MOUTH EVERYDAY AT BEDTIME  . valACYclovir (VALTREX) 1000 MG tablet TAKE 2 TABLETS BY MOUTH TWICE A DAY AT ONSET OF COLD SORE   No facility-administered medications prior to visit.    Review of Systems  HENT:  Positive for ear pain (right), postnasal drip, sinus pressure and sinus pain.   Respiratory: Positive for cough, chest tightness and wheezing. Negative for shortness of breath.   Neurological: Positive for headaches ("occasional").    Last CBC Lab Results  Component Value Date   WBC 7.5 03/18/2019   HGB 14.0 03/18/2019   HCT 42.9 03/18/2019   MCV 89 03/18/2019   MCH 28.9 03/18/2019   RDW 12.3 03/18/2019   PLT 368 [id number]   Last metabolic panel Lab Results  Component Value  Date   GLUCOSE 92 03/18/2019   NA 139 03/18/2019   K 4.5 03/18/2019   CL 102 03/18/2019   CO2 23 03/18/2019   BUN 14 03/18/2019   CREATININE 0.73 03/18/2019   GFRNONAA 96 03/18/2019   GFRAA 110 03/18/2019   CALCIUM 9.3 03/18/2019   PROT 6.4 03/18/2019   ALBUMIN 4.5 03/18/2019   LABGLOB 1.9 03/18/2019   AGRATIO 2.4 (H) 03/18/2019   BILITOT 0.6 03/18/2019   ALKPHOS 71 03/18/2019   AST 17 03/18/2019   ALT 19 03/18/2019   ANIONGAP 6 (L) 03/14/2012        Objective:    There were no vitals taken for this visit. BP Readings from Last 3 Encounters:  04/29/19 120/76  03/17/19 122/82  04/09/18 118/80   Wt Readings from Last 3 Encounters:  04/29/19 203 lb (92.1 kg)  03/17/19 193 lb 12.8 oz (87.9 kg)  04/09/18 194 lb 6.4 oz (88.2 kg)          Assessment & Plan:    1. H/O cold sores Stable. Diagnosis pulled for medication refill. Continue current medical treatment plan. - valACYclovir (VALTREX) 1000 MG tablet; TAKE 2 TABLETS BY MOUTH TWICE A DAY AT ONSET OF COLD SORE  Dispense: 12 tablet; Refill: 1  2. Acute non-recurrent pansinusitis Worsening symptoms that have not responded to OTC medications. Will give augmentin as below. Continue allergy medications. Stay well hydrated and get plenty of rest. Call if no symptom improvement or if symptoms worsen. - amoxicillin-clavulanate (AUGMENTIN) 875-125 MG tablet; Take 1 tablet by mouth 2 (two) times daily.  Dispense: 20 tablet; Refill: 0  3. Bronchitis Worsening despite using albuterol inhaler q 6 hrs prn. Will give prednisone as below. May use Mucinex for congestion and cough. Push fluids.  - predniSONE (STERAPRED UNI-PAK 21 TAB) 10 MG (21) TBPK tablet; 6 day taper; take as directed on package instructions  Dispense: 21 tablet; Refill: 0  4. Antibiotic-induced yeast infection Gets yeast infections with antibiotics. Diflucan given as below.  - fluconazole (DIFLUCAN) 150 MG tablet; Take 1 tablet (150 mg total) by mouth once for 1  dose.  Dispense: 1 tablet; Refill: 0   No follow-ups on file.    I discussed the assessment and treatment plan with the patient. The patient was provided an opportunity to ask questions and all were answered. The patient agreed with the plan and demonstrated an understanding of the instructions.   The patient was advised to call back or seek an in-person evaluation if the symptoms worsen or if the condition fails to improve as anticipated.  I provided 7 minutes of non-face-to-face time during this encounter.  Reynolds Bowl, PA-C, have reviewed all documentation for this visit. The documentation on 08/14/19 for the exam, diagnosis, procedures, and orders are all accurate and complete.  [facility] [phone] (phone) [phone] (fax)  [place]

## 2019-09-07 ENCOUNTER — Other Ambulatory Visit: Payer: Self-pay | Admitting: Physician Assistant

## 2019-09-07 DIAGNOSIS — F339 Major depressive disorder, recurrent, unspecified: Secondary | ICD-10-CM

## 2019-09-07 NOTE — Telephone Encounter (Signed)
Requested Prescriptions  Pending Prescriptions Disp Refills  . escitalopram (LEXAPRO) 20 MG tablet [Pharmacy Med Name: ESCITALOPRAM 20 MG TABLET] 90 tablet 0    Sig: TAKE 1 TABLET BY MOUTH EVERY DAY     Psychiatry:  Antidepressants - SSRI Failed - 09/07/2019  9:38 AM      Failed - Valid encounter within last 6 months    Recent Outpatient Visits          3 weeks ago Acute non-recurrent pansinusitis   [facility] [place], Clearnce Sorrel, [place]   5 months ago Annual physical exam   [place], Clearnce Sorrel, [place]   1 year ago Possible urinary tract infection   [facility] [place], Wendee Beavers, [place]   1 year ago Annual physical exam   [place], Clearnce Sorrel, [place]   1 year ago [place], [place], [place]

## 2019-10-04 ENCOUNTER — Other Ambulatory Visit: Payer: Self-pay | Admitting: Physician Assistant

## 2019-10-04 DIAGNOSIS — J301 Allergic rhinitis due to pollen: Secondary | ICD-10-CM

## 2019-10-04 NOTE — Telephone Encounter (Signed)
Requested Prescriptions  Pending Prescriptions Disp Refills   montelukast (SINGULAIR) 10 MG tablet [Pharmacy Med Name: MONTELUKAST SOD 10 MG TABLET] 90 tablet 1    Sig: TAKE 1 TABLET BY MOUTH EVERYDAY AT BEDTIME     Pulmonology:  Leukotriene Inhibitors Failed - 10/04/2019 12:43 PM      Failed - Valid encounter within last 12 months    Recent Outpatient Visits          1 month ago Acute non-recurrent pansinusitis   [facility] [place], Clearnce Sorrel, PA-C   6 months ago Annual physical exam   [place], Clearnce Sorrel, [place]   1 year ago Possible urinary tract infection   [facility] [place], Wendee Beavers, [place]   1 year ago Annual physical exam   [place], Clearnce Sorrel, [place]   1 year ago [place], [place], [place]

## 2019-11-30 ENCOUNTER — Other Ambulatory Visit: Payer: Self-pay | Admitting: Physician Assistant

## 2019-11-30 DIAGNOSIS — F339 Major depressive disorder, recurrent, unspecified: Secondary | ICD-10-CM

## 2019-11-30 NOTE — Telephone Encounter (Signed)
Requested Prescriptions  Pending Prescriptions Disp Refills  . escitalopram (LEXAPRO) 20 MG tablet [Pharmacy Med Name: ESCITALOPRAM 20 MG TABLET] 90 tablet 0    Sig: TAKE 1 TABLET BY MOUTH EVERY DAY     Psychiatry:  Antidepressants - SSRI Failed - 11/30/2019  9:15 AM      Failed - Valid encounter within last 6 months    Recent Outpatient Visits          3 months ago Acute non-recurrent pansinusitis   [facility] [place], Clearnce Sorrel, [place]   8 months ago Annual physical exam   [facility] [place], Clearnce Sorrel, [place]   1 year ago Possible urinary tract infection   [facility] [place], Wendee Beavers, [place]   1 year ago Annual physical exam   [facility] [place], Clearnce Sorrel, [place]   1 year ago [place], [place], [place]      Future Appointments            In 3 months [place], Clearnce Sorrel, PA-C [employer], [place]

## 2019-12-17 IMAGING — MG MM DIGITAL SCREENING BILAT W/ TOMO W/ CAD
9 of 15 series · 9 of 31 positions shown · non-contrast
Comparison: Previous exam(s).

CLINICAL DATA: Screening.

EXAM:
2D DIGITAL SCREENING BILATERAL MAMMOGRAM WITH 3D TOMO WITH CAD

[R XCCL]
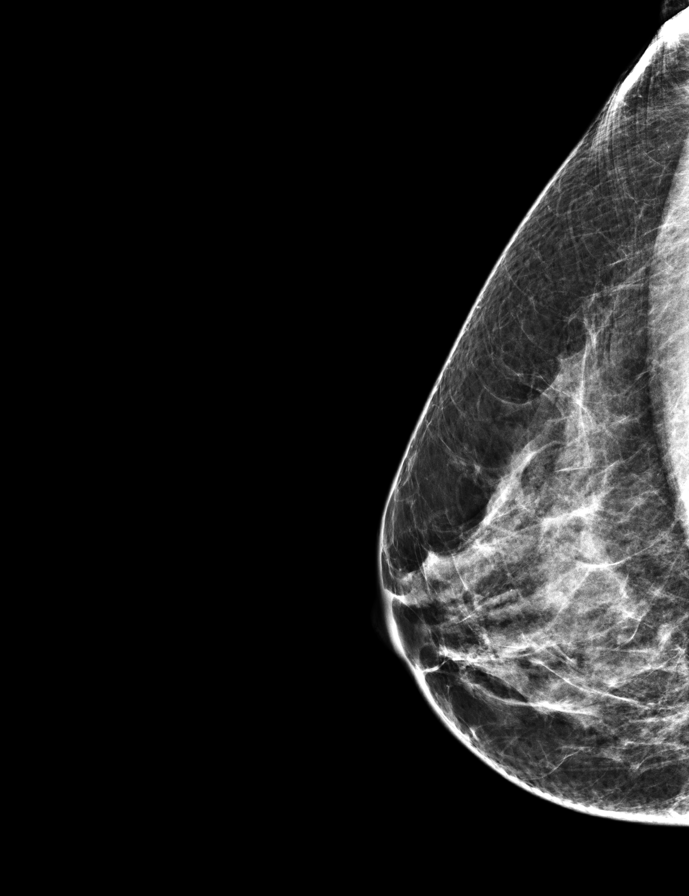

[L MLO synth-2D]
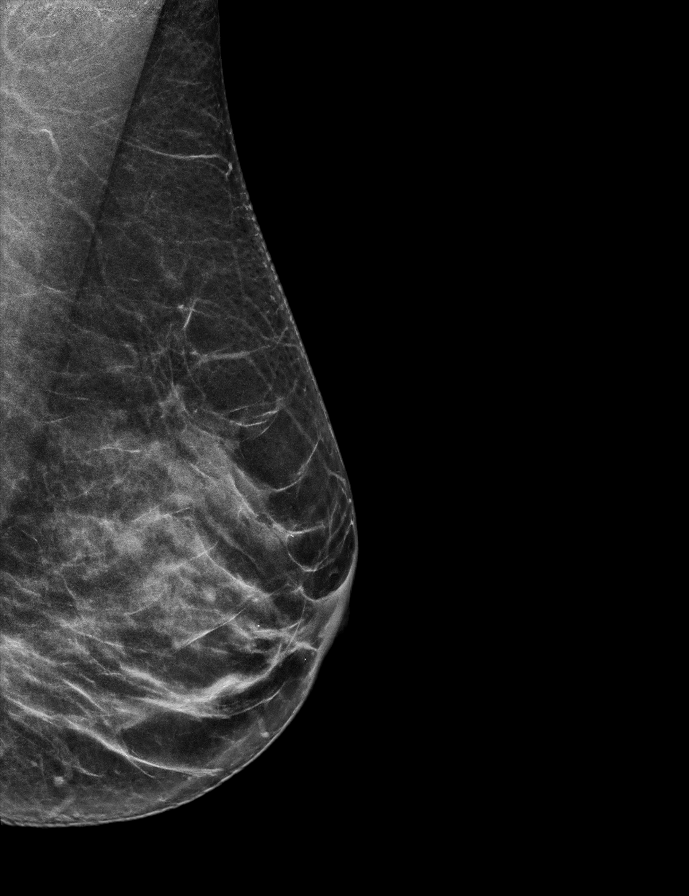

[R CC]
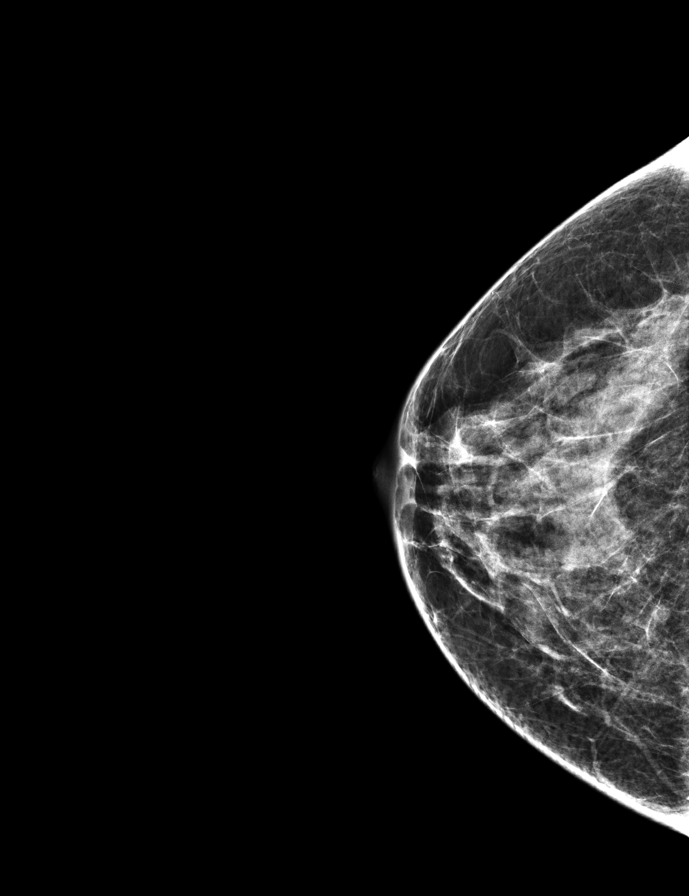

[L MLO]
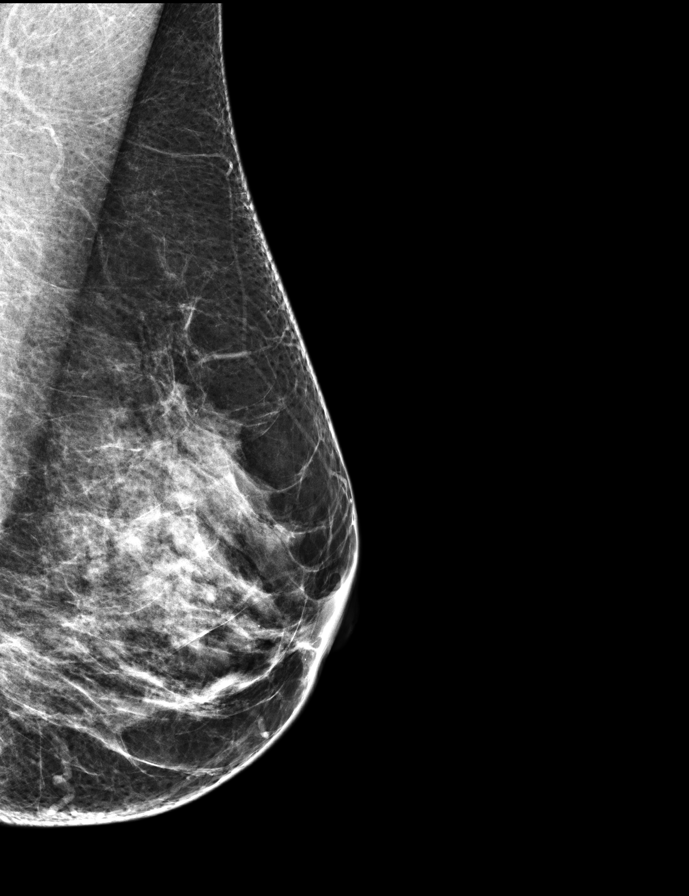

[R MLO synth-2D]
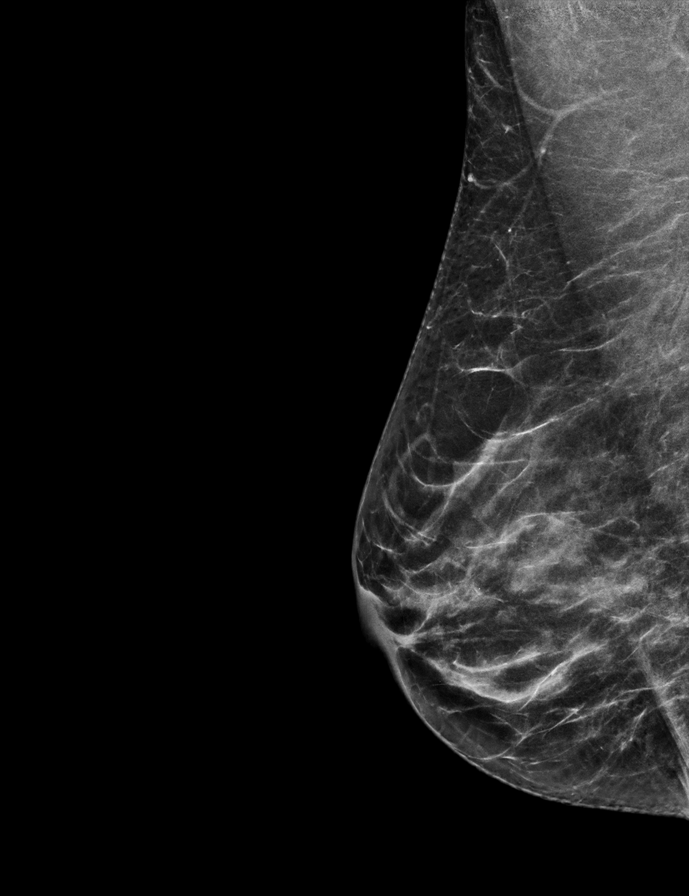

[L CC synth-2D]
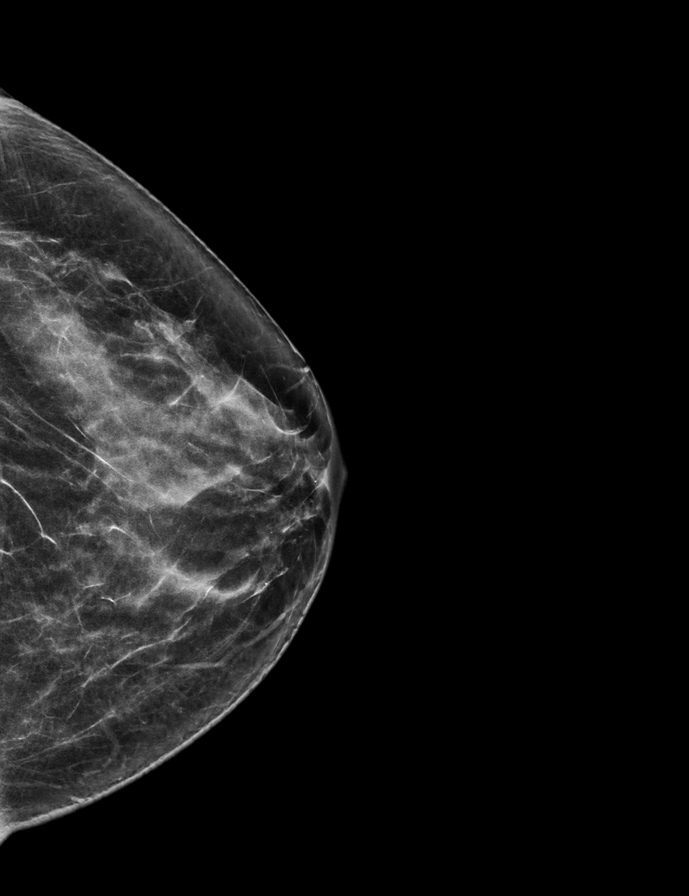

[R MLO]
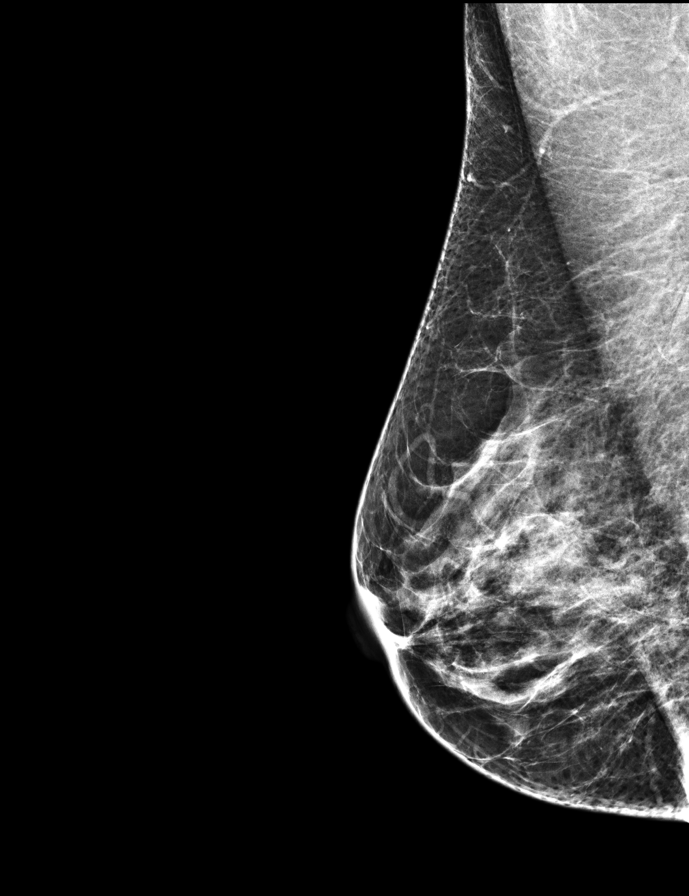

[L CC]
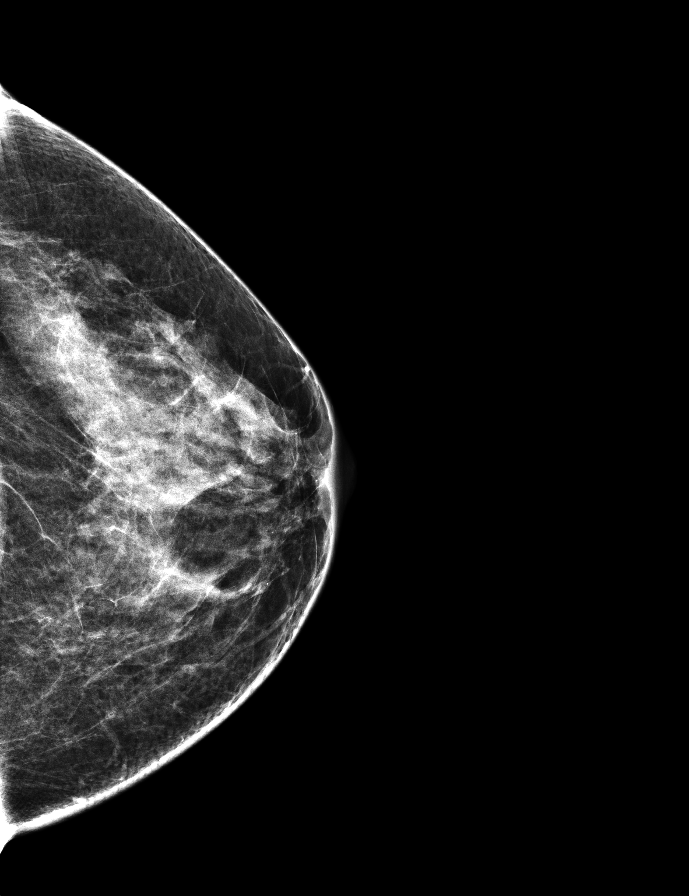

[R CC synth-2D]
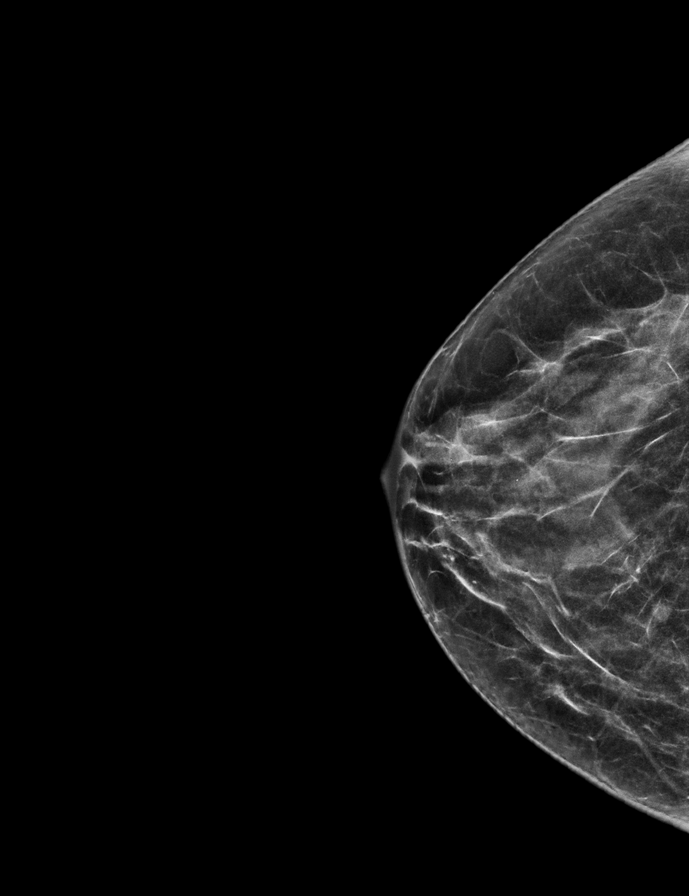

[9 of 31 positions shown; findings below may reference images not displayed]

ACR Breast Density Category c: The breast tissue is heterogeneously
dense, which may obscure small masses.
FINDINGS: In the right breast, a possible mass warrants further evaluation. In
the left breast, no findings suspicious for malignancy. Images were
processed with CAD.
IMPRESSION: Further evaluation is suggested for possible mass in the right
breast.

RECOMMENDATION:
Diagnostic mammogram and possibly ultrasound of the right breast.
(Code:[id number])

The patient will be contacted regarding the findings, and additional
imaging will be scheduled.

BI-RADS CATEGORY  0: Incomplete. Need additional imaging evaluation
and/or prior mammograms for comparison.

## 2019-12-23 ENCOUNTER — Other Ambulatory Visit: Payer: Self-pay | Admitting: Physician Assistant

## 2019-12-23 DIAGNOSIS — F339 Major depressive disorder, recurrent, unspecified: Secondary | ICD-10-CM

## 2019-12-23 NOTE — Telephone Encounter (Signed)
   Notes to clinic:   REQUEST FOR 90 DAYS PRESCRIPTION  Requested Prescriptions  Pending Prescriptions Disp Refills   escitalopram (LEXAPRO) 20 MG tablet [Pharmacy Med Name: ESCITALOPRAM 20 MG TABLET] 90 tablet 1    Sig: TAKE 1 TABLET BY MOUTH EVERY DAY      Psychiatry:  Antidepressants - SSRI Failed - 12/23/2019 12:30 PM      Failed - Valid encounter within last 6 months    Recent Outpatient Visits           4 months ago Acute non-recurrent pansinusitis   [facility] [place], [place]   9 months ago Annual physical exam   [place], Clearnce Sorrel, [place]   1 year ago Possible urinary tract infection   [facility] [place], Wendee Beavers, [place]   1 year ago Annual physical exam   [facility] [place], Clearnce Sorrel, [place]   1 year ago [place], Wendee Beavers, [place]       Future Appointments             In 3 months Burnette, Clearnce Sorrel, PA-C [employer], [place]

## 2020-03-12 ENCOUNTER — Other Ambulatory Visit: Payer: Self-pay

## 2020-03-12 ENCOUNTER — Encounter: Payer: Self-pay | Admitting: Physician Assistant

## 2020-03-12 ENCOUNTER — Ambulatory Visit (INDEPENDENT_AMBULATORY_CARE_PROVIDER_SITE_OTHER): Payer: BC Managed Care – PPO | Admitting: Physician Assistant

## 2020-03-12 VITALS — BP 122/83 | HR 106 | Temp 97.7°F | Ht 65.5 in | Wt 221.0 lb

## 2020-03-12 DIAGNOSIS — F32A Depression, unspecified: Secondary | ICD-10-CM

## 2020-03-12 DIAGNOSIS — E7800 Pure hypercholesterolemia, unspecified: Secondary | ICD-10-CM

## 2020-03-12 DIAGNOSIS — Z6836 Body mass index (BMI) 36.0-36.9, adult: Secondary | ICD-10-CM

## 2020-03-12 DIAGNOSIS — Z1239 Encounter for other screening for malignant neoplasm of breast: Secondary | ICD-10-CM | POA: Diagnosis not present

## 2020-03-12 DIAGNOSIS — F419 Anxiety disorder, unspecified: Secondary | ICD-10-CM

## 2020-03-12 DIAGNOSIS — F339 Major depressive disorder, recurrent, unspecified: Secondary | ICD-10-CM | POA: Insufficient documentation

## 2020-03-12 DIAGNOSIS — Z0000 Encounter for general adult medical examination without abnormal findings: Secondary | ICD-10-CM

## 2020-03-12 DIAGNOSIS — Z8619 Personal history of other infectious and parasitic diseases: Secondary | ICD-10-CM

## 2020-03-12 DIAGNOSIS — Z1159 Encounter for screening for other viral diseases: Secondary | ICD-10-CM

## 2020-03-12 DIAGNOSIS — J301 Allergic rhinitis due to pollen: Secondary | ICD-10-CM

## 2020-03-12 DIAGNOSIS — J0140 Acute pansinusitis, unspecified: Secondary | ICD-10-CM | POA: Diagnosis not present

## 2020-03-12 MED ORDER — MONTELUKAST SODIUM 10 MG PO TABS: ORAL_TABLET | ORAL | 3 refills | Status: DC

## 2020-03-12 MED ORDER — ESCITALOPRAM OXALATE 20 MG PO TABS: 20.0000 mg | ORAL_TABLET | Freq: Every day | ORAL | 3 refills | Status: DC

## 2020-03-12 MED ORDER — ALPRAZOLAM 0.25 MG PO TABS: 0.2500 mg | ORAL_TABLET | Freq: Two times a day (BID) | ORAL | 1 refills | Status: DC | PRN

## 2020-03-12 MED ORDER — AMOXICILLIN-POT CLAVULANATE 875-125 MG PO TABS: 1.0000 | ORAL_TABLET | Freq: Two times a day (BID) | ORAL | 0 refills | Status: DC

## 2020-03-12 MED ORDER — VALACYCLOVIR HCL 1 G PO TABS: ORAL_TABLET | ORAL | 3 refills | Status: DC

## 2020-03-12 NOTE — Patient Instructions (Signed)
[facility] [address] [place],  [place]  [address] Main: [phone]   Preventive Care 40-52 Years Old, Female Preventive care refers to visits with your health care provider and lifestyle choices that can promote health and wellness. This includes:  A yearly physical exam. This may also be called an annual well check.  Regular dental visits and eye exams.  Immunizations.  Screening for certain conditions.  Healthy lifestyle choices, such as eating a healthy diet, getting regular exercise, not using drugs or products that contain nicotine and tobacco, and limiting alcohol use. What can I expect for my preventive care visit? Physical exam Your health care provider will check your:  Height and weight. This may be used to calculate body mass index (BMI), which tells if you are at a healthy weight.  Heart rate and blood pressure.  Skin for abnormal spots. Counseling Your health care provider may ask you questions about your:  Alcohol, tobacco, and drug use.  Emotional well-being.  Home and relationship well-being.  Sexual activity.  Eating habits.  Work and work environment.  Method of birth control.  Menstrual cycle.  Pregnancy history. What immunizations do I need?  Influenza (flu) vaccine  This is recommended every year. Tetanus, diphtheria, and pertussis (Tdap) vaccine  You may need a Td booster every 10 years. Varicella (chickenpox) vaccine  You may need this if you have not been vaccinated. Zoster (shingles) vaccine  You may need this after age 60. Measles, mumps, and rubella (MMR) vaccine  You may need at least one dose of MMR if you were born in 1957 or later. You may also need a second dose. Pneumococcal conjugate (PCV13) vaccine  You may need this if you have certain conditions and were not previously vaccinated. Pneumococcal polysaccharide (PPSV23) vaccine  You may need one or two doses if you smoke  cigarettes or if you have certain conditions. Meningococcal conjugate (MenACWY) vaccine  You may need this if you have certain conditions. Hepatitis A vaccine  You may need this if you have certain conditions or if you travel or work in places where you may be exposed to hepatitis A. Hepatitis B vaccine  You may need this if you have certain conditions or if you travel or work in places where you may be exposed to hepatitis B. Haemophilus influenzae type b (Hib) vaccine  You may need this if you have certain conditions. Human papillomavirus (HPV) vaccine  If recommended by your health care provider, you may need three doses over 6 months. You may receive vaccines as individual doses or as more than one vaccine together in one shot (combination vaccines). Talk with your health care provider about the risks and benefits of combination vaccines. What tests do I need? Blood tests  Lipid and cholesterol levels. These may be checked every 5 years, or more frequently if you are over 50 years old.  Hepatitis C test.  Hepatitis B test. Screening  Lung cancer screening. You may have this screening every year starting at age 55 if you have a 30-pack-year history of smoking and currently smoke or have quit within the past 15 years.  Colorectal cancer screening. All adults should have this screening starting at age 50 and continuing until age 75. Your health care provider may recommend screening at age 45 if you are at increased risk. You will have tests every 1-10 years, depending on your results and the type of screening test.  Diabetes screening. This is   done by checking your blood sugar (glucose) after you have not eaten for a while (fasting). You may have this done every 1-3 years.  Mammogram. This may be done every 1-2 years. Talk with your health care provider about when you should start having regular mammograms. This may depend on whether you have a family history of breast  cancer.  BRCA-related cancer screening. This may be done if you have a family history of breast, ovarian, tubal, or peritoneal cancers.  Pelvic exam and Pap test. This may be done every 3 years starting at age 36. Starting at age 66, this may be done every 5 years if you have a Pap test in combination with an HPV test. Other tests  Sexually transmitted disease (STD) testing.  Bone density scan. This is done to screen for osteoporosis. You may have this scan if you are at high risk for osteoporosis. Follow these instructions at home: Eating and drinking  Eat a diet that includes fresh fruits and vegetables, whole grains, lean protein, and low-fat dairy.  Take vitamin and mineral supplements as recommended by your health care provider.  Do not drink alcohol if: ? Your health care provider tells you not to drink. ? You are pregnant, may be pregnant, or are planning to become pregnant.  If you drink alcohol: ? Limit how much you have to 0-1 drink a day. ? Be aware of how much alcohol is in your drink. In the [place] one drink equals one 12 oz bottle of beer (355 mL), one 5 oz glass of wine (148 mL), or one 1 oz glass of hard liquor (44 mL). Lifestyle  Take daily care of your teeth and gums.  Stay active. Exercise for at least 30 minutes on 5 or more days each week.  Do not use any products that contain nicotine or tobacco, such as cigarettes, e-cigarettes, and chewing tobacco. If you need help quitting, ask your health care provider.  If you are sexually active, practice safe sex. Use a condom or other form of birth control (contraception) in order to prevent pregnancy and STIs (sexually transmitted infections).  If told by your health care provider, take low-dose aspirin daily starting at age 55. What's next?  Visit your health care provider once a year for a well check visit.  Ask your health care provider how often you should have your eyes and teeth checked.  Stay up to date  on all vaccines. This information is not intended to replace advice given to you by your health care provider. Make sure you discuss any questions you have with your health care provider. Document Revised: 12/20/2017 Document Reviewed: 12/20/2017 Elsevier Patient Education  2020 [employer].

## 2020-03-12 NOTE — Progress Notes (Signed)
Complete physical exam   Patient: Jody Nolan   DOB: [birth year]   54 y.o. Female  MRN: [id number] Visit Date: 03/12/2020  Today's healthcare provider: Mar Daring, PA-C   Chief Complaint  Patient presents with  . Annual Exam   Subjective    Jody Nolan is a 54 y.o. female who presents today for a complete physical exam.  She reports consuming a general diet. The patient does not participate in regular exercise at present. She generally feels well. She reports sleeping fairly well. She does have additional problems to discuss today.  HPI  Having increased sinus congestion. Feels she has a sinus infection. Reports she gets about this time every year with the weather changes. No fever. No loss of smell or taste. Has had symptoms for about 2-3 weeks.   Past Medical History:  Diagnosis Date  . Allergic rhinitis   . Degeneration of cervical intervertebral disc   . DUB (dysfunctional uterine bleeding)   . Family history of adverse reaction to anesthesia    Mothr - PONV  . Family history of colon cancer   . History of abnormal cervical Pap smear   . History of chicken pox   . Radiculopathy   . Reaction, stress, acute   . Shoulder pain    Past Surgical History:  Procedure Laterality Date  . ABDOMINAL HYSTERECTOMY     partial  . CESAREAN SECTION  2000  . COLONOSCOPY WITH PROPOFOL N/A 03/29/2018   Procedure: COLONOSCOPY WITH BIOPSY;  Surgeon: [name], MD;  Location: [facility];  Service: Endoscopy;  Laterality: N/A;  . LEEP  1990's  . POLYPECTOMY N/A 03/29/2018   Procedure: POLYPECTOMY;  Surgeon: [name], MD;  Location: [facility];  Service: Endoscopy;  Laterality: N/A;   Social History   Socioeconomic History  . Marital status: Married    Spouse name: Not on file  . Number of children: 2  . Years of education: [employer]  . Highest education level: Not on file  Occupational History  . Occupation: employed    Comment: Principle  at [employer]    Employer: [employer]  Tobacco Use  . Smoking status: Never Smoker  . Smokeless tobacco: Never Used  Vaping Use  . Vaping Use: Never used  Substance and Sexual Activity  . Alcohol use: Yes    Alcohol/week: 1.0 standard drink    Types: 1 Standard drinks or equivalent per week  . Drug use: No  . Sexual activity: Yes  Other Topics Concern  . Not on file  Social History Narrative  . Not on file   Social Determinants of Health   Financial Resource Strain:   . Difficulty of Paying Living Expenses: Not on file  Food Insecurity:   . Worried About Charity fundraiser in the Last Year: Not on file  . Ran Out of Food in the Last Year: Not on file  Transportation Needs:   . Lack of Transportation (Medical): Not on file  . Lack of Transportation (Non-Medical): Not on file  Physical Activity:   . Days of Exercise per Week: Not on file  . Minutes of Exercise per Session: Not on file  Stress:   . Feeling of Stress : Not on file  Social Connections:   . Frequency of Communication with Friends and Family: Not on file  . Frequency of Social Gatherings with Friends and Family: Not on file  . Attends Religious Services: Not on file  .  Active Member of Clubs or Organizations: Not on file  . Attends Archivist Meetings: Not on file  . Marital Status: Not on file  Intimate Partner Violence:   . Fear of Current or Ex-Partner: Not on file  . Emotionally Abused: Not on file  . Physically Abused: Not on file  . Sexually Abused: Not on file   Family Status  Relation Name Status  . Mother  Alive  . Father  Deceased at age 27       colon cancer  . Brother  Alive  . Daughter  Alive  . Son  Alive  . MGM  Alive, age 38y  . [place], age 17y  . Mat Aunt  (Not Specified)   Family History  Problem Relation Age of Onset  . Breast cancer Mother 26  . Colon cancer Father   . Hypertension Brother   . Stroke Paternal Grandmother   . Hypertension  Paternal Grandmother   . Breast cancer Maternal Aunt 42   Allergies  Allergen Reactions  . Adhesive [Tape] Rash    Bandaids cause rash    Patient Care Team: Rubye Beach as PCP - General (Family Medicine)   Medications: Outpatient Medications Prior to Visit  Medication Sig  . ALPRAZolam (XANAX) 0.25 MG tablet Take 1 tablet (0.25 mg total) by mouth 2 (two) times daily as needed for anxiety.  Marland Kitchen escitalopram (LEXAPRO) 20 MG tablet TAKE 1 TABLET BY MOUTH EVERY DAY  . fluticasone (FLONASE) 50 MCG/ACT nasal spray Place 2 sprays into both nostrils daily.  . montelukast (SINGULAIR) 10 MG tablet TAKE 1 TABLET BY MOUTH EVERYDAY AT BEDTIME  . valACYclovir (VALTREX) 1000 MG tablet TAKE 2 TABLETS BY MOUTH TWICE A DAY AT ONSET OF COLD SORE  . amoxicillin-clavulanate (AUGMENTIN) 875-125 MG tablet Take 1 tablet by mouth 2 (two) times daily.  . Cetirizine HCl (ZYRTEC ALLERGY) 10 MG CAPS Take 1 tablet by mouth daily.  . meloxicam (MOBIC) 15 MG tablet TAKE 1 TABLET BY MOUTH SINGLE DOSE WITH MEALS FOR 2 WEEKS AND THEN AS NEEDED  . predniSONE (STERAPRED UNI-PAK 21 TAB) 10 MG (21) TBPK tablet 6 day taper; take as directed on package instructions   No facility-administered medications prior to visit.    Review of Systems  Constitutional: Negative for fever.  HENT: Positive for congestion, postnasal drip, rhinorrhea, sinus pressure, sinus pain and sore throat (initially, now better). Negative for ear pain.   Eyes: Negative.   Respiratory: Negative.   Cardiovascular: Negative.   Gastrointestinal: Negative.   Endocrine: Negative.   Genitourinary: Negative.   Musculoskeletal: Negative.   Skin: Negative.   Allergic/Immunologic: Positive for environmental allergies. Negative for food allergies and immunocompromised state.  Neurological: Positive for headaches.  Hematological: Negative.   Psychiatric/Behavioral: Negative for agitation, behavioral problems, confusion, decreased concentration,  dysphoric mood, hallucinations, self-injury, sleep disturbance and suicidal ideas. The patient is nervous/anxious. The patient is not hyperactive.       Objective    BP 122/83 (BP Location: Left Arm, Patient Position: Sitting, Cuff Size: Large)   Pulse (!) 106   Temp 97.7 F (36.5 C) (Oral)   Ht 5' 5.5" (1.664 m)   Wt 221 lb (100.2 kg)   BMI 36.22 kg/m    Physical Exam Vitals reviewed.  Constitutional:      General: She is not in acute distress.    Appearance: Normal appearance. She is well-developed. She is obese. She is not ill-appearing or diaphoretic.  HENT:  Head: Normocephalic and atraumatic.     Right Ear: Hearing, tympanic membrane, ear canal and external ear normal.     Left Ear: Hearing, tympanic membrane, ear canal and external ear normal.     Nose: Mucosal edema, congestion and rhinorrhea present. Rhinorrhea is purulent.     Right Sinus: Maxillary sinus tenderness and frontal sinus tenderness present.     Left Sinus: Maxillary sinus tenderness and frontal sinus tenderness present.     Mouth/Throat:     Mouth: Mucous membranes are moist.     Pharynx: Oropharynx is clear. Posterior oropharyngeal erythema (cobblestoning posterior pharynx) present. No oropharyngeal exudate.  Eyes:     General: No scleral icterus.       Right eye: No discharge.        Left eye: No discharge.     Extraocular Movements: Extraocular movements intact.     Conjunctiva/sclera: Conjunctivae normal.     Pupils: Pupils are equal, round, and reactive to light.  Neck:     Thyroid: No thyromegaly.     Vascular: No carotid bruit or JVD.     Trachea: No tracheal deviation.  Cardiovascular:     Rate and Rhythm: Normal rate and regular rhythm.     Pulses: Normal pulses.     Heart sounds: Normal heart sounds. No murmur heard.  No friction rub. No gallop.   Pulmonary:     Effort: Pulmonary effort is normal. No respiratory distress.     Breath sounds: Normal breath sounds. No wheezing or  rales.  Chest:     Chest wall: No tenderness.  Abdominal:     General: Abdomen is flat. Bowel sounds are normal. There is no distension.     Palpations: Abdomen is soft. There is no mass.     Tenderness: There is no abdominal tenderness. There is no guarding or rebound.  Musculoskeletal:        General: No tenderness. Normal range of motion.     Cervical back: Normal range of motion and neck supple. No tenderness.     Right lower leg: No edema.     Left lower leg: No edema.  Lymphadenopathy:     Cervical: No cervical adenopathy.  Skin:    General: Skin is warm and dry.     Capillary Refill: Capillary refill takes less than 2 seconds.     Findings: No rash.  Neurological:     General: No focal deficit present.     Mental Status: She is alert and oriented to person, place, and time. Mental status is at baseline.  Psychiatric:        Mood and Affect: Mood normal.        Behavior: Behavior normal.        Thought Content: Thought content normal.        Judgment: Judgment normal.      Last depression screening scores PHQ 2/9 Scores 03/12/2020 03/17/2019 03/06/2018  PHQ - 2 Score 2 1 3   PHQ- 9 Score 7 6 13    Last fall risk screening Fall Risk  03/12/2020  Falls in the past year? 0  Number falls in past yr: 0  Injury with Fall? 0  Risk for fall due to : No Fall Risks  Follow up Falls evaluation completed   Last Audit-C alcohol use screening Alcohol Use Disorder Test (AUDIT) 03/12/2020  1. How often do you have a drink containing alcohol? 1  2. How many drinks containing alcohol do you have on a typical  day when you are drinking? 1  3. How often do you have six or more drinks on one occasion? 0  AUDIT-C Score 2  Alcohol Brief Interventions/Follow-up AUDIT Score <7 follow-up not indicated   A score of 3 or more in women, and 4 or more in men indicates increased risk for alcohol abuse, EXCEPT if all of the points are from question 1   No results found for any visits on  03/12/20.  Assessment & Plan    Routine Health Maintenance and Physical Exam  Exercise Activities and Dietary recommendations Goals   None     Immunization History  Administered Date(s) Administered  . PFIZER SARS-COV-2 Vaccination 06/19/2019, 07/15/2019, 01/27/2020  . Td 06/28/2010  . Tdap 06/28/2010    Health Maintenance  Topic Date Due  . Hepatitis C Screening  Never done  . INFLUENZA VACCINE  07/22/2020 (Originally 11/23/2019)  . TETANUS/TDAP  06/27/2020  . MAMMOGRAM  04/27/2021  . PAP SMEAR-Modifier  03/16/2022  . COLONOSCOPY  03/30/2023  . COVID-19 Vaccine  Completed  . HIV Screening  Completed    Discussed health benefits of physical activity, and encouraged her to engage in regular exercise appropriate for her age and condition.  1. Annual physical exam Normal physical exam today. Will check labs as below and f/u pending lab results. If labs are stable and WNL she will not need to have these rechecked for one year at her next annual physical exam. She is to call the office in the meantime if she has any acute issue, questions or concerns. - CBC w/Diff/Platelet - Comprehensive Metabolic Panel (CMET) - TSH - Lipid Panel With LDL/HDL Ratio  2. Encounter for breast cancer screening using non-mammogram modality Breast exam today was normal. There is no family history of breast cancer. She does perform regular self breast exams. Mammogram was ordered as below. Information for [facility] clinic was given to patient so she may schedule her mammogram at her convenience. - MM 3D SCREEN BREAST BILATERAL; Future  3. Hypercholesterolemia Stable. Diet controlled. Will check labs as below and f/u pending results. - Lipid Panel With LDL/HDL Ratio  4. Class 2 severe obesity due to excess calories with serious comorbidity and body mass index (BMI) of 36.0 to 36.9 in adult [facility]) Counseled patient on healthy lifestyle modifications including dieting and exercise.  Will check  labs as below and f/u pending results. - Comprehensive Metabolic Panel (CMET) - Lipid Panel With LDL/HDL Ratio  5. Depression, recurrent ([place]) Worsening. Will increase lexapro from 10mg  to 20mg  as noted below.  - escitalopram (LEXAPRO) 20 MG tablet; Take 1 tablet (20 mg total) by mouth daily.  Dispense: 90 tablet; Refill: 3  6. Acute non-recurrent pansinusitis Worsening symptoms that have not responded to OTC medications. Will give augmentin as below. Continue allergy medications. Stay well hydrated and get plenty of rest. Call if no symptom improvement or if symptoms worsen. - amoxicillin-clavulanate (AUGMENTIN) 875-125 MG tablet; Take 1 tablet by mouth 2 (two) times daily.  Dispense: 20 tablet; Refill: 0  7. Encounter for hepatitis C screening test for low risk patient Will check labs as below and f/u pending results. - Hepatitis C Antibody  8. Anxiety and depression Stable. Diagnosis pulled for medication refill. Continue current medical treatment plan. - ALPRAZolam (XANAX) 0.25 MG tablet; Take 1 tablet (0.25 mg total) by mouth 2 (two) times daily as needed for anxiety.  Dispense: 180 tablet; Refill: 1  9. Non-seasonal allergic rhinitis due to pollen Stable. Diagnosis pulled  for medication refill. Continue current medical treatment plan. - montelukast (SINGULAIR) 10 MG tablet; TAKE 1 TABLET BY MOUTH EVERYDAY AT BEDTIME  Dispense: 90 tablet; Refill: 3  10. H/O cold sores Stable. Diagnosis pulled for medication refill. Continue current medical treatment plan. - valACYclovir (VALTREX) 1000 MG tablet; TAKE 2 TABLETS BY MOUTH TWICE A DAY AT ONSET OF COLD SORE  Dispense: 12 tablet; Refill: 3   No follow-ups on file.     Reynolds Bowl, PA-C, have reviewed all documentation for this visit. The documentation on 03/17/20 for the exam, diagnosis, procedures, and orders are all accurate and complete.   Rubye Beach  [facility] [phone]  (phone) [phone] (fax)  [place]

## 2020-03-13 LAB — CBC WITH DIFFERENTIAL/PLATELET
Basophils Absolute: 0.1 10*3/uL (ref 0.0–0.2)
Basos: 1 %
EOS (ABSOLUTE): 0.2 10*3/uL (ref 0.0–0.4)
Eos: 3 %
Hematocrit: 44.7 % (ref 34.0–46.6)
Hemoglobin: 14.6 g/dL (ref 11.1–15.9)
Immature Grans (Abs): 0.1 10*3/uL (ref 0.0–0.1)
Immature Granulocytes: 1 %
Lymphocytes Absolute: 1.7 10*3/uL (ref 0.7–3.1)
Lymphs: 19 %
MCH: 28.9 pg (ref 26.6–33.0)
MCHC: 32.7 g/dL (ref 31.5–35.7)
MCV: 89 fL (ref 79–97)
Monocytes Absolute: 0.6 10*3/uL (ref 0.1–0.9)
Monocytes: 7 %
Neutrophils Absolute: 6.5 10*3/uL (ref 1.4–7.0)
Neutrophils: 69 %
Platelets: 381 10*3/uL (ref 150–450)
RBC: 5.05 x10E6/uL (ref 3.77–5.28)
RDW: 12.6 % (ref 11.7–15.4)
WBC: 9.2 10*3/uL (ref 3.4–10.8)

## 2020-03-13 LAB — LIPID PANEL WITH LDL/HDL RATIO
Cholesterol, Total: 238 mg/dL — ABNORMAL HIGH (ref 100–199)
HDL: 65 mg/dL (ref >39)
LDL Chol Calc (NIH): 133 mg/dL — ABNORMAL HIGH (ref 0–99)
LDL/HDL Ratio: 2 ratio (ref 0.0–3.2)
Triglycerides: 225 mg/dL — ABNORMAL HIGH (ref 0–149)
VLDL Cholesterol Cal: 40 mg/dL (ref 5–40)

## 2020-03-13 LAB — COMPREHENSIVE METABOLIC PANEL
ALT: 17 IU/L (ref 0–32)
AST: 17 IU/L (ref 0–40)
Albumin/Globulin Ratio: 1.8 (ref 1.2–2.2)
Albumin: 4.4 g/dL (ref 3.8–4.9)
Alkaline Phosphatase: 71 IU/L (ref 44–121)
BUN/Creatinine Ratio: 17 (ref 9–23)
BUN: 16 mg/dL (ref 6–24)
Bilirubin Total: 0.4 mg/dL (ref 0.0–1.2)
CO2: 25 mmol/L (ref 20–29)
Calcium: 9.8 mg/dL (ref 8.7–10.2)
Chloride: 101 mmol/L (ref 96–106)
Creatinine, Ser: 0.93 mg/dL (ref 0.57–1.00)
GFR calc Af Amer: 82 mL/min/{1.73_m2} (ref >59)
GFR calc non Af Amer: 71 mL/min/{1.73_m2} (ref >59)
Globulin, Total: 2.5 g/dL (ref 1.5–4.5)
Glucose: 153 mg/dL — ABNORMAL HIGH (ref 65–99)
Potassium: 4.5 mmol/L (ref 3.5–5.2)
Sodium: 143 mmol/L (ref 134–144)
Total Protein: 6.9 g/dL (ref 6.0–8.5)

## 2020-03-13 LAB — TSH: TSH: 2.49 u[IU]/mL (ref 0.450–4.500)

## 2020-03-13 LAB — HEPATITIS C ANTIBODY: Hep C Virus Ab: <0.1 s/co ratio (ref 0.0–0.9)

## 2020-03-17 ENCOUNTER — Encounter: Payer: Self-pay | Admitting: Physician Assistant

## 2020-03-22 ENCOUNTER — Encounter: Payer: Self-pay | Admitting: Physician Assistant

## 2020-03-23 LAB — HGB A1C W/O EAG: Hgb A1c MFr Bld: 6.1 % — ABNORMAL HIGH (ref 4.8–5.6)

## 2020-03-23 LAB — SPECIMEN STATUS REPORT

## 2020-07-13 ENCOUNTER — Telehealth: Payer: Self-pay | Admitting: Physician Assistant

## 2020-07-13 DIAGNOSIS — R928 Other abnormal and inconclusive findings on diagnostic imaging of breast: Secondary | ICD-10-CM

## 2020-07-13 DIAGNOSIS — Z1239 Encounter for other screening for malignant neoplasm of breast: Secondary | ICD-10-CM

## 2020-07-13 NOTE — Telephone Encounter (Signed)
Patient advised.

## 2020-07-13 NOTE — Telephone Encounter (Signed)
changed

## 2020-07-13 NOTE — Telephone Encounter (Signed)
Please advise 

## 2020-07-13 NOTE — Telephone Encounter (Signed)
Patient is calling to ask the doctor to change the orders she sent for patient to have a mammogram.  She stated that they told her the order needs to be changed to say yearly mammogram plus ultrasound asap so that she can get the appt. For 4/1.  Patient stated there are only 2 appts. Left for that day and this is why it needs to be done asap.  Please advise.  CB# [phone]

## 2020-07-23 ENCOUNTER — Ambulatory Visit
Admission: RE | Admit: 2020-07-23 | Discharge: 2020-07-23 | Disposition: A | Discharge status: Home / Self Care | Payer: BC Managed Care – PPO | Source: Ambulatory Visit | Attending: Physician Assistant | Admitting: Physician Assistant

## 2020-07-23 ENCOUNTER — Other Ambulatory Visit: Payer: Self-pay

## 2020-07-23 DIAGNOSIS — R928 Other abnormal and inconclusive findings on diagnostic imaging of breast: Secondary | ICD-10-CM | POA: Diagnosis present

## 2020-07-23 DIAGNOSIS — Z1239 Encounter for other screening for malignant neoplasm of breast: Secondary | ICD-10-CM

## 2021-03-10 ENCOUNTER — Other Ambulatory Visit: Payer: Self-pay | Admitting: Physician Assistant

## 2021-03-10 DIAGNOSIS — J301 Allergic rhinitis due to pollen: Secondary | ICD-10-CM

## 2021-03-11 ENCOUNTER — Encounter: Payer: Self-pay | Admitting: Family Medicine

## 2021-03-11 ENCOUNTER — Encounter: Payer: Self-pay | Admitting: Physician Assistant

## 2021-03-11 ENCOUNTER — Other Ambulatory Visit: Payer: Self-pay

## 2021-03-11 ENCOUNTER — Ambulatory Visit: Payer: BC Managed Care – PPO

## 2021-03-11 ENCOUNTER — Ambulatory Visit (INDEPENDENT_AMBULATORY_CARE_PROVIDER_SITE_OTHER): Payer: BC Managed Care – PPO | Admitting: Family Medicine

## 2021-03-11 VITALS — BP 125/92 | HR 92 | Resp 16 | Wt 205.9 lb

## 2021-03-11 DIAGNOSIS — E782 Mixed hyperlipidemia: Secondary | ICD-10-CM | POA: Insufficient documentation

## 2021-03-11 DIAGNOSIS — Z8619 Personal history of other infectious and parasitic diseases: Secondary | ICD-10-CM

## 2021-03-11 DIAGNOSIS — Z09 Encounter for follow-up examination after completed treatment for conditions other than malignant neoplasm: Secondary | ICD-10-CM | POA: Insufficient documentation

## 2021-03-11 DIAGNOSIS — F419 Anxiety disorder, unspecified: Secondary | ICD-10-CM

## 2021-03-11 DIAGNOSIS — Z1231 Encounter for screening mammogram for malignant neoplasm of breast: Secondary | ICD-10-CM

## 2021-03-11 DIAGNOSIS — R030 Elevated blood-pressure reading, without diagnosis of hypertension: Secondary | ICD-10-CM | POA: Insufficient documentation

## 2021-03-11 DIAGNOSIS — R7303 Prediabetes: Secondary | ICD-10-CM | POA: Diagnosis not present

## 2021-03-11 DIAGNOSIS — E66811 Obesity, class 1: Secondary | ICD-10-CM | POA: Insufficient documentation

## 2021-03-11 DIAGNOSIS — Z6833 Body mass index (BMI) 33.0-33.9, adult: Secondary | ICD-10-CM

## 2021-03-11 DIAGNOSIS — R7309 Other abnormal glucose: Secondary | ICD-10-CM | POA: Insufficient documentation

## 2021-03-11 DIAGNOSIS — F339 Major depressive disorder, recurrent, unspecified: Secondary | ICD-10-CM

## 2021-03-11 DIAGNOSIS — J301 Allergic rhinitis due to pollen: Secondary | ICD-10-CM

## 2021-03-11 DIAGNOSIS — F32A Depression, unspecified: Secondary | ICD-10-CM

## 2021-03-11 DIAGNOSIS — E669 Obesity, unspecified: Secondary | ICD-10-CM | POA: Insufficient documentation

## 2021-03-11 DIAGNOSIS — Z0000 Encounter for general adult medical examination without abnormal findings: Secondary | ICD-10-CM | POA: Diagnosis not present

## 2021-03-11 MED ORDER — VALACYCLOVIR HCL 1 G PO TABS: ORAL_TABLET | ORAL | 1 refills | Status: DC

## 2021-03-11 MED ORDER — PHENTERMINE HCL 37.5 MG PO CAPS: 37.5000 mg | ORAL_CAPSULE | ORAL | 0 refills | Status: DC

## 2021-03-11 MED ORDER — ESCITALOPRAM OXALATE 20 MG PO TABS: 20.0000 mg | ORAL_TABLET | Freq: Every day | ORAL | 3 refills | Status: DC

## 2021-03-11 MED ORDER — ALPRAZOLAM 0.25 MG PO TABS: 0.2500 mg | ORAL_TABLET | Freq: Every day | ORAL | 1 refills | Status: DC

## 2021-03-11 MED ORDER — FLUTICASONE PROPIONATE 50 MCG/ACT NA SUSP
2.0000 | Freq: Two times a day (BID) | NASAL | 11 refills | Status: DC
Start: 2021-03-11 — End: 2022-11-16

## 2021-03-11 MED ORDER — MONTELUKAST SODIUM 10 MG PO TABS: ORAL_TABLET | ORAL | 3 refills | Status: DC

## 2021-03-11 NOTE — Assessment & Plan Note (Signed)
Recently seen in UC for left side edema; now resolved Report of kidney infection- on abx Denies blood in urine No cva tenderness on exam No bladder tenderness No paperwork seen in care everywhere and not present on exam

## 2021-03-11 NOTE — Assessment & Plan Note (Signed)
Pulled for medication refill 

## 2021-03-11 NOTE — Assessment & Plan Note (Signed)
Reassurance provided; likely diet/stress related May be fluid related d/t recent infection

## 2021-03-11 NOTE — Assessment & Plan Note (Signed)
UTD on dental and eye exams; skin appt got bumped d/t COVID reinforced use of sun screen given tanned skin Things to do to keep yourself healthy  - Exercise at least 30-45 minutes a day, 3-4 days a week.  - Eat a low-fat diet with lots of fruits and vegetables, up to 7-9 servings per day.  - Seatbelts can save your life. Wear them always.  - Smoke detectors on every level of your home, check batteries every year.  - Eye Doctor - have an eye exam every 1-2 years  - Safe sex - if you may be exposed to STDs, use a condom.  - Alcohol -  If you drink, do it moderately, less than 2 drinks per day.  - [place]. Choose someone to speak for you if you are not able.  - Depression is common in our stressful world.If you're feeling down or losing interest in things you normally enjoy, please come in for a visit.  - Violence - If anyone is threatening or hurting you, please call immediately.

## 2021-03-11 NOTE — Assessment & Plan Note (Signed)
Acute on chronic, worse recently Encouraged to use flonase BID  Add robitussin DM for cough Can add mucinex for phlegm

## 2021-03-11 NOTE — Assessment & Plan Note (Signed)
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

## 2021-03-11 NOTE — Progress Notes (Signed)
Complete physical exam   Patient: Jody Nolan   DOB: 1965/08/21   55 y.o. Female  MRN: [id number] Visit Date: 03/11/2021  Today's healthcare provider: Gwyneth Sprout, FNP   Chief Complaint  Patient presents with   Annual Exam   Subjective    HPI  Jody Nolan is a 55 y.o. female who presents today for a complete physical exam.  She reports consuming a general diet. Home exercise routine includes walking. She generally feels fairly well. She reports sleeping poorly. She does have additional problems to discuss today, patient reports recent ED visit for kidney infection and swelling in her left arm and leg, patient states that she is still on antibiotic. Patient reports for the past 7 days or more she has had URI like symptoms and reports scratchy throat, post nasal drip and chest congestion.  Last reported Pap- 03/17/2019 Mammogram-07/23/20 Colonoscopy-03/29/18  Past Medical History:  Diagnosis Date   Allergic rhinitis    Degeneration of cervical intervertebral disc    DUB (dysfunctional uterine bleeding)    Family history of adverse reaction to anesthesia    Mothr - PONV   Family history of colon cancer    History of abnormal cervical Pap smear    History of chicken pox    Radiculopathy    Reaction, stress, acute    Shoulder pain    Past Surgical History:  Procedure Laterality Date   ABDOMINAL HYSTERECTOMY     partial   CESAREAN SECTION  2000   COLONOSCOPY WITH PROPOFOL N/A 03/29/2018   Procedure: COLONOSCOPY WITH BIOPSY;  Surgeon: [name], MD;  Location: [place];  Service: Endoscopy;  Laterality: N/A;   LEEP  1990's   POLYPECTOMY N/A 03/29/2018   Procedure: POLYPECTOMY;  Surgeon: [name], MD;  Location: [place];  Service: Endoscopy;  Laterality: N/A;   Social History   Socioeconomic History   Marital status: Married    Spouse name: Not on file   Number of children: 2   Years of education: College Grad   Highest education  level: Not on file  Occupational History   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  Tobacco Use   Smoking status: Never   Smokeless tobacco: Never  Vaping Use   Vaping Use: Never used  Substance and Sexual Activity   Alcohol use: Yes    Alcohol/week: 1.0 standard drink    Types: 1 Standard drinks or equivalent per week   Drug use: No   Sexual activity: Yes  Other Topics Concern   Not on file  Social History Narrative   Not on file   Social Determinants of Health   Financial Resource Strain: Not on file  Food Insecurity: Not on file  Transportation Needs: Not on file  Physical Activity: Not on file  Stress: Not on file  Social Connections: Not on file  Intimate Partner Violence: Not on file   Family Status  Relation Name Status   Mother  Alive   Father  Deceased at age 49       colon cancer   Brother  Alive   Daughter  Alive   Son  [place], age 11y   [place], age 41y   Mat 34  (Not Specified)   Family History  Problem Relation Age of Onset   Breast cancer Mother 37   Colon cancer Father  Hypertension Brother    Stroke Paternal Grandmother    Hypertension Paternal Grandmother    Breast cancer Maternal Aunt 42   Allergies  Allergen Reactions   Adhesive [Tape] Rash    Bandaids cause rash    Patient Care Team: Gwyneth Sprout, FNP as PCP - General (Family Medicine)   Medications: Outpatient Medications Prior to Visit  Medication Sig   sulfamethoxazole-trimethoprim (BACTRIM DS) 800-160 MG tablet Take 1 tablet by mouth 2 (two) times daily.   [DISCONTINUED] ALPRAZolam (XANAX) 0.25 MG tablet Take 1 tablet (0.25 mg total) by mouth 2 (two) times daily as needed for anxiety.   [DISCONTINUED] escitalopram (LEXAPRO) 20 MG tablet Take 1 tablet (20 mg total) by mouth daily.   [DISCONTINUED] fluticasone (FLONASE) 50 MCG/ACT nasal spray Place 2 sprays into both nostrils daily.    [DISCONTINUED] montelukast (SINGULAIR) 10 MG tablet TAKE 1 TABLET BY MOUTH EVERYDAY AT BEDTIME   [DISCONTINUED] valACYclovir (VALTREX) 1000 MG tablet TAKE 2 TABLETS BY MOUTH TWICE A DAY AT ONSET OF COLD SORE   [DISCONTINUED] amoxicillin-clavulanate (AUGMENTIN) 875-125 MG tablet Take 1 tablet by mouth 2 (two) times daily.   No facility-administered medications prior to visit.    Review of Systems  Constitutional:  Positive for fatigue and unexpected weight change.  HENT:  Positive for congestion.   Respiratory:  Positive for chest tightness, shortness of breath and wheezing.   Cardiovascular:  Positive for leg swelling.  Musculoskeletal:  Positive for arthralgias.  Allergic/Immunologic: Positive for environmental allergies.  All other systems reviewed and are negative.    Objective    BP (!) 125/92   Pulse 92   Resp 16   Wt 205 lb 14.4 oz (93.4 kg)   SpO2 100%   BMI 33.74 kg/m    Physical Exam Vitals and nursing note reviewed.  Constitutional:      General: She is awake. She is not in acute distress.    Appearance: Normal appearance. She is well-developed and well-groomed. She is obese. She is not ill-appearing, toxic-appearing or diaphoretic.  HENT:     Head: Normocephalic and atraumatic.     Jaw: There is normal jaw occlusion. No trismus, tenderness, swelling or pain on movement.     Right Ear: Hearing, tympanic membrane, ear canal and external ear normal. There is no impacted cerumen.     Left Ear: Hearing, tympanic membrane, ear canal and external ear normal. There is no impacted cerumen.     Nose: Rhinorrhea present.     Right Turbinates: Not enlarged, swollen or pale.     Left Turbinates: Not enlarged, swollen or pale.     Right Sinus: No maxillary sinus tenderness or frontal sinus tenderness.     Left Sinus: No maxillary sinus tenderness or frontal sinus tenderness.     Mouth/Throat:     Lips: Pink.     Mouth: Mucous membranes are moist. No injury.     Tongue: No  lesions.     Pharynx: Oropharynx is clear. Uvula midline. No pharyngeal swelling, oropharyngeal exudate, posterior oropharyngeal erythema or uvula swelling.     Tonsils: No tonsillar exudate or tonsillar abscesses.  Eyes:     General: Lids are normal. Lids are everted, no foreign bodies appreciated. Vision grossly intact. Gaze aligned appropriately. No allergic shiner or visual field deficit.       Right eye: No discharge.        Left eye: No discharge.     Extraocular Movements: Extraocular movements intact.  Conjunctiva/sclera: Conjunctivae normal.     Right eye: Right conjunctiva is not injected. No exudate.    Left eye: Left conjunctiva is not injected. No exudate.    Pupils: Pupils are equal, round, and reactive to light.  Neck:     Thyroid: No thyroid mass, thyromegaly or thyroid tenderness.     Vascular: No carotid bruit.     Trachea: Trachea normal.  Cardiovascular:     Rate and Rhythm: Normal rate and regular rhythm.     Pulses: Normal pulses.          Carotid pulses are 2+ on the right side and 2+ on the left side.      Radial pulses are 2+ on the right side and 2+ on the left side.       Dorsalis pedis pulses are 2+ on the right side and 2+ on the left side.       Posterior tibial pulses are 2+ on the right side and 2+ on the left side.     Heart sounds: Normal heart sounds, S1 normal and S2 normal. No murmur heard.   No friction rub. No gallop.  Pulmonary:     Effort: Pulmonary effort is normal. No respiratory distress.     Breath sounds: Normal breath sounds and air entry. No stridor. No wheezing, rhonchi or rales.  Chest:     Chest wall: No tenderness.     Comments: Breasts: breasts appear normal, no suspicious masses, no skin or nipple changes or axillary nodes, right breast normal without mass, skin or nipple changes or axillary nodes, left breast normal without mass, skin or nipple changes or axillary nodes, risk and benefit of breast self-exam was  discussed  Abdominal:     General: Abdomen is flat. Bowel sounds are normal. There is no distension.     Palpations: Abdomen is soft. There is no mass.     Tenderness: There is no abdominal tenderness. There is no right CVA tenderness, left CVA tenderness, guarding or rebound.     Hernia: No hernia is present.  Genitourinary:    Comments: Exam deferred; denies complaints Musculoskeletal:        General: No swelling, tenderness, deformity or signs of injury. Normal range of motion.     Cervical back: Full passive range of motion without pain, normal range of motion and neck supple. No edema, rigidity or tenderness. No muscular tenderness.     Right lower leg: No edema.     Left lower leg: No edema.  Lymphadenopathy:     Cervical: No cervical adenopathy.     Right cervical: No superficial, deep or posterior cervical adenopathy.    Left cervical: No superficial, deep or posterior cervical adenopathy.  Skin:    General: Skin is warm and dry.     Capillary Refill: Capillary refill takes less than 2 seconds.     Coloration: Skin is not jaundiced or pale.     Findings: No bruising, erythema, lesion or rash.  Neurological:     General: No focal deficit present.     Mental Status: She is alert and oriented to person, place, and time. Mental status is at baseline.     GCS: GCS eye subscore is 4. GCS verbal subscore is 5. GCS motor subscore is 6.     Sensory: Sensation is intact. No sensory deficit.     Motor: Motor function is intact. No weakness.     Coordination: Coordination is intact. Coordination normal.  Gait: Gait is intact. Gait normal.  Psychiatric:        Attention and Perception: Attention and perception normal.        Mood and Affect: Mood and affect normal.        Speech: Speech normal.        Behavior: Behavior normal. Behavior is cooperative.        Thought Content: Thought content normal.        Cognition and Memory: Cognition and memory normal.        Judgment:  Judgment normal.    Last depression screening scores PHQ 2/9 Scores 03/11/2021 03/12/2020 03/17/2019  PHQ - 2 Score 2 2 1   PHQ- 9 Score 9 7 6    Last fall risk screening Fall Risk  03/12/2020  Falls in the past year? 0  Number falls in past yr: 0  Injury with Fall? 0  Risk for fall due to : No Fall Risks  Follow up Falls evaluation completed   Last Audit-C alcohol use screening Alcohol Use Disorder Test (AUDIT) 03/11/2021  1. How often do you have a drink containing alcohol? 2  2. How many drinks containing alcohol do you have on a typical day when you are drinking? 0  3. How often do you have six or more drinks on one occasion? 0  AUDIT-C Score 2  Alcohol Brief Interventions/Follow-up -   A score of 3 or more in women, and 4 or more in men indicates increased risk for alcohol abuse, EXCEPT if all of the points are from question 1   No results found for any visits on 03/11/21.  Assessment & Plan    Routine Health Maintenance and Physical Exam  Exercise Activities and Dietary recommendations  Goals   None     Immunization History  Administered Date(s) Administered   PFIZER(Purple Top)SARS-COV-2 Vaccination 06/19/2019, 07/15/2019, 01/27/2020   Td 06/28/2010   Tdap 06/28/2010    Health Maintenance  Topic Date Due   Zoster Vaccines- Shingrix (1 of 2) Never done   COVID-19 Vaccine (4 - Booster for Pfizer series) 03/23/2020   TETANUS/TDAP  06/27/2020   INFLUENZA VACCINE  Never done   MAMMOGRAM  07/23/2021   PAP SMEAR-Modifier  04/28/2022   COLONOSCOPY (Pts 45-2yrs Insurance coverage will need to be confirmed)  03/30/2023   Hepatitis C Screening  Completed   HIV Screening  Completed   Pneumococcal Vaccine 23-19 Years old  Aged Out   HPV VACCINES  Aged Out    Discussed health benefits of physical activity, and encouraged her to engage in regular exercise appropriate for her age and condition.    Return in about 1 year (around 03/11/2022) for annual examination.      Vonna Kotyk, FNP, have reviewed all documentation for this visit. The documentation on 03/11/21 for the exam, diagnosis, procedures, and orders are all accurate and complete.    Gwyneth Sprout, [place] [phone] (phone) [phone] (fax)  [place]

## 2021-03-11 NOTE — Assessment & Plan Note (Signed)
BMI 33.74 Wishes to try phentermine again to jump start weight loss 90 day supply provided Discussed importance of healthy weight management Discussed diet and exercise

## 2021-03-11 NOTE — Assessment & Plan Note (Signed)
Pulled for medication refill Stable per pt report

## 2021-03-11 NOTE — Assessment & Plan Note (Signed)
Repeat lipid panel ?

## 2021-03-11 NOTE — Assessment & Plan Note (Signed)
Due next year normal exam

## 2021-03-11 NOTE — Assessment & Plan Note (Signed)
Weight loss on exam; pt unaware Has been coaching vball and playing pickleball Repeat a1c

## 2021-03-11 NOTE — Assessment & Plan Note (Signed)
Check chemistry given pre-diabetes with elevated glucose

## 2021-03-12 LAB — LIPID PANEL
Chol/HDL Ratio: 4.4 ratio (ref 0.0–4.4)
Cholesterol, Total: 252 mg/dL — ABNORMAL HIGH (ref 100–199)
HDL: 57 mg/dL (ref >39)
LDL Chol Calc (NIH): 176 mg/dL — ABNORMAL HIGH (ref 0–99)
Triglycerides: 106 mg/dL (ref 0–149)
VLDL Cholesterol Cal: 19 mg/dL (ref 5–40)

## 2021-03-12 LAB — COMPREHENSIVE METABOLIC PANEL
ALT: 21 IU/L (ref 0–32)
AST: 18 IU/L (ref 0–40)
Albumin/Globulin Ratio: 2.2 (ref 1.2–2.2)
Albumin: 4.7 g/dL (ref 3.8–4.9)
Alkaline Phosphatase: 79 IU/L (ref 44–121)
BUN/Creatinine Ratio: 16 (ref 9–23)
BUN: 15 mg/dL (ref 6–24)
Bilirubin Total: 0.3 mg/dL (ref 0.0–1.2)
CO2: 22 mmol/L (ref 20–29)
Calcium: 9.7 mg/dL (ref 8.7–10.2)
Chloride: 102 mmol/L (ref 96–106)
Creatinine, Ser: 0.95 mg/dL (ref 0.57–1.00)
Globulin, Total: 2.1 g/dL (ref 1.5–4.5)
Glucose: 97 mg/dL (ref 70–99)
Potassium: 5.1 mmol/L (ref 3.5–5.2)
Sodium: 139 mmol/L (ref 134–144)
Total Protein: 6.8 g/dL (ref 6.0–8.5)
eGFR: 72 mL/min/{1.73_m2} (ref >59)

## 2021-03-12 LAB — HEMOGLOBIN A1C
Est. average glucose Bld gHb Est-mCnc: 117 mg/dL
Hgb A1c MFr Bld: 5.7 % — ABNORMAL HIGH (ref 4.8–5.6)

## 2021-03-14 ENCOUNTER — Encounter: Payer: Self-pay | Admitting: Family Medicine

## 2021-04-07 ENCOUNTER — Encounter: Payer: Self-pay | Admitting: Family Medicine

## 2021-04-08 MED ORDER — SEMAGLUTIDE (1 MG/DOSE) 4 MG/3ML ~~LOC~~ SOPN: 1.0000 mg | PEN_INJECTOR | SUBCUTANEOUS | 1 refills | Status: DC

## 2021-05-04 ENCOUNTER — Other Ambulatory Visit: Payer: Self-pay | Admitting: Family Medicine

## 2021-05-04 MED ORDER — SEMAGLUTIDE (2 MG/DOSE) 8 MG/3ML ~~LOC~~ SOPN: 2.0000 mg | PEN_INJECTOR | SUBCUTANEOUS | 0 refills | Status: DC

## 2021-07-19 ENCOUNTER — Other Ambulatory Visit: Payer: Self-pay | Admitting: Family Medicine

## 2021-07-19 ENCOUNTER — Encounter: Payer: Self-pay | Admitting: Family Medicine

## 2021-07-19 DIAGNOSIS — R7303 Prediabetes: Secondary | ICD-10-CM

## 2021-07-19 DIAGNOSIS — R7309 Other abnormal glucose: Secondary | ICD-10-CM

## 2021-07-19 DIAGNOSIS — E669 Obesity, unspecified: Secondary | ICD-10-CM

## 2021-07-19 MED ORDER — SEMAGLUTIDE (2 MG/DOSE) 8 MG/3ML ~~LOC~~ SOPN: 2.0000 mg | PEN_INJECTOR | SUBCUTANEOUS | 1 refills | Status: DC

## 2021-09-26 ENCOUNTER — Ambulatory Visit
Admission: RE | Admit: 2021-09-26 | Discharge: 2021-09-26 | Disposition: A | Discharge status: Home / Self Care | Payer: BC Managed Care – PPO | Source: Ambulatory Visit | Attending: Family Medicine | Admitting: Family Medicine

## 2021-09-26 DIAGNOSIS — Z1231 Encounter for screening mammogram for malignant neoplasm of breast: Secondary | ICD-10-CM | POA: Diagnosis present

## 2021-10-03 ENCOUNTER — Encounter: Payer: Self-pay | Admitting: Family Medicine

## 2021-10-03 ENCOUNTER — Other Ambulatory Visit: Payer: Self-pay | Admitting: Family Medicine

## 2021-10-03 DIAGNOSIS — E669 Obesity, unspecified: Secondary | ICD-10-CM

## 2021-10-03 DIAGNOSIS — R7309 Other abnormal glucose: Secondary | ICD-10-CM

## 2021-10-03 DIAGNOSIS — R7303 Prediabetes: Secondary | ICD-10-CM

## 2021-10-03 MED ORDER — SEMAGLUTIDE (2 MG/DOSE) 8 MG/3ML ~~LOC~~ SOPN: 2.0000 mg | PEN_INJECTOR | SUBCUTANEOUS | 1 refills | Status: DC

## 2021-10-04 ENCOUNTER — Other Ambulatory Visit: Payer: Self-pay | Admitting: Family Medicine

## 2021-11-08 ENCOUNTER — Other Ambulatory Visit: Payer: Self-pay | Admitting: Family Medicine

## 2021-11-08 DIAGNOSIS — Z8619 Personal history of other infectious and parasitic diseases: Secondary | ICD-10-CM

## 2022-01-01 ENCOUNTER — Other Ambulatory Visit: Payer: Self-pay | Admitting: Family Medicine

## 2022-01-01 DIAGNOSIS — F339 Major depressive disorder, recurrent, unspecified: Secondary | ICD-10-CM

## 2022-03-09 ENCOUNTER — Other Ambulatory Visit: Payer: Self-pay | Admitting: Family Medicine

## 2022-03-09 DIAGNOSIS — F339 Major depressive disorder, recurrent, unspecified: Secondary | ICD-10-CM

## 2022-03-09 DIAGNOSIS — J301 Allergic rhinitis due to pollen: Secondary | ICD-10-CM

## 2022-03-09 NOTE — Telephone Encounter (Signed)
Requested medication (s) are due for refill today:   Yes  Requested medication (s) are on the active medication list:   Yes  Future visit scheduled:   Yes 03/14/2022 for physical with Letitia Libra   Last ordered: 03/11/2021 #90, 3 refills  Unable to refill due to protocol indicating invalid encounter within the last 12 months.     Requested Prescriptions  Pending Prescriptions Disp Refills   montelukast (SINGULAIR) 10 MG tablet [Pharmacy Med Name: MONTELUKAST SOD 10 MG TABLET] 90 tablet 3    Sig: TAKE 1 TABLET BY MOUTH EVERYDAY AT BEDTIME     Pulmonology:  Leukotriene Inhibitors Failed - 03/09/2022  2:13 AM      Failed - Valid encounter within last 12 months    Recent Outpatient Visits           12 months ago Annual physical exam   [facility] Gwyneth Sprout, FNP   1 year ago Annual physical exam   [place], Clearnce Sorrel, [place]   2 years ago Acute non-recurrent pansinusitis   [facility] [place], Clearnce Sorrel, [place]   2 years ago Annual physical exam   [place], [place]   3 years ago Possible urinary tract infection   [place], Wendee Beavers, [place]       Future Appointments             In 5 days Mardene Speak, PA-C [employer] [employer], [place]

## 2022-03-09 NOTE — Telephone Encounter (Signed)
Requested Prescriptions  Pending Prescriptions Disp Refills   escitalopram (LEXAPRO) 20 MG tablet [Pharmacy Med Name: ESCITALOPRAM 20 MG TABLET] 30 tablet 0    Sig: TAKE 1 TABLET BY MOUTH EVERY DAY     Psychiatry:  Antidepressants - SSRI Failed - 03/09/2022  3:40 PM      Failed - Completed PHQ-2 or PHQ-9 in the last 360 days      Failed - Valid encounter within last 6 months    Recent Outpatient Visits           12 months ago Annual physical exam   [facility] Gwyneth Sprout, FNP   1 year ago Annual physical exam   [place], Clearnce Sorrel, [place]   2 years ago Acute non-recurrent pansinusitis   [facility] [place], Clearnce Sorrel, [place]   2 years ago Annual physical exam   [place], [place]   3 years ago Possible urinary tract infection   [place], Wendee Beavers, [place]       Future Appointments             In 5 days Mardene Speak, PA-C [employer], [place]

## 2022-03-14 ENCOUNTER — Encounter: Payer: BC Managed Care – PPO | Admitting: Physician Assistant

## 2022-03-14 ENCOUNTER — Other Ambulatory Visit: Payer: Self-pay | Admitting: Family Medicine

## 2022-03-14 DIAGNOSIS — R7309 Other abnormal glucose: Secondary | ICD-10-CM

## 2022-03-14 DIAGNOSIS — E669 Obesity, unspecified: Secondary | ICD-10-CM

## 2022-03-14 DIAGNOSIS — R7303 Prediabetes: Secondary | ICD-10-CM

## 2022-03-14 MED ORDER — SEMAGLUTIDE-WEIGHT MANAGEMENT 2.4 MG/0.75ML ~~LOC~~ SOAJ: 2.4000 mg | SUBCUTANEOUS | 4 refills | Status: DC

## 2022-03-14 NOTE — Progress Notes (Deleted)
I,Jody Nolan Kassidy Frankson,acting as a Education administrator for [employer], PA-C.,have documented all relevant documentation on the behalf of Jody Speak, PA-C,as directed by  [employer], PA-C while in the presence of [employer], PA-C.    Complete physical exam   Patient: Jody Nolan   DOB: [birth year]   56 y.o. Female  MRN: [id number] Visit Date: 03/14/2022  Today'Nolan healthcare provider: Mardene Speak, PA-C   No chief complaint on file.  Subjective    Jody Nolan is a 56 y.o. female who presents today for a complete physical exam.  She reports consuming a {diet types:17450} diet. {Exercise:19826} She generally feels {well/fairly well/poorly:18703}. She reports sleeping {well/fairly well/poorly:18703}. She {does/does not:200015} have additional problems to discuss today.  HPI    Past Medical History:  Diagnosis Date   Allergic rhinitis    Degeneration of cervical intervertebral disc    DUB (dysfunctional uterine bleeding)    Family history of adverse reaction to anesthesia    Mothr - PONV   Family history of colon cancer    History of abnormal cervical Pap smear    History of chicken pox    Radiculopathy    Reaction, stress, acute    Shoulder pain    Past Surgical History:  Procedure Laterality Date   ABDOMINAL HYSTERECTOMY     partial   CESAREAN SECTION  2000   COLONOSCOPY WITH PROPOFOL N/A 03/29/2018   Procedure: COLONOSCOPY WITH BIOPSY;  Surgeon: [name], MD;  Location: [place];  Service: Endoscopy;  Laterality: N/A;   LEEP  1990'Nolan   POLYPECTOMY N/A 03/29/2018   Procedure: POLYPECTOMY;  Surgeon: [name], MD;  Location: [place];  Service: Endoscopy;  Laterality: N/A;   Social History   Socioeconomic History   Marital status: Married    Spouse name: Not on file   Number of children: 2   Years of education: College Grad   Highest education level: Not on file  Occupational History   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  Tobacco Use   Smoking status: Never   Smokeless tobacco: Never  Vaping Use   Vaping Use: Never used  Substance and Sexual Activity   Alcohol use: Yes    Alcohol/week: 1.0 standard drink of alcohol    Types: 1 Standard drinks or equivalent per week   Drug use: No   Sexual activity: Yes  Other Topics Concern   Not on file  Social History Narrative   Not on file   Social Determinants of Health   Financial Resource Strain: Not on file  Food Insecurity: Not on file  Transportation Needs: Not on file  Physical Activity: Not on file  Stress: Not on file  Social Connections: Not on file  Intimate Partner Violence: Not on file   Family Status  Relation Name Status   Mother  Alive   Father  Deceased at age 14       colon cancer   Brother  Alive   Daughter  Alive   Son  [place], age 100y   [place], age 89y   Mat 15  (Not Specified)   Family History  Problem Relation Age of Onset   Breast cancer Mother 71   Colon cancer Father    Hypertension Brother    Stroke Paternal Grandmother    Hypertension Paternal Grandmother    Breast cancer Maternal Aunt 42  Allergies  Allergen Reactions   Adhesive [Tape] Rash    Bandaids cause rash    Patient Care Team: Gwyneth Sprout, FNP as PCP - General (Family Medicine)   Medications: Outpatient Medications Prior to Visit  Medication Sig   ALPRAZolam (XANAX) 0.25 MG tablet Take 1 tablet (0.25 mg total) by mouth daily.   escitalopram (LEXAPRO) 20 MG tablet TAKE 1 TABLET BY MOUTH EVERY DAY   fluticasone (FLONASE) 50 MCG/ACT nasal spray Place 2 sprays into both nostrils in the morning and at bedtime.   montelukast (SINGULAIR) 10 MG tablet TAKE 1 TABLET BY MOUTH EVERYDAY AT BEDTIME   phentermine 37.5 MG capsule TAKE 1 CAPSULE BY MOUTH EVERY MORNING   Semaglutide, 2 MG/DOSE, 8 MG/3ML SOPN Inject 2 mg into the skin once a week.   valACYclovir (VALTREX) 1000 MG tablet TAKE 2  TABLETS BY MOUTH TWICE A DAY AT ONSET OF COLD SORE   No facility-administered medications prior to visit.    Review of Systems  All other systems reviewed and are negative.   Last CBC Lab Results  Component Value Date   WBC 9.2 03/12/2020   HGB 14.6 03/12/2020   HCT 44.7 03/12/2020   MCV 89 03/12/2020   MCH 28.9 03/12/2020   RDW 12.6 03/12/2020   PLT 381 50/93/2671   Last metabolic panel Lab Results  Component Value Date   GLUCOSE 97 03/11/2021   NA 139 03/11/2021   K 5.1 03/11/2021   CL 102 03/11/2021   CO2 22 03/11/2021   BUN 15 03/11/2021   CREATININE 0.95 03/11/2021   EGFR 72 03/11/2021   CALCIUM 9.7 03/11/2021   PROT 6.8 03/11/2021   ALBUMIN 4.7 03/11/2021   LABGLOB 2.1 03/11/2021   AGRATIO 2.2 03/11/2021   BILITOT 0.3 03/11/2021   ALKPHOS 79 03/11/2021   AST 18 03/11/2021   ALT 21 03/11/2021   ANIONGAP 6 (L) 03/14/2012   Last lipids Lab Results  Component Value Date   CHOL 252 (H) 03/11/2021   HDL 57 03/11/2021   LDLCALC 176 (H) 03/11/2021   TRIG 106 03/11/2021   CHOLHDL 4.4 03/11/2021   Last hemoglobin A1c Lab Results  Component Value Date   HGBA1C 5.7 (H) 03/11/2021   Last thyroid functions Lab Results  Component Value Date   TSH 2.490 03/12/2020      Objective    There were no vitals taken for this visit. BP Readings from Last 3 Encounters:  03/11/21 (!) 125/92  03/12/20 122/83  04/29/19 120/76   Wt Readings from Last 3 Encounters:  03/11/21 205 lb 14.4 oz (93.4 kg)  03/12/20 221 lb (100.2 kg)  04/29/19 203 lb (92.1 kg)       Physical Exam  ***  Last depression screening scores    03/11/2021    8:44 AM 03/12/2020    1:52 PM 03/17/2019    3:01 PM  PHQ 2/9 Scores  PHQ - 2 Score _0 PHQ- 9 Score _1 Last fall risk screening    03/12/2020    1:52 PM  [place] in the past year? 0  Number falls in past yr: 0  Injury with Fall? 0  Risk for fall due to : No Fall Risks  Follow up Falls evaluation  completed   Last Audit-C alcohol use screening    03/11/2021    8:43 AM  Alcohol Use Disorder Test (AUDIT)  1. How often do you have a drink containing  alcohol? 2  2. How many drinks containing alcohol do you have on a typical day when you are drinking? 0  3. How often do you have six or more drinks on one occasion? 0  AUDIT-C Score 2   A score of 3 or more in women, and 4 or more in men indicates increased risk for alcohol abuse, EXCEPT if all of the points are from question 1   No results found for any visits on 03/14/22.  Assessment & Plan    Routine Health Maintenance and Physical Exam  Exercise Activities and Dietary recommendations  Goals   None     Immunization History  Administered Date(Nolan) Administered   PFIZER(Purple Top)SARS-COV-2 Vaccination 06/19/2019, 07/15/2019, 01/27/2020   Td 06/28/2010   Tdap 06/28/2010    Health Maintenance  Topic Date Due   Zoster Vaccines- Shingrix (1 of 2) Never done   INFLUENZA VACCINE  Never done   COVID-19 Vaccine (4 - 2023-24 season) 12/23/2021   PAP SMEAR-Modifier  04/28/2022   MAMMOGRAM  09/27/2022   COLONOSCOPY (Pts 45-34yr Insurance coverage will need to be confirmed)  03/30/2023   Hepatitis C Screening  Completed   HIV Screening  Completed   HPV VACCINES  Aged Out    Discussed health benefits of physical activity, and encouraged her to engage in regular exercise appropriate for her age and condition.  ***  No follow-ups on file.     {provider attestation***:1}   JMardene Nolan PHershal Coria BWell[phone](phone) [phone](fax)  [place]

## 2022-03-14 NOTE — Telephone Encounter (Signed)
Requested medications are due for refill today.  unsure  Requested medications are on the active medications list.  yes  Last refill. 10/03/2021 #90 1 rf  Future visit scheduled.   yes  Notes to clinic.  Pharmacy comment: Alternative Requested:PRIOR AUTH DENIED; ALTERNATIVE?     Requested Prescriptions  Pending Prescriptions Disp Refills   OZEMPIC, 2 MG/DOSE, 8 MG/3ML SOPN [Pharmacy Med Name: OZEMPIC 8 MG/3 ML (2 MG/DOSE)]  1    Sig: INJECT 2 MG INTO THE SKIN ONCE A WEEK.     Endocrinology:  Diabetes - GLP-1 Receptor Agonists - semaglutide Failed - 03/14/2022  2:12 PM      Failed - HBA1C in normal range and within 180 days    Hgb A1c MFr Bld  Date Value Ref Range Status  03/11/2021 5.7 (H) 4.8 - 5.6 % Final    Comment:             Prediabetes: 5.7 - 6.4          Diabetes: >6.4          Glycemic control for adults with diabetes: <7.0          Failed - Cr in normal range and within 360 days    Creat  Date Value Ref Range Status  03/02/2017 0.93 0.50 - 1.10 mg/dL Final   Creatinine, Ser  Date Value Ref Range Status  03/11/2021 0.95 0.57 - 1.00 mg/dL Final         Failed - Valid encounter within last 6 months    Recent Outpatient Visits           1 year ago Annual physical exam   [facility] Gwyneth Sprout, FNP   2 years ago Annual physical exam   [facility] [place], [place]   2 years ago Acute non-recurrent pansinusitis   [facility] [place], Clearnce Sorrel, [place]   2 years ago Annual physical exam   [place], [place]   3 years ago Possible urinary tract infection   [place], Wendee Beavers, [place]       Future Appointments             In 1 month [place], Letitia Libra, PA-C [employer] [employer], [place]

## 2022-03-23 ENCOUNTER — Other Ambulatory Visit: Payer: Self-pay | Admitting: Family Medicine

## 2022-03-23 DIAGNOSIS — Z8619 Personal history of other infectious and parasitic diseases: Secondary | ICD-10-CM

## 2022-03-31 ENCOUNTER — Other Ambulatory Visit: Payer: Self-pay | Admitting: Family Medicine

## 2022-03-31 DIAGNOSIS — F339 Major depressive disorder, recurrent, unspecified: Secondary | ICD-10-CM

## 2022-04-04 ENCOUNTER — Other Ambulatory Visit: Payer: Self-pay | Admitting: Family Medicine

## 2022-04-04 DIAGNOSIS — F339 Major depressive disorder, recurrent, unspecified: Secondary | ICD-10-CM

## 2022-04-04 NOTE — Telephone Encounter (Signed)
Requested Prescriptions  Pending Prescriptions Disp Refills   escitalopram (LEXAPRO) 20 MG tablet [Pharmacy Med Name: ESCITALOPRAM 20 MG TABLET] 14 tablet 0    Sig: TAKE 1 TABLET BY MOUTH EVERY DAY     Psychiatry:  Antidepressants - SSRI Failed - 04/04/2022  1:52 AM      Failed - Completed PHQ-2 or PHQ-9 in the last 360 days      Failed - Valid encounter within last 6 months    Recent Outpatient Visits           1 year ago Annual physical exam   [facility] Gwyneth Sprout, FNP   2 years ago Annual physical exam   [place], Clearnce Sorrel, [place]   2 years ago Acute non-recurrent pansinusitis   [facility] [place], Clearnce Sorrel, [place]   3 years ago Annual physical exam   [place], [place]   3 years ago Possible urinary tract infection   [place], Wendee Beavers, [place]       Future Appointments             In 1 week Mardene Speak, PA-C [employer], [place]

## 2022-04-14 ENCOUNTER — Encounter: Payer: BC Managed Care – PPO | Admitting: Physician Assistant

## 2022-05-01 ENCOUNTER — Encounter: Payer: Self-pay | Admitting: Family Medicine

## 2022-05-01 ENCOUNTER — Ambulatory Visit (INDEPENDENT_AMBULATORY_CARE_PROVIDER_SITE_OTHER): Payer: BC Managed Care – PPO | Admitting: Family Medicine

## 2022-05-01 VITALS — BP 122/89 | HR 80 | Temp 98.2°F | Resp 16 | Ht 65.5 in | Wt 172.3 lb

## 2022-05-01 DIAGNOSIS — Z1231 Encounter for screening mammogram for malignant neoplasm of breast: Secondary | ICD-10-CM

## 2022-05-01 DIAGNOSIS — Z1321 Encounter for screening for nutritional disorder: Secondary | ICD-10-CM

## 2022-05-01 DIAGNOSIS — Z8619 Personal history of other infectious and parasitic diseases: Secondary | ICD-10-CM

## 2022-05-01 DIAGNOSIS — N951 Menopausal and female climacteric states: Secondary | ICD-10-CM

## 2022-05-01 DIAGNOSIS — Z0000 Encounter for general adult medical examination without abnormal findings: Secondary | ICD-10-CM

## 2022-05-01 DIAGNOSIS — Z5320 Procedure and treatment not carried out because of patient's decision for unspecified reasons: Secondary | ICD-10-CM | POA: Insufficient documentation

## 2022-05-01 DIAGNOSIS — F419 Anxiety disorder, unspecified: Secondary | ICD-10-CM | POA: Diagnosis not present

## 2022-05-01 DIAGNOSIS — R7303 Prediabetes: Secondary | ICD-10-CM

## 2022-05-01 DIAGNOSIS — Z6828 Body mass index (BMI) 28.0-28.9, adult: Secondary | ICD-10-CM | POA: Insufficient documentation

## 2022-05-01 DIAGNOSIS — E663 Overweight: Secondary | ICD-10-CM

## 2022-05-01 DIAGNOSIS — F32A Depression, unspecified: Secondary | ICD-10-CM

## 2022-05-01 MED ORDER — VALACYCLOVIR HCL 1 G PO TABS: 1000.0000 mg | ORAL_TABLET | Freq: Two times a day (BID) | ORAL | 1 refills | Status: DC

## 2022-05-01 MED ORDER — ALPRAZOLAM 0.25 MG PO TABS: 0.2500 mg | ORAL_TABLET | Freq: Every day | ORAL | 1 refills | Status: DC

## 2022-05-01 MED ORDER — PHENTERMINE HCL 37.5 MG PO CAPS
37.5000 mg | ORAL_CAPSULE | Freq: Every morning | ORAL | 1 refills | Status: DC
Start: 2022-05-01 — End: 2022-11-03

## 2022-05-01 NOTE — Assessment & Plan Note (Signed)
UTD on dental; due for vision Declines PAP UTD on mammo and colon Things to do to keep yourself healthy  - Exercise at least 30-45 minutes a day, 3-4 days a week.  - Eat a low-fat diet with lots of fruits and vegetables, up to 7-9 servings per day.  - Seatbelts can save your life. Wear them always.  - Smoke detectors on every level of your home, check batteries every year.  - Eye Doctor - have an eye exam every 1-2 years  - Safe sex - if you may be exposed to STDs, use a condom.  - Alcohol -  If you drink, do it moderately, less than 2 drinks per day.  - [place]. Choose someone to speak for you if you are not able.  - Depression is common in our stressful world.If you're feeling down or losing interest in things you normally enjoy, please come in for a visit.  - Violence - If anyone is threatening or hurting you, please call immediately.

## 2022-05-01 NOTE — Assessment & Plan Note (Signed)
Chronic, improving with use of phentermine 37.5 as well as Wegovy 2.4 (previously on Ozempic '2mg'$ )

## 2022-05-01 NOTE — Assessment & Plan Note (Signed)
Hx of LEEP and LSIL; declines PAP again today

## 2022-05-01 NOTE — Assessment & Plan Note (Signed)
Chronic, previously elevated Working on healthy diet and exercise Repeat A1c Continue to recommend balanced, lower carb meals. Smaller meal size, adding snacks. Choosing water as drink of choice and increasing purposeful exercise.

## 2022-05-01 NOTE — Assessment & Plan Note (Signed)
Due for repeat mammogram 09/2022

## 2022-05-01 NOTE — Progress Notes (Signed)
I,Joseline E Rosas,acting as a scribe for Jody Sprout, FNP.,have documented all relevant documentation on the behalf of Jody Sprout, FNP,as directed by  Jody Sprout, FNP while in the presence of Jody Sprout, FNP.   Complete physical exam  Patient: Jody Nolan   DOB: [birth year]   57 y.o. Female  MRN: [id number] Visit Date: 05/01/2022  Today's healthcare provider: Gwyneth Sprout, FNP  Re-Introduced to nurse practitioner role and practice setting.  All questions answered.  Discussed provider/patient relationship and expectations.  Chief Complaint  Patient presents with   Annual Exam   Subjective    Jody Nolan is a 57 y.o. female who presents today for a complete physical exam.  She reports consuming a  Lean and Green  diet. Home exercise routine includes riding bicycle and walk pilate's. She generally feels well. She reports sleeping well. She does not have additional problems to discuss today.   HPI  Per patient UTD on tetanus.  Past Medical History:  Diagnosis Date   Allergic rhinitis    Degeneration of cervical intervertebral disc    DUB (dysfunctional uterine bleeding)    Family history of adverse reaction to anesthesia    Jody Nolan - PONV   Family history of colon cancer    History of abnormal cervical Pap smear    History of chicken pox    Radiculopathy    Reaction, stress, acute    Shoulder pain    Past Surgical History:  Procedure Laterality Date   ABDOMINAL HYSTERECTOMY     partial   CESAREAN SECTION  2000   COLONOSCOPY WITH PROPOFOL N/A 03/29/2018   Procedure: COLONOSCOPY WITH BIOPSY;  Surgeon: [name], MD;  Location: [place];  Service: Endoscopy;  Laterality: N/A;   LEEP  1990's   POLYPECTOMY N/A 03/29/2018   Procedure: POLYPECTOMY;  Surgeon: [name], MD;  Location: [place];  Service: Endoscopy;  Laterality: N/A;   Social History   Socioeconomic History   Marital status: Married    Spouse name: Not on file   Number  of children: 2   Years of education: College Grad   Highest education level: Not on file  Occupational History   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  Tobacco Use   Smoking status: Never   Smokeless tobacco: Never  Vaping Use   Vaping Use: Never used  Substance and Sexual Activity   Alcohol use: Yes    Alcohol/week: 1.0 standard drink of alcohol    Types: 1 Standard drinks or equivalent per week   Drug use: No   Sexual activity: Yes  Other Topics Concern   Not on file  Social History Narrative   Not on file   Social Determinants of Health   Financial Resource Strain: Not on file  Food Insecurity: Not on file  Transportation Needs: Not on file  Physical Activity: Not on file  Stress: Not on file  Social Connections: Not on file  Intimate Partner Violence: Not on file   Family Status  Relation Name Status   Mother  Alive   Father  Deceased at age 73       colon cancer   Brother  Alive   Daughter  Alive   Son  [place], age 100y   [place], age 24y   Mat 7  (Not Specified)   Family History  Problem Relation  Age of Onset   Breast cancer Mother 59   Colon cancer Father    Hypertension Brother    Stroke Paternal Grandmother    Hypertension Paternal Grandmother    Breast cancer Maternal Aunt 42   Allergies  Allergen Reactions   Adhesive [Tape] Rash    Bandaids cause rash    Patient Care Team: Jody Sprout, FNP as PCP - General (Family Medicine)   Medications: Outpatient Medications Prior to Visit  Medication Sig   escitalopram (LEXAPRO) 20 MG tablet TAKE 1 TABLET BY MOUTH EVERY DAY (Patient taking differently: Take 20 mg by mouth daily. Patient is taking 10 mg)   fluticasone (FLONASE) 50 MCG/ACT nasal spray Place 2 sprays into both nostrils in the morning and at bedtime.   montelukast (SINGULAIR) 10 MG tablet TAKE 1 TABLET BY MOUTH EVERYDAY AT BEDTIME   Semaglutide-Weight  Management 2.4 MG/0.75ML SOAJ Inject 2.4 mg into the skin once a week.   [DISCONTINUED] ALPRAZolam (XANAX) 0.25 MG tablet Take 1 tablet (0.25 mg total) by mouth daily.   [DISCONTINUED] phentermine 37.5 MG capsule TAKE 1 CAPSULE BY MOUTH EVERY MORNING   [DISCONTINUED] valACYclovir (VALTREX) 1000 MG tablet TAKE 2 TABLETS BY MOUTH TWICE A DAY AT ONSET OF COLD SORE   No facility-administered medications prior to visit.    Review of Systems  Gastrointestinal:  Positive for abdominal distention.  Psychiatric/Behavioral:  Positive for agitation and sleep disturbance. The patient is nervous/anxious.   All other systems reviewed and are negative.   Objective    BP 122/89 (BP Location: Right Arm, Patient Position: Sitting, Cuff Size: Large)   Pulse 80   Temp 98.2 F (36.8 C) (Oral)   Resp 16   Ht 5' 5.5" (1.664 m)   Wt 172 lb 4.8 oz (78.2 kg)   BMI 28.24 kg/m   Physical Exam Vitals and nursing note reviewed.  Constitutional:      General: She is awake. She is not in acute distress.    Appearance: Normal appearance. She is well-developed, well-groomed and overweight. She is not ill-appearing, toxic-appearing or diaphoretic.  HENT:     Head: Normocephalic and atraumatic.     Jaw: There is normal jaw occlusion. No trismus, tenderness, swelling or pain on movement.     Right Ear: Hearing, tympanic membrane, ear canal and external ear normal. There is no impacted cerumen.     Left Ear: Hearing, tympanic membrane, ear canal and external ear normal. There is no impacted cerumen.     Nose: Nose normal. No congestion or rhinorrhea.     Right Turbinates: Not enlarged, swollen or pale.     Left Turbinates: Not enlarged, swollen or pale.     Right Sinus: No maxillary sinus tenderness or frontal sinus tenderness.     Left Sinus: No maxillary sinus tenderness or frontal sinus tenderness.     Mouth/Throat:     Lips: Pink.     Mouth: Mucous membranes are moist. No injury.     Tongue: No lesions.      Pharynx: Oropharynx is clear. Uvula midline. No pharyngeal swelling, oropharyngeal exudate, posterior oropharyngeal erythema or uvula swelling.     Tonsils: No tonsillar exudate or tonsillar abscesses.  Eyes:     General: Lids are normal. Lids are everted, no foreign bodies appreciated. Vision grossly intact. Gaze aligned appropriately. No allergic shiner or visual field deficit.       Right eye: No discharge.        Left eye: No discharge.  Extraocular Movements: Extraocular movements intact.     Conjunctiva/sclera: Conjunctivae normal.     Right eye: Right conjunctiva is not injected. No exudate.    Left eye: Left conjunctiva is not injected. No exudate.    Pupils: Pupils are equal, round, and reactive to light.  Neck:     Thyroid: No thyroid mass, thyromegaly or thyroid tenderness.     Vascular: No carotid bruit.     Trachea: Trachea normal.  Cardiovascular:     Rate and Rhythm: Normal rate and regular rhythm.     Pulses: Normal pulses.          Carotid pulses are 2+ on the right side and 2+ on the left side.      Radial pulses are 2+ on the right side and 2+ on the left side.       Dorsalis pedis pulses are 2+ on the right side and 2+ on the left side.       Posterior tibial pulses are 2+ on the right side and 2+ on the left side.     Heart sounds: Normal heart sounds, S1 normal and S2 normal. No murmur heard.    No friction rub. No gallop.  Pulmonary:     Effort: Pulmonary effort is normal. No respiratory distress.     Breath sounds: Normal breath sounds and air entry. No stridor. No wheezing, rhonchi or rales.  Chest:     Chest wall: No tenderness.  Abdominal:     General: Abdomen is flat. Bowel sounds are normal. There is no distension.     Palpations: Abdomen is soft. There is no mass.     Tenderness: There is no abdominal tenderness. There is no right CVA tenderness, left CVA tenderness, guarding or rebound.     Hernia: No hernia is present.  Genitourinary:     Comments: Exam deferred; denies complaints Musculoskeletal:        General: No swelling, tenderness, deformity or signs of injury. Normal range of motion.     Cervical back: Full passive range of motion without pain, normal range of motion and neck supple. No edema, rigidity or tenderness. No muscular tenderness.     Right lower leg: No edema.     Left lower leg: No edema.  Lymphadenopathy:     Cervical: No cervical adenopathy.     Right cervical: No superficial, deep or posterior cervical adenopathy.    Left cervical: No superficial, deep or posterior cervical adenopathy.  Skin:    General: Skin is warm and dry.     Capillary Refill: Capillary refill takes less than 2 seconds.     Coloration: Skin is not jaundiced or pale.     Findings: No bruising, erythema, lesion or rash.  Neurological:     General: No focal deficit present.     Mental Status: She is alert and oriented to person, place, and time. Mental status is at baseline.     GCS: GCS eye subscore is 4. GCS verbal subscore is 5. GCS motor subscore is 6.     Sensory: Sensation is intact. No sensory deficit.     Motor: Motor function is intact. No weakness.     Coordination: Coordination is intact. Coordination normal.     Gait: Gait is intact. Gait normal.  Psychiatric:        Attention and Perception: Attention and perception normal.        Mood and Affect: Mood and affect normal.  Speech: Speech normal.        Behavior: Behavior normal. Behavior is cooperative.        Thought Content: Thought content normal.        Cognition and Memory: Cognition and memory normal.        Judgment: Judgment normal.   Last depression screening scores    05/01/2022    2:20 PM 03/11/2021    8:44 AM 03/12/2020    1:52 PM  PHQ 2/9 Scores  PHQ - 2 Score 0 2 2  PHQ- 9 Score '3 9 7   '$ Last fall risk screening    05/01/2022    2:20 PM  [place] in the past year? 0  Number falls in past yr: 0  Injury with Fall? 0  Risk for  fall due to : No Fall Risks   Last Audit-C alcohol use screening    03/11/2021    8:43 AM  Alcohol Use Disorder Test (AUDIT)  1. How often do you have a drink containing alcohol? 2  2. How many drinks containing alcohol do you have on a typical day when you are drinking? 0  3. How often do you have six or more drinks on one occasion? 0  AUDIT-C Score 2   A score of 3 or more in women, and 4 or more in men indicates increased risk for alcohol abuse, EXCEPT if all of the points are from question 1   No results found for any visits on 05/01/22.  Assessment & Plan    Routine Health Maintenance and Physical Exam  Exercise Activities and Dietary recommendations  Goals   None     Immunization History  Administered Date(s) Administered   PFIZER(Purple Top)SARS-COV-2 Vaccination 06/19/2019, 07/15/2019, 01/27/2020   Td 06/28/2010   Tdap 06/28/2010    Health Maintenance  Topic Date Due   PAP SMEAR-Modifier  04/28/2022   COVID-19 Vaccine (4 - 2023-24 season) 05/17/2022 (Originally 12/23/2021)   INFLUENZA VACCINE  07/23/2022 (Originally 11/22/2021)   Zoster Vaccines- Shingrix (1 of 2) 07/31/2022 (Originally 10/20/2017)   MAMMOGRAM  09/27/2022   COLONOSCOPY (Pts 45-58yr Insurance coverage will need to be confirmed)  03/30/2023   Hepatitis C Screening  Completed   HIV Screening  Completed   HPV VACCINES  Aged Out   DTaP/Tdap/Td  Discontinued    Discussed health benefits of physical activity, and encouraged her to engage in regular exercise appropriate for her age and condition.  Problem List Items Addressed This Visit       Other   Annual physical exam - Primary    UTD on dental; due for vision Declines PAP UTD on mammo and colon Things to do to keep yourself healthy  - Exercise at least 30-45 minutes a day, 3-4 days a week.  - Eat a low-fat diet with lots of fruits and vegetables, up to 7-9 servings per day.  - Seatbelts can save your life. Wear them always.  - Smoke  detectors on every level of your home, check batteries every year.  - Eye Doctor - have an eye exam every 1-2 years  - Safe sex - if you may be exposed to STDs, use a condom.  - Alcohol -  If you drink, do it moderately, less than 2 drinks per day.  - [place] Choose someone to speak for you if you are not able.  - Depression is common in our stressful world.If you're feeling down or losing interest  in things you normally enjoy, please come in for a visit.  - Violence - If anyone is threatening or hurting you, please call immediately.       Relevant Orders   Comprehensive metabolic panel   Lipid panel   CBC   TSH   Anxiety and depression    Chronic, improving Focusing on self care Wishes to decrease current SSRI to 1/2 tablet (lexapro 20) to lexapro 10 Declines assistance with wean or additional refills at this time Patient is aware of risks of chronic abortive anxiety medication use to include increased sedation, respiratory suppression, falls, dependence and cardiovascular events.  Patient would like to continue treatment as benefit determined to outweigh risk.         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  Request for assistance with anti-virals during flares; denies current exacerbations       Relevant Medications   valACYclovir (VALTREX) 1000 MG tablet   Menopausal vasomotor syndrome    Wishes to complete labs to identify level of menopause; symptoms improving with continued weight loss      Relevant Orders   FSH/LH   Overweight with body mass index (BMI) of 28 to 28.9 in adult    Chronic, improving with use of phentermine 37.5 as well as Wegovy 2.4 (previously on Ozempic '2mg'$ )       Prediabetes    Chronic,  previously elevated Working on healthy diet and exercise Repeat A1c Continue to recommend balanced, lower carb meals. Smaller meal size, adding snacks. Choosing water as drink of choice and increasing purposeful exercise.       Relevant Orders   Hemoglobin A1c   Screening mammogram for breast cancer    Due for repeat mammogram 09/2022      Return in about 6 months (around 10/30/2022) for chonic disease management.    Vonna Kotyk, FNP, have reviewed all documentation for this visit. The documentation on 05/01/22 for the exam, diagnosis, procedures, and orders are all accurate and complete.  Jody Nolan, [place] [phone] (phone) [phone] (fax)  [place]

## 2022-05-01 NOTE — Assessment & Plan Note (Signed)
Acute flares; known hx since 1990s  Request for assistance with anti-virals during flares; denies current exacerbations

## 2022-05-01 NOTE — Assessment & Plan Note (Signed)
Wishes to complete labs to identify level of menopause; symptoms improving with continued weight loss

## 2022-05-01 NOTE — Assessment & Plan Note (Signed)
Chronic, improving Focusing on self care Wishes to decrease current SSRI to 1/2 tablet (lexapro 20) to lexapro 10 Declines assistance with wean or additional refills at this time Patient is aware of risks of chronic abortive anxiety medication use to include increased sedation, respiratory suppression, falls, dependence and cardiovascular events.  Patient would like to continue treatment as benefit determined to outweigh risk.

## 2022-05-02 LAB — COMPREHENSIVE METABOLIC PANEL
ALT: 12 IU/L (ref 0–32)
AST: 16 IU/L (ref 0–40)
Albumin/Globulin Ratio: 2.3 — ABNORMAL HIGH (ref 1.2–2.2)
Albumin: 4.9 g/dL (ref 3.8–4.9)
Alkaline Phosphatase: 60 IU/L (ref 44–121)
BUN/Creatinine Ratio: 20 (ref 9–23)
BUN: 17 mg/dL (ref 6–24)
Bilirubin Total: 0.4 mg/dL (ref 0.0–1.2)
CO2: 23 mmol/L (ref 20–29)
Calcium: 10.1 mg/dL (ref 8.7–10.2)
Chloride: 102 mmol/L (ref 96–106)
Creatinine, Ser: 0.85 mg/dL (ref 0.57–1.00)
Globulin, Total: 2.1 g/dL (ref 1.5–4.5)
Glucose: 81 mg/dL (ref 70–99)
Potassium: 4.8 mmol/L (ref 3.5–5.2)
Sodium: 143 mmol/L (ref 134–144)
Total Protein: 7 g/dL (ref 6.0–8.5)
eGFR: 81 mL/min/{1.73_m2} (ref >59)

## 2022-05-02 LAB — LIPID PANEL
Chol/HDL Ratio: 2.4 ratio (ref 0.0–4.4)
Cholesterol, Total: 239 mg/dL — ABNORMAL HIGH (ref 100–199)
HDL: 98 mg/dL (ref >39)
LDL Chol Calc (NIH): 130 mg/dL — ABNORMAL HIGH (ref 0–99)
Triglycerides: 66 mg/dL (ref 0–149)
VLDL Cholesterol Cal: 11 mg/dL (ref 5–40)

## 2022-05-02 LAB — CBC
Hematocrit: 44.6 % (ref 34.0–46.6)
Hemoglobin: 15.2 g/dL (ref 11.1–15.9)
MCH: 30.7 pg (ref 26.6–33.0)
MCHC: 34.1 g/dL (ref 31.5–35.7)
MCV: 90 fL (ref 79–97)
Platelets: 354 10*3/uL (ref 150–450)
RBC: 4.95 x10E6/uL (ref 3.77–5.28)
RDW: 12.4 % (ref 11.7–15.4)
WBC: 9.5 10*3/uL (ref 3.4–10.8)

## 2022-05-02 LAB — VITAMIN D 25 HYDROXY (VIT D DEFICIENCY, FRACTURES): Vit D, 25-Hydroxy: 39.9 ng/mL (ref 30.0–100.0)

## 2022-05-02 LAB — FSH/LH
FSH: 69.4 m[IU]/mL
LH: 36.9 m[IU]/mL

## 2022-05-02 LAB — HEMOGLOBIN A1C
Est. average glucose Bld gHb Est-mCnc: 117 mg/dL
Hgb A1c MFr Bld: 5.7 % — ABNORMAL HIGH (ref 4.8–5.6)

## 2022-05-02 LAB — TSH: TSH: 3.3 u[IU]/mL (ref 0.450–4.500)

## 2022-05-02 LAB — B12 AND FOLATE PANEL
Folate: 12.3 ng/mL (ref >3.0)
Vitamin B-12: 673 pg/mL (ref 232–1245)

## 2022-05-02 NOTE — Progress Notes (Signed)
Cholesterol remains elevated; LDL remains elevated. recommend diet low in saturated fat and regular exercise - 30 min at least 5 times per week The 10-year ASCVD risk score (Arnett DK, et al., 2019) is: 1.1%   Values used to calculate the score:     Age: 57 years     Sex: Female     Is Non-Hispanic African American: No     Diabetic: No     Tobacco smoker: No     Systolic Blood Pressure: 443 mmHg     Is BP treated: No     HDL Cholesterol: 98 mg/dL     Total Cholesterol: 239 mg/dL A1c continues to show pre-diabetes.

## 2022-05-02 NOTE — Progress Notes (Signed)
[place] confirms post-menopausal; at 70 mIU/mL (normal is <20).

## 2022-05-09 ENCOUNTER — Encounter: Payer: BC Managed Care – PPO | Admitting: Physician Assistant

## 2022-08-07 ENCOUNTER — Other Ambulatory Visit: Payer: Self-pay | Admitting: Family Medicine

## 2022-08-07 DIAGNOSIS — Z8619 Personal history of other infectious and parasitic diseases: Secondary | ICD-10-CM

## 2022-08-08 NOTE — Telephone Encounter (Signed)
Requested Prescriptions  Pending Prescriptions Disp Refills   valACYclovir (VALTREX) 1000 MG tablet [Pharmacy Med Name: VALACYCLOVIR HCL 1 GRAM TABLET] 20 tablet 1    Sig: TAKE 1 TABLET (1,000 MG TOTAL) BY MOUTH 2 (TWO) TIMES DAILY. TAKE 2 TABLETS TWICE DAILY FOR 5-7 DAYS FOR CURRENT FLARE     Antimicrobials:  Antiviral Agents - Anti-Herpetic Passed - 08/07/2022  9:44 AM      Passed - Valid encounter within last 12 months    Recent Outpatient Visits           3 months ago Annual physical exam   [facility] Jacky Kindle, FNP   1 year ago Annual physical exam   [facility] Jacky Kindle, FNP   2 years ago Annual physical exam   [facility] [place], Alessandra Bevels, [place]   2 years ago Acute non-recurrent pansinusitis   [facility] [place], Alessandra Bevels, [place]   3 years ago Annual physical exam   [facility] Joycelyn [place], [place]

## 2022-11-01 ENCOUNTER — Emergency Department: Payer: BC Managed Care – PPO

## 2022-11-01 ENCOUNTER — Other Ambulatory Visit: Payer: Self-pay | Admitting: Family Medicine

## 2022-11-01 ENCOUNTER — Ambulatory Visit: Payer: Self-pay

## 2022-11-01 ENCOUNTER — Other Ambulatory Visit: Payer: Self-pay

## 2022-11-01 ENCOUNTER — Encounter: Payer: Self-pay | Admitting: Emergency Medicine

## 2022-11-01 ENCOUNTER — Inpatient Hospital Stay
Admission: EM | Admit: 2022-11-01 | Discharge: 2022-11-03 | DRG: 070 | Disposition: A | Discharge status: Home / Self Care | Payer: BC Managed Care – PPO | Attending: Internal Medicine | Admitting: Internal Medicine

## 2022-11-01 DIAGNOSIS — W57XXXD Bitten or stung by nonvenomous insect and other nonvenomous arthropods, subsequent encounter: Secondary | ICD-10-CM

## 2022-11-01 DIAGNOSIS — R410 Disorientation, unspecified: Secondary | ICD-10-CM

## 2022-11-01 DIAGNOSIS — Z823 Family history of stroke: Secondary | ICD-10-CM

## 2022-11-01 DIAGNOSIS — G9341 Metabolic encephalopathy: Principal | ICD-10-CM | POA: Diagnosis present

## 2022-11-01 DIAGNOSIS — E663 Overweight: Secondary | ICD-10-CM | POA: Diagnosis present

## 2022-11-01 DIAGNOSIS — Z8249 Family history of ischemic heart disease and other diseases of the circulatory system: Secondary | ICD-10-CM

## 2022-11-01 DIAGNOSIS — Z7985 Long-term (current) use of injectable non-insulin antidiabetic drugs: Secondary | ICD-10-CM

## 2022-11-01 DIAGNOSIS — Z803 Family history of malignant neoplasm of breast: Secondary | ICD-10-CM

## 2022-11-01 DIAGNOSIS — Z6828 Body mass index (BMI) 28.0-28.9, adult: Secondary | ICD-10-CM

## 2022-11-01 DIAGNOSIS — F419 Anxiety disorder, unspecified: Secondary | ICD-10-CM | POA: Diagnosis present

## 2022-11-01 DIAGNOSIS — F32A Depression, unspecified: Secondary | ICD-10-CM | POA: Diagnosis present

## 2022-11-01 DIAGNOSIS — Z800 Family history of malignant neoplasm of digestive organs: Secondary | ICD-10-CM

## 2022-11-01 DIAGNOSIS — Z818 Family history of other mental and behavioral disorders: Secondary | ICD-10-CM

## 2022-11-01 DIAGNOSIS — G0490 Encephalitis and encephalomyelitis, unspecified: Secondary | ICD-10-CM | POA: Diagnosis present

## 2022-11-01 DIAGNOSIS — R7303 Prediabetes: Secondary | ICD-10-CM | POA: Diagnosis present

## 2022-11-01 DIAGNOSIS — Z79899 Other long term (current) drug therapy: Secondary | ICD-10-CM

## 2022-11-01 DIAGNOSIS — Z91048 Other nonmedicinal substance allergy status: Secondary | ICD-10-CM

## 2022-11-01 DIAGNOSIS — Z9183 Wandering in diseases classified elsewhere: Secondary | ICD-10-CM

## 2022-11-01 DIAGNOSIS — E876 Hypokalemia: Secondary | ICD-10-CM | POA: Diagnosis present

## 2022-11-01 DIAGNOSIS — R569 Unspecified convulsions: Secondary | ICD-10-CM | POA: Diagnosis present

## 2022-11-01 LAB — URINALYSIS, COMPLETE (UACMP) WITH MICROSCOPIC
Bacteria, UA: NONE SEEN
Bilirubin Urine: NEGATIVE
Glucose, UA: NEGATIVE mg/dL
Hgb urine dipstick: NEGATIVE
Ketones, ur: 5 mg/dL — AB
Leukocytes,Ua: NEGATIVE
Nitrite: NEGATIVE
Protein, ur: NEGATIVE mg/dL
Specific Gravity, Urine: 1.024 (ref 1.005–1.030)
Squamous Epithelial / HPF: NONE SEEN /HPF (ref 0–5)
pH: 6 (ref 5.0–8.0)

## 2022-11-01 LAB — CBC
HCT: 41.7 % (ref 36.0–46.0)
Hemoglobin: 13.7 g/dL (ref 12.0–15.0)
MCH: 29.8 pg (ref 26.0–34.0)
MCHC: 32.9 g/dL (ref 30.0–36.0)
MCV: 90.7 fL (ref 80.0–100.0)
Platelets: 280 10*3/uL (ref 150–400)
RBC: 4.6 MIL/uL (ref 3.87–5.11)
RDW: 12.7 % (ref 11.5–15.5)
WBC: 5.9 10*3/uL (ref 4.0–10.5)
nRBC: 0 % (ref 0.0–0.2)

## 2022-11-01 LAB — COMPREHENSIVE METABOLIC PANEL
ALT: 30 U/L (ref 0–44)
AST: 33 U/L (ref 15–41)
Albumin: 4.5 g/dL (ref 3.5–5.0)
Alkaline Phosphatase: 40 U/L (ref 38–126)
Anion gap: 8 (ref 5–15)
BUN: 16 mg/dL (ref 6–20)
CO2: 26 mmol/L (ref 22–32)
Calcium: 9.3 mg/dL (ref 8.9–10.3)
Chloride: 106 mmol/L (ref 98–111)
Creatinine, Ser: 0.67 mg/dL (ref 0.44–1.00)
GFR, Estimated: >60 mL/min (ref >60)
Glucose, Bld: 105 mg/dL — ABNORMAL HIGH (ref 70–99)
Potassium: 3.1 mmol/L — ABNORMAL LOW (ref 3.5–5.1)
Sodium: 140 mmol/L (ref 135–145)
Total Bilirubin: 1.1 mg/dL (ref 0.3–1.2)
Total Protein: 7.1 g/dL (ref 6.5–8.1)

## 2022-11-01 LAB — URINE DRUG SCREEN, QUALITATIVE (ARMC ONLY)
Amphetamines, Ur Screen: NOT DETECTED
Barbiturates, Ur Screen: NOT DETECTED
Benzodiazepine, Ur Scrn: NOT DETECTED
Cannabinoid 50 Ng, Ur ~~LOC~~: NOT DETECTED
Cocaine Metabolite,Ur ~~LOC~~: NOT DETECTED
MDMA (Ecstasy)Ur Screen: NOT DETECTED
Methadone Scn, Ur: NOT DETECTED
Opiate, Ur Screen: NOT DETECTED
Phencyclidine (PCP) Ur S: NOT DETECTED
Tricyclic, Ur Screen: NOT DETECTED

## 2022-11-01 LAB — SALICYLATE LEVEL: Salicylate Lvl: <7 mg/dL — ABNORMAL LOW (ref 7.0–30.0)

## 2022-11-01 LAB — ACETAMINOPHEN LEVEL: Acetaminophen (Tylenol), Serum: <10 ug/mL — ABNORMAL LOW (ref 10–30)

## 2022-11-01 LAB — ETHANOL: Alcohol, Ethyl (B): <10 mg/dL (ref <10)

## 2022-11-01 MED ORDER — LORAZEPAM 2 MG/ML IJ SOLN
2.0000 mg | Freq: Once | INTRAMUSCULAR | Status: DC
  Filled 2022-11-01: qty 1

## 2022-11-01 MED ORDER — LORAZEPAM 2 MG/ML IJ SOLN
1.0000 mg | Freq: Once | INTRAMUSCULAR | Status: DC
  Filled 2022-11-01: qty 1

## 2022-11-01 MED ORDER — GADOBUTROL 1 MMOL/ML IV SOLN
7.0000 mL | Freq: Once | INTRAVENOUS | Status: AC | PRN
  Administered 2022-11-02: 7 mL via INTRAVENOUS

## 2022-11-01 NOTE — Telephone Encounter (Unsigned)
Copied from CRM [id number]. Topic: General - Other >> Nov 01, 2022 12:54 PM Carrielelia G wrote: FYI: Husband called and wanted the triage nurse and/or office to know that his wife will be going to [facility] instead of [place].

## 2022-11-01 NOTE — Telephone Encounter (Signed)
Mr Heffern is the caller....but patient is with her mother.... He states she has been having mental changes for the last two-three weeks, now she is having difficulty saying words.  Recently she went to [facility]: [facility] and now she is with her mother in [place], [place] .Marland Kitchen She was also seen at a [facility] facility called  [facility] she went there for an evaluation.      Left message to call back.

## 2022-11-01 NOTE — BH Assessment (Addendum)
Comprehensive Clinical Assessment (CCA) Note  11/01/2022 Jody Nolan [id number]  Chief Complaint: Patient is a 57 year old female presenting to [facility] ED voluntarily. Per triage note Patient to ED via POV for AMS since this weekend. Husband reports wife was living down in [facility] and was taken to the hospital last week- he is unsure why. Family brought her home and has been confused and wondered out of the family home last PM. Patient only oriented to self. During assessment patient appears alert and oriented x2, she is irritable and guarded and has some slow movements as well as speech. When asked if the patient has ever experienced this before she is slow to respond and reports "when they wanted to take me upstairs." Patient is able to deny SI/HI  Further information was provided by the patient's husband Jody Nolan who reports that he and his wife have been legally separated and haven't been living together for 3 years. Husband reports that the wife lives in [place] and he lives here, he reports that he has never seen this behavior in the past but per the patient's neighbors that live in [place], the patient has been wandering around "talking nonsense" since a week ago. Husband reports that he did a wellness check and the patient was taken to a hospital in [facility], the patient does not recall that event happening. Husband reports that he and the patient officially told their children that they were separating 2 weeks ago and that the patient was provided legal paperwork soon after that. Husband reports that the patient has been getting combative at night, not eating and has been taking diet pills "Ozempic." Husband reports that "yesterday was a good day until about midnight when she walked out the house and was missing for 30 minutes."   Patient's mother Jody Nolan reports that the last time she saw the patient was on Mother's Day "she was uptight it wasn't a warm feeling." Mother  reports that there is no prior mental health history of the patient and there is no family history of mental health issues.   Per EDP Dr. Derrill Nolan it had been discussed that the patient was having an affair in [facility] with another man and she was recently proposed to by him.   Per Psyc NP Jody Nolan patient to be reassessed, recommending MRI and Neurology consult be placed Chief Complaint  Patient presents with   Altered Mental Status   Visit Diagnosis: Altered Mental Status    CCA Screening, Triage and Referral (STR)  Patient Reported Information How did you hear about [place]? Family/Friend  Referral name: No data recorded Referral phone number: No data recorded  Whom do you see for routine medical problems? No data recorded Practice/Facility Name: No data recorded Practice/Facility Phone Number: No data recorded Name of Contact: No data recorded Contact Number: No data recorded Contact Fax Number: No data recorded Prescriber Name: No data recorded Prescriber Address (if known): No data recorded  What Is the Reason for Your Visit/Call Today? Patient to ED via POV for AMS since this weekend. Husband reports wife was living down in [facility] and was taken to the hospital last week- he is unsure why. Family brought her home and has been confused and wondered out of the family home last PM. Patient only oriented to self.  How Long Has This Been Causing You Problems? 1 wk - 1 month  What Do You Feel Would Help You the Most Today? No data recorded  Have You Recently Been in Any Inpatient Treatment (Hospital/Detox/Crisis Center/28-Day Program)? No data recorded Name/Location of Program/Hospital:No data recorded How Long Were You There? No data recorded When Were You Discharged? No data recorded  Have You Ever Received Services From [facility] Before? No data recorded Who Do You See at [facility]? No data recorded  Have You Recently Had Any Thoughts About Hurting Yourself?  No  Are You Planning to Commit Suicide/Harm Yourself At This time? No   Have you Recently Had Thoughts About Hurting Someone Jody Nolan? No  Explanation: No data recorded  Have You Used Any Alcohol or Drugs in the Past 24 Hours? No  How Long Ago Did You Use Drugs or Alcohol? No data recorded What Did You Use and How Much? No data recorded  Do You Currently Have a Therapist/Psychiatrist? No  Name of Therapist/Psychiatrist: No data recorded  Have You Been Recently Discharged From Any Office Practice or Programs? No  Explanation of Discharge From Practice/Program: No data recorded    CCA Screening Triage Referral Assessment Type of Contact: Face-to-Face  Is this Initial or Reassessment? No data recorded Date Telepsych consult ordered in [facility]:  No data recorded Time Telepsych consult ordered in [facility]:  No data recorded  Patient Reported Information Reviewed? No data recorded Patient Left Without Being Seen? No data recorded Reason for Not Completing Assessment: No data recorded  Collateral Involvement: No data recorded  Does Patient Have a Court Appointed Legal Guardian? No data recorded Name and Contact of Legal Guardian: No data recorded If Minor and Not Living with Parent(s), Who has Custody? No data recorded Is CPS involved or ever been involved? Never  Is APS involved or ever been involved? Never   Patient Determined To Be At Risk for Harm To Self or Others Based on Review of Patient Reported Information or Presenting Complaint? No data recorded Method: No data recorded Availability of Means: No data recorded Intent: No data recorded Notification Required: No data recorded Additional Information for Danger to Others Potential: No data recorded Additional Comments for Danger to Others Potential: No data recorded Are There Guns or Other Weapons in Your Home? No  Types of Guns/Weapons: No data recorded Are These Weapons Safely Secured?                            No data  recorded Who Could Verify You Are Able To Have These Secured: No data recorded Do You Have any Outstanding Charges, Pending Court Dates, Parole/Probation? No data recorded Contacted To Inform of Risk of Harm To Self or Others: No data recorded  Location of Assessment: [facility] ED   Does Patient Present under Involuntary Commitment? No  IVC Papers Initial File Date: No data recorded  [place] of Residence: [place]   Patient Currently Receiving the Following Services: No data recorded  Determination of Need: Emergent (2 hours)   Options For Referral: No data recorded    CCA Biopsychosocial Intake/Chief Complaint:  No data recorded Current Symptoms/Problems: No data recorded  Patient Reported Schizophrenia/Schizoaffective Diagnosis in Past: No   Strengths: Patient is able to communicate, has a good support system  Preferences: No data recorded Abilities: No data recorded  Type of Services Patient Feels are Needed: No data recorded  Initial Clinical Notes/Concerns: No data recorded  Mental Health Symptoms Depression:   Irritability; Change in energy/activity   Duration of Depressive symptoms:  Greater than two weeks   Mania:   Irritability  Anxiety:    Irritability; Restlessness; Worrying   Psychosis:   Delusions   Duration of Psychotic symptoms:  Greater than six months   Trauma:   None   Obsessions:   None   Compulsions:   Absent insight/delusional; Poor Insight; Repeated behaviors/mental acts   Inattention:   None   Hyperactivity/Impulsivity:   None   Oppositional/Defiant Behaviors:   Argumentative; Easily annoyed; Temper   Emotional Irregularity:   Intense/inappropriate anger; Mood lability; Transient, stress-related paranoia/disassociation   Other Mood/Personality Symptoms:  No data recorded   Mental Status Exam Appearance and self-care  Stature:   Average   Weight:   Average weight   Clothing:   Casual   Grooming:   Normal    Cosmetic use:   None   Posture/gait:   Tense   Motor activity:   Slowed   Sensorium  Attention:   Normal   Concentration:   Preoccupied   Orientation:   Person; Place   Recall/memory:   Defective in Short-term; Defective in Recent   Affect and Mood  Affect:   Flat   Mood:   Anxious; Irritable   Relating  Eye contact:   Normal   Facial expression:   Tense   Attitude toward examiner:   Guarded; Irritable; Suspicious   Thought and Language  Speech flow:  Slow; Pressured   Thought content:   Delusions   Preoccupation:   Ruminations   Hallucinations:   None   Organization:  No data recorded  [employer] of Knowledge:   Fair   Intelligence:   Average   Abstraction:   Functional   Judgement:   Impaired   Reality Testing:   Distorted   Insight:   Lacking; None/zero insight   Decision Making:   Impulsive   Social Functioning  Social Maturity:   Impulsive   Social Judgement:   Heedless   Stress  Stressors:   Family conflict; Relationship; Transitions   Coping Ability:   Contractor Deficits:   None   Supports:   Family; Friends/Service system     Religion: Religion/Spirituality Are You A Religious Person?: No  Leisure/Recreation: Leisure / Recreation Do You Have Hobbies?: No  Exercise/Diet: Exercise/Diet Do You Exercise?: No Have You Gained or Lost A Significant Amount of Weight in the Past Six Months?: No Do You Follow a Special Diet?: No Do You Have Any Trouble Sleeping?: No   CCA Employment/Education Employment/Work Situation: Employment / Work Situation Employment Situation: Employed Patient's Job has Been Impacted by Current Illness: No Has Patient ever Been in Equities trader?: No  Education: Education Is Patient Currently Attending School?: No Did You Have An Individualized Education Program (IIEP): No Did You Have Any Difficulty At [employer]?: No Patient's Education Has Been  Impacted by Current Illness: No   CCA Family/Childhood History Family and Relationship History: Family history Marital status: Separated Separated, when?: Patient and husband have been separated for 3 years What types of issues is patient dealing with in the relationship?: Patient and the husband recently discussed and told their children that they were separtated Additional relationship information: 2 weeks ago separtation conversation happened, also per EDP Dr. Derrill Nolan patient has been having an affair and was recently proposed to by the guy she had an affair with Does patient have children?: Yes How many children?:  (Unknown) How is patient's relationship with their children?: Good relationship with children  Childhood History:  Childhood History By whom was/is the patient raised?: Both parents  Did patient suffer any verbal/emotional/physical/sexual abuse as a child?: No Did patient suffer from severe childhood neglect?: No Has patient ever been sexually abused/assaulted/raped as an adolescent or adult?: No Was the patient ever a victim of a crime or a disaster?: No Witnessed domestic violence?: No Has patient been affected by domestic violence as an adult?: No  Child/Adolescent Assessment:     CCA Substance Use Alcohol/Drug Use: Alcohol / Drug Use Pain Medications: see mar Prescriptions: see mar Over the Counter: see mar History of alcohol / drug use?: No history of alcohol / drug abuse                         ASAM's:  Six Dimensions of Multidimensional Assessment  Dimension 1:  Acute Intoxication and/or Withdrawal Potential:      Dimension 2:  Biomedical Conditions and Complications:      Dimension 3:  Emotional, Behavioral, or Cognitive Conditions and Complications:     Dimension 4:  Readiness to Change:     Dimension 5:  Relapse, Continued use, or Continued Problem Potential:     Dimension 6:  Recovery/Living Environment:     ASAM Severity Score:     ASAM Recommended Level of Treatment:     Substance use Disorder (SUD)    Recommendations for Services/Supports/Treatments:    DSM5 Diagnoses: Patient Active Problem List   Diagnosis Date Noted   Encounter for screening mammogram for malignant neoplasm of breast 05/01/2022   Menopausal vasomotor syndrome 05/01/2022   Overweight with body mass index (BMI) of 28 to 28.9 in adult 05/01/2022   Cervical cancer screening declined 05/01/2022   Screening mammogram for breast cancer 03/11/2021   Prediabetes 03/11/2021   Annual physical exam 03/11/2021   Anxiety and depression 03/11/2021   H/O cold sores 03/11/2021    Patient Centered Plan: Patient is on the following Treatment Plan(s):  Anxiety and Impulse Control   Referrals to Alternative Service(s): Referred to Alternative Service(s):   Place:   Date:   Time:    Referred to Alternative Service(s):   Place:   Date:   Time:    Referred to Alternative Service(s):   Place:   Date:   Time:    Referred to Alternative Service(s):   Place:   Date:   Time:      @[facility]LL[facility]FCARE @  [employer], LCAS-A

## 2022-11-01 NOTE — Telephone Encounter (Signed)
Message from Cory Munch sent at 11/01/2022 10:36 AM EDT  Summary: anxiety, mental changes, difficulty saying words   Mr Shurtz is the caller....but patient is with her mother.... He states she has been having mental changes for the last two-three weeks, now she is having difficulty saying words.  Recently she went to [facility] and now she is with her mother in [place], [place] .Marland Kitchen She was also seen at a [facility] facility called  [facility] she went there for an evaluation.         Chief Complaint: anxiety ental status change Symptoms: confused, not aware or person, place or time, moving very slowly and wandering off unaware of where she was going, talking slow, weight loss  Frequency: apprx. 2 weeks ago  Pertinent Negatives: Patient denies per mother pt has not mentioned suicidal ideation or plan, belongings have been searched Disposition: [x] ED /[] Urgent Care (no appt availability in office) / [] Appointment(In office/virtual)/ []  [facility]/ [] Home Care/ [] Refused Recommended Disposition /[] [facility]/ []  Follow-up with PCP Additional Notes: spoke with PCP about assessment and was advised to take to ED or [facility]. Pt's mother given address of both facilities. Advised mother that is she is uncooperative, mother will have to call 911 to get her a police escort for IVC. Pt's mother verbalized understanding.   Reason for Disposition  Very strange or paranoid behavior  Answer Assessment - Initial Assessment Questions 1. SYMPTOM: "What is the main symptom you are concerned about?" (e.g., weakness, numbness)     Acts like she is in a daze - taking longer than usual - lost weight  2. ONSET: "When did this start?" (minutes, hours, days; while sleeping)     2 weeks  3. LAST NORMAL: "When was the last time you (the patient) were normal (no symptoms)?"     3 weeks - not  4. PATTERN "Does this come and go, or has it been constant  since it started?"  "Is it present now?"     Constant  5. CARDIAC SYMPTOMS: "Have you had any of the following symptoms: chest pain, difficulty breathing, palpitations?"     *No Answer* 6. NEUROLOGIC SYMPTOMS: "Have you had any of the following symptoms: headache, dizziness, vision loss, double vision, changes in speech, unsteady on your feet?"     Poor focus  7. OTHER SYMPTOMS: "Do you have any other symptoms?"     She went out backdoor and searched for her 40 minutes then shows up- was looking for you - - losing weight - slow motion cannot think or concentrate  "Episode at beach"  Answer Assessment - Initial Assessment Questions 1. LEVEL OF CONSCIOUSNESS: "How is he (she, the patient) acting right now?" (e.g., alert-oriented, confused, lethargic, stuporous, comatose)     Not awware of person place or time, tasks taking much longer, loss of weight poor focus, wandering off  2. ONSET: "When did the confusion start?"  (minutes, hours, days)     2 weeks ago  3. PATTERN "Does this come and go, or has it been constant since it started?"  "Is it present now?"     constant 6. CAUSE: "What do you think is causing the confusion?"      unsure 7. OTHER SYMPTOMS: "Are there any other symptoms?" (e.g., difficulty breathing, headache, fever, weakness)     Weight loss, not aware of person, place or time, moving slow, confused.  Protocols used: Neurologic Deficit-A-AH, Confusion - Delirium-A-AH

## 2022-11-01 NOTE — ED Provider Notes (Signed)
[facility] Provider Note    Event Date/Time   First MD Initiated Contact with Patient 11/01/22 1607     (approximate)   History   Altered Mental Status   HPI  Yani Coventry is a 57 y.o. female who presents to the emergency department today because of concerns for altered mental status.  Patient herself is unable to give any significant history.  History is primarily obtained from husband at bedside.  [place]  Over the past couple weeks and neighbors have noticed that she has been acting somewhat strange.  She was taken to a hospital in [place] after a wellness check initiated by neighbors.  It is unclear what they found her diagnosed however they did have her follow-up at a mental health clinic the next day.  The husband then informed me outside of the patient's room that the patient has been under a lot of stress recently with a lot of life events occurring.     Physical Exam   Triage Vital Signs: ED Triage Vitals  Enc Vitals Group     BP 11/01/22 1436 (!) 144/83     Pulse Rate 11/01/22 1436 (!) 101     Resp 11/01/22 1436 18     Temp 11/01/22 1436 98.7 F (37.1 C)     Temp Source 11/01/22 1436 Oral     SpO2 11/01/22 1436 97 %     Weight --      Height --      Head Circumference --      Peak Flow --      Pain Score 11/01/22 1437 0     Pain Loc --      Pain Edu? --      Excl. in GC? --     Most recent vital signs: Vitals:   11/01/22 1436  BP: (!) 144/83  Pulse: (!) 101  Resp: 18  Temp: 98.7 F (37.1 C)  SpO2: 97%   General: Awake, alert. CV:  Good peripheral perfusion. Regular rate and rhythm. Resp:  Normal effort. Lungs clear. Abd:  No distention.    ED Results / Procedures / Treatments   Labs (all labs ordered are listed, but only abnormal results are displayed) Labs Reviewed  COMPREHENSIVE METABOLIC PANEL - Abnormal; Notable for the following components:       Result Value   Potassium 3.1 (*)    Glucose, Bld 105 (*)    All other components within normal limits  URINALYSIS, COMPLETE (UACMP) WITH MICROSCOPIC - Abnormal; Notable for the following components:   Color, Urine YELLOW (*)    APPearance CLEAR (*)    Ketones, ur 5 (*)    All other components within normal limits  ACETAMINOPHEN LEVEL - Abnormal; Notable for the following components:   Acetaminophen (Tylenol), Serum <10 (*)    All other components within normal limits  SALICYLATE LEVEL - Abnormal; Notable for the following components:   Salicylate Lvl <7.0 (*)    All other components within normal limits  CBC  ETHANOL  URINE DRUG SCREEN, QUALITATIVE ([facility] ONLY)  CBG MONITORING, ED     EKG  None   RADIOLOGY Patient refusing MRI   PROCEDURES:  Critical Care performed: No   MEDICATIONS ORDERED IN ED: Medications - No data to display   IMPRESSION / MDM / ASSESSMENT AND PLAN / ED COURSE  I reviewed the triage vital signs and the nursing  notes.                              Differential diagnosis includes, but is not limited to, psychiatric illness, intracranial process  Patient's presentation is most consistent with acute presentation with potential threat to life or bodily function.   The patient is on the cardiac monitor to evaluate for evidence of arrhythmia and/or significant heart rate changes.  Patient presented to the emergency department today because of concerns for altered and abnormal behavior.  Per husband the patient has had recently undergone significant stress.  We were able to obtain records from outside hospital which showed negative CT scan.  Had been evaluated by psychiatry at the outside hospital and follow-up with an outpatient psychiatry facility.  I had initially ordered an MRI however patient refused and at this time given normal CT scan and presentation do not feel strongly that MRI is necessary.  Will have psychiatry evaluate.      FINAL  CLINICAL IMPRESSION(S) / ED DIAGNOSES   Abnormal behavior   Note:  This document was prepared using Dragon voice recognition software and may include unintentional dictation errors.    Phineas Semen, MD 11/01/22 2218

## 2022-11-01 NOTE — ED Notes (Signed)
Pt refused MRI.

## 2022-11-01 NOTE — ED Notes (Addendum)
Pt took approximately 15 minutes to urinate, off an on urinating a little bit at a time. Moving extremely slow.

## 2022-11-01 NOTE — ED Triage Notes (Signed)
Patient to ED via POV for AMS since this weekend. Husband reports wife was living down in [facility] and was taken to the hospital last week- he is unsure why. Family brought her home and has been confused and wondered out of the family home last PM. Patient only oriented to self.

## 2022-11-02 ENCOUNTER — Emergency Department: Payer: BC Managed Care – PPO

## 2022-11-02 ENCOUNTER — Inpatient Hospital Stay: Payer: BC Managed Care – PPO

## 2022-11-02 ENCOUNTER — Encounter: Payer: Self-pay | Admitting: Internal Medicine

## 2022-11-02 DIAGNOSIS — E663 Overweight: Secondary | ICD-10-CM

## 2022-11-02 DIAGNOSIS — E876 Hypokalemia: Secondary | ICD-10-CM | POA: Diagnosis not present

## 2022-11-02 DIAGNOSIS — G9340 Encephalopathy, unspecified: Secondary | ICD-10-CM | POA: Diagnosis not present

## 2022-11-02 DIAGNOSIS — F419 Anxiety disorder, unspecified: Secondary | ICD-10-CM

## 2022-11-02 DIAGNOSIS — Z8249 Family history of ischemic heart disease and other diseases of the circulatory system: Secondary | ICD-10-CM | POA: Diagnosis not present

## 2022-11-02 DIAGNOSIS — G0490 Encephalitis and encephalomyelitis, unspecified: Secondary | ICD-10-CM | POA: Diagnosis present

## 2022-11-02 DIAGNOSIS — Z6828 Body mass index (BMI) 28.0-28.9, adult: Secondary | ICD-10-CM | POA: Diagnosis not present

## 2022-11-02 DIAGNOSIS — Z79899 Other long term (current) drug therapy: Secondary | ICD-10-CM | POA: Diagnosis not present

## 2022-11-02 DIAGNOSIS — Z7985 Long-term (current) use of injectable non-insulin antidiabetic drugs: Secondary | ICD-10-CM | POA: Diagnosis not present

## 2022-11-02 DIAGNOSIS — F32A Depression, unspecified: Secondary | ICD-10-CM | POA: Diagnosis not present

## 2022-11-02 DIAGNOSIS — G9341 Metabolic encephalopathy: Principal | ICD-10-CM

## 2022-11-02 DIAGNOSIS — Z7189 Other specified counseling: Secondary | ICD-10-CM | POA: Diagnosis not present

## 2022-11-02 DIAGNOSIS — W57XXXD Bitten or stung by nonvenomous insect and other nonvenomous arthropods, subsequent encounter: Secondary | ICD-10-CM | POA: Diagnosis not present

## 2022-11-02 DIAGNOSIS — Z823 Family history of stroke: Secondary | ICD-10-CM | POA: Diagnosis not present

## 2022-11-02 DIAGNOSIS — R7303 Prediabetes: Secondary | ICD-10-CM | POA: Diagnosis present

## 2022-11-02 DIAGNOSIS — Z9183 Wandering in diseases classified elsewhere: Secondary | ICD-10-CM | POA: Diagnosis not present

## 2022-11-02 DIAGNOSIS — R410 Disorientation, unspecified: Secondary | ICD-10-CM

## 2022-11-02 DIAGNOSIS — R569 Unspecified convulsions: Secondary | ICD-10-CM | POA: Diagnosis not present

## 2022-11-02 DIAGNOSIS — A86 Unspecified viral encephalitis: Secondary | ICD-10-CM | POA: Diagnosis not present

## 2022-11-02 DIAGNOSIS — Z515 Encounter for palliative care: Secondary | ICD-10-CM | POA: Diagnosis not present

## 2022-11-02 DIAGNOSIS — A8100 Creutzfeldt-Jakob disease, unspecified: Secondary | ICD-10-CM | POA: Diagnosis not present

## 2022-11-02 DIAGNOSIS — Z91048 Other nonmedicinal substance allergy status: Secondary | ICD-10-CM | POA: Diagnosis not present

## 2022-11-02 DIAGNOSIS — Z803 Family history of malignant neoplasm of breast: Secondary | ICD-10-CM | POA: Diagnosis not present

## 2022-11-02 DIAGNOSIS — Z818 Family history of other mental and behavioral disorders: Secondary | ICD-10-CM | POA: Diagnosis not present

## 2022-11-02 DIAGNOSIS — Z800 Family history of malignant neoplasm of digestive organs: Secondary | ICD-10-CM | POA: Diagnosis not present

## 2022-11-02 DIAGNOSIS — E43 Unspecified severe protein-calorie malnutrition: Secondary | ICD-10-CM | POA: Diagnosis not present

## 2022-11-02 LAB — PROTIME-INR
INR: 1.1 (ref 0.8–1.2)
Prothrombin Time: 14.1 seconds (ref 11.4–15.2)

## 2022-11-02 LAB — APTT: aPTT: 26 seconds (ref 24–36)

## 2022-11-02 LAB — HCG, QUANTITATIVE, PREGNANCY: hCG, Beta Chain, Quant, S: 2 m[IU]/mL (ref <5)

## 2022-11-02 LAB — AMMONIA: Ammonia: <10 umol/L (ref 9–35)

## 2022-11-02 LAB — MAGNESIUM: Magnesium: 2.3 mg/dL (ref 1.7–2.4)

## 2022-11-02 LAB — T4, FREE: Free T4: 1.04 ng/dL (ref 0.61–1.12)

## 2022-11-02 LAB — TSH: TSH: 2.862 u[IU]/mL (ref 0.350–4.500)

## 2022-11-02 MED ORDER — LORAZEPAM 2 MG/ML IJ SOLN
2.0000 mg | Freq: Once | INTRAMUSCULAR | Status: AC | PRN
  Administered 2022-11-02: 2 mg via INTRAVENOUS
  Filled 2022-11-02: qty 1

## 2022-11-02 MED ORDER — POTASSIUM CHLORIDE CRYS ER 20 MEQ PO TBCR: 40.0000 meq | EXTENDED_RELEASE_TABLET | Freq: Once | ORAL | Status: DC

## 2022-11-02 MED ORDER — POTASSIUM CHLORIDE 20 MEQ PO PACK
40.0000 meq | PACK | Freq: Once | ORAL | Status: AC
  Administered 2022-11-02: 40 meq via ORAL
  Filled 2022-11-02: qty 2

## 2022-11-02 MED ORDER — ACETAMINOPHEN 650 MG RE SUPP: 650.0000 mg | Freq: Four times a day (QID) | RECTAL | Status: DC | PRN

## 2022-11-02 MED ORDER — DOXYCYCLINE HYCLATE 100 MG PO TABS
100.0000 mg | ORAL_TABLET | Freq: Two times a day (BID) | ORAL | Status: DC
  Administered 2022-11-02 – 2022-11-03 (×2): 100 mg via ORAL
  Filled 2022-11-02 (×2): qty 1

## 2022-11-02 MED ORDER — ONDANSETRON HCL 4 MG/2ML IJ SOLN: 4.0000 mg | Freq: Three times a day (TID) | INTRAMUSCULAR | Status: DC | PRN

## 2022-11-02 MED ORDER — THIAMINE HCL 100 MG/ML IJ SOLN
500.0000 mg | Freq: Three times a day (TID) | INTRAVENOUS | Status: DC
  Administered 2022-11-02 – 2022-11-03 (×2): 500 mg via INTRAVENOUS
  Filled 2022-11-02 (×4): qty 5

## 2022-11-02 MED ORDER — HYDRALAZINE HCL 20 MG/ML IJ SOLN: 5.0000 mg | INTRAMUSCULAR | Status: DC | PRN

## 2022-11-02 MED ORDER — ACETAMINOPHEN 325 MG PO TABS: 650.0000 mg | ORAL_TABLET | Freq: Four times a day (QID) | ORAL | Status: DC | PRN

## 2022-11-02 MED ORDER — IOHEXOL 350 MG/ML SOLN
75.0000 mL | Freq: Once | INTRAVENOUS | Status: AC | PRN
  Administered 2022-11-02: 75 mL via INTRAVENOUS

## 2022-11-02 NOTE — Consult Note (Signed)
Neurology Consultation Reason for Consult: Altered Mental Status Referring Physician: New, X  CC: Altered mental status  History is obtained from: Husband, neighbor  HPI: Jody Nolan is a 57 y.o. female who presents with several week history of altered mental status.  Her husband suspects that is been going on for approximately 3 weeks.  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  She finished her school year in June.  She went on a trip in April with her family and those last time the husband spend a significant amount of time with her, and she seemed relatively normal at that time.  The patient and her husband have had a strained relationship for some time, and have been living apart but were still married.  They have been discussing reconciliation versus proceeding with divorce, but had not made any firm decisions until 2 weeks ago.  At that time, her husband found out that she was having an affair.  Apparently the boyfriend did not know that she was still married and there was an argument at that time.  9 days ago, she was found wandering around her apartment complex, and seemed disoriented and confused and was [facility] where head CT was performed and is negative.  This was attributed to anxiety associated with relationship struggles that she was going through the time she was discharged to the care of her boyfriend.  Since that time she has apparently been disoriented and confused.  Her downstairs neighbor took her out to dinner and stated that she was unable to chew something off the menu, was acting very odd.  Due to this, she called the patient's husband and mother and they went down and got her to bring her back to stay with her mother.  Since that time, her husband has been  seeing her regularly, and feels that she may be slightly worse than when he first saw her.  Her speech cadence is decreasing, and she is not speaking much at all at this point.  The patient is not able to provide much in the way of history or review of systems, but in late May she did have a urinary tract infection with Staph saprophyticus.  On June 28, she sought care in an urgent care after finding a tick on her back which was removed at that time.  She was prescribed doxycycline, though it is unclear if she had any symptoms other than simply having found a tick.  It is unclear if she took her doxycycline.  Also of note, the patient has been aggressively losing weight over the past few months intentionally.  She has apparently not been eating well, has lost approximately 80 pounds over the past few years.  Past Medical History:  Diagnosis Date   Allergic rhinitis    Degeneration of cervical intervertebral disc    DUB (dysfunctional uterine bleeding)    Family history of adverse reaction to anesthesia    Mothr - PONV   Family history of colon cancer    History of abnormal cervical Pap smear    History of chicken pox    Radiculopathy    Reaction, stress, acute    Shoulder pain      Family History  Problem Relation Age of Onset   Breast  cancer Mother 32   Colon cancer Father    Hypertension Brother    Stroke Paternal Grandmother    Hypertension Paternal Grandmother    Breast cancer Maternal Aunt 67     Social History:  reports that she has never smoked. She has never used smokeless tobacco. She reports current alcohol use of about 1.0 standard drink of alcohol per week. She reports that she does not use drugs.   Exam: Current vital signs: BP 121/83 (BP Location: Left Arm)   Pulse (!) 101   Temp 98.5 F (36.9 C)   Resp 20   SpO2 97%  Vital signs in last 24 hours: Temp:  [98.3 F (36.8 C)-98.8 F (37.1 C)] 98.5 F (36.9 C) (07/11 1707) Pulse Rate:  [70-101] 101 (07/11  1707) Resp:  [18-20] 20 (07/11 1707) BP: (109-130)/(76-87) 121/83 (07/11 1707) SpO2:  [97 %-99 %] 97 % (07/11 1707)   Physical Exam  Appears well-developed and well-nourished.   Neuro: Mental Status: Patient is awake, she only marginally engages with examiner, she perseverates on answers, often which are nonsensical in the setting of the question asked.  When asked her if she is in any pain she answers "I am on the other side" when asked her to elaborate she is unable to.  She is unable to answer orientation questions.  She is able to readily follow commands. Cranial Nerves: II: Visual Fields are full. Pupils are equal, round, and reactive to light.   III,IV, VI: EOMI without ptosis or diploplia.  V: Facial sensation is symmetric to temperature VII: Facial movement is symmetric.  VIII: hearing is intact to voice X: Uvula elevates symmetrically XI: Shoulder shrug is symmetric. XII: tongue is midline without atrophy or fasciculations.  Motor: Tone is normal. Bulk is normal. 5/5 strength was present in all four extremities.  Sensory: Sensation is symmetric to light touch and temperature in the arms and legs. Cerebellar: FNF and HKS are intact bilaterally   I have reviewed labs in epic and the results pertinent to this consultation are: Ammonia less than 10 UDS is negative Urinalysis is negative Beta-hCG is negative Magnesium is normal  I have reviewed the images obtained: MRI brain-hyperintensity on diffusion and T2 isolated to the cortex as well as caudate, predominant on the left.  Impression: 57 year old female with relatively abrupt onset severe confusion with cortical ribboning on MRI.  The differential is exceedingly broad, and further evaluation is needed.  I would favor lumbar puncture, however currently her mother is unavailable to provide consent, given that this is been going on for several weeks I do not think it is hyper emergent.  She will be back in town tomorrow, at  which point I can discuss with both her husband(whom she is divorcing) as well as her mother for consent.  The presence of a tick several days prior to this does raise the possibility of tickborne illness or some type of immune reaction in response to it.    Recommendations: 1) RPR, HIV, TSH, B12, B1, antithyroid antibodies, ANA, serum arbovirus panel, serum autoimmune encephalitis panel, heavy metal screen, Lyme titers 2) LP for cells, protein, glucose, IgG index, oligoclonal bands, CSF VDRL, CSF autoimmune panel, tickborne illnesses 3) high-dose thiamine pending B1 result 4) I will continue to try and get information regarding time course of her illness and antecedent symptoms 5) neurology will continue to follow    Ritta Slot, MD [facility] [phone]  If 7pm- 7am, please page neurology on call as  listed in AMION.

## 2022-11-02 NOTE — Consult Note (Addendum)
Telepsych Consultation   Reason for Consult:  Altered Mental status Referring Physician: Phineas Semen  Location of Patient: [facility] Location of Provider: Other: [facility]  Patient Identification: Jody Nolan MRN:  [id number] Principal Diagnosis: Altered mental status Diagnosis:  Active Problems:   Disorientation   Total Time spent with patient: 20 minutes  Subjective:   Jody Nolan is a 57 y.o. female patient admitted with psychiatric history of depression, who presented to [facility] via POV for AMS. Per ED triage notes "Husband reports wife was living down in [facility] and was taken to the hospital last week- he is unsure why. Family brought her home and has been confused and wondered out of the family home last PM. Patient only oriented to self".   HPI:  On evaluation, patient is alert, oriented x 1(name). Speech is slow, clear, with thought blocking, patient is mostly mute with a confused look on her face.  Her right hand movements is in slow motion when she was asked to wave if she could see and hear this provider from the telepsych device.  Pt appears dressed in hospital scrubs. Eye contact is blank. Mood is euthymic, affect is flat and congruent with mood. Thought process is linear and circumstantial and thought content is perseverative. Unable to assess for SI/HI/AVH due to patient's current AMS, which might yield wrong/incorrect responses. There is no objective indication that the patient is responding to internal stimuli. No delusions elicited during this assessment.    During this evaluation, patient is lying still on her hospital bed and her husband Duffy it to [phone] and her mother is also made [phone] are present in the room for support and provided collateral information.  After introducing myself role and purpose of psychiatric evaluation, this provider asked patient to state her full name and patient reports "I asked about your full name".  Patient repeated the  statement about 4-5 times and was eventually able to state her full name after reassurance from her husband who has concerns the patient appears to be suspicious of me. Patient was reassured of my role and intention again, at which point she is slowly stated helpful names.  Patient was unable to answer other orientation questions. When this provider asked the patient if she had faced any traumatic events, patient slowly replied "I don't know anything, I don't know".  Patient appears disengaged/mute to my questions.  Will recommend face-to-face evaluation in the a.m. by psychiatric provider.  Patient's husband Duffy provided collateral which is supported by the patient's mother [name].  Duffy reports the [place] and her neighbors and friends report they noticed similar behaviors from the patient a week ago.  Patient had a welfare check and the police officers reported that they talked to her and she was incoherent and not understanding any questions she was asked, which is patient's current presentation. Duffie reports he had seen the patient 2 weeks prior when she came home to [facility] and the patient seemed fine but he noticed she had intentionally lost weight and thought it was because she was getting weight loss injections. He reports the patient's mother went over to [place] and was able to return with the patient over the weekend after he talked the situation over with their children and they agreed to bring her back home for proper medical care as she was living alone by herself in [facility] and her neighbors had safety concerns about her and reports that they found her wandering around the  condominium complex and parking lot incoherent.  He reports that the patient randomly got up in a confused/disoriented state the night before they came to the ED, and disappeared from the house without informing anyone, prompting the family to search for her and eventually  called 911, but the patient reappeared 30 minutes later and he is unsure how long the disorientation and confusion lasted.  He reports that he has lived apart from the patient the past 3 years but is aware that the patient was diagnosed with depression and was on escitalopram in the past.  He reports that the patient sees an outpatient provider at times at [facility] but he is not aware of her current diagnosis or prescription medications, but he had seen a lot of pill bottles when he went to visit in [place].  Duffy reports the family is awaiting the patient's hospital records from her recent inpatient hospitalization in [place], where she had a CAT scan done.  He also worried if the patient had suffered a stroke and reports the patient was scheduled for an MRI, but she refused because she is claustrophobic.   The patient's mother reports a family history of dementia in patient's grandmother who lived up to her 56s.  The patient's mother reports "this is new, we have never seen her like this and we are worried about her safety and unsure of what is going on with her".  Support, encouragement, reassurance provided about ongoing stressors.  Patient provided with opportunity for questions. Patient's UDS is negative and potassium low at 3.1. Will order with 40 mEQ Kcl x1  for hypokalemia.  Discussed recommendation for continuous observation/safety monitoring and face-to-face re-eval in the a.m. by psych provider as patient is unable to provide any useful information regarding her condition during this evaluation.   Recommend neurological assessment/exam and vigorous medical workup to rule out any underlying medical condition.   Past Psychiatric History: Depression  Risk to Self:  UTA Risk to Others:  UTA Prior Inpatient Therapy:  UTA Prior Outpatient Therapy:  UTA  Past Medical History:  Past Medical History:  Diagnosis Date   Allergic rhinitis    Degeneration of  cervical intervertebral disc    DUB (dysfunctional uterine bleeding)    Family history of adverse reaction to anesthesia    Mothr - PONV   Family history of colon cancer    History of abnormal cervical Pap smear    History of chicken pox    Radiculopathy    Reaction, stress, acute    Shoulder pain     Past Surgical History:  Procedure Laterality Date   ABDOMINAL HYSTERECTOMY     partial   CESAREAN SECTION  2000   COLONOSCOPY WITH PROPOFOL N/A 03/29/2018   Procedure: COLONOSCOPY WITH BIOPSY;  Surgeon: Midge Minium, MD;  Location: [facility];  Service: Endoscopy;  Laterality: N/A;   LEEP  1990's   POLYPECTOMY N/A 03/29/2018   Procedure: POLYPECTOMY;  Surgeon: Midge Minium, MD;  Location: [facility];  Service: Endoscopy;  Laterality: N/A;   Family History:  Family History  Problem Relation Age of Onset   Breast cancer Mother 68   Colon cancer Father    Hypertension Brother    Stroke Paternal Grandmother    Hypertension Paternal Grandmother    Breast cancer Maternal Aunt 77   Family Psychiatric  History: Dementia-maternal grandmother Social History:  Social History   Substance and Sexual Activity  Alcohol Use Yes   Alcohol/week: 1.0  standard drink of alcohol   Types: 1 Standard drinks or equivalent per week     Social History   Substance and Sexual Activity  Drug Use No    Social History   Socioeconomic History   Marital status: Married    Spouse name: Not on file   Number of children: 2   Years of education: College Grad   Highest education level: Not on file  Occupational History   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: [employer]  Tobacco Use   Smoking status: Never   Smokeless tobacco: Never  Vaping Use   Vaping status: Never Used  Substance and Sexual Activity   Alcohol use: Yes    Alcohol/week: 1.0 standard drink of alcohol    Types: 1 Standard drinks or equivalent per week   Drug  use: No   Sexual activity: Yes  Other Topics Concern   Not on file  Social History Narrative   Not on file   Social Determinants of Health   Financial Resource Strain: Not on file  Food Insecurity: Not on file  Transportation Needs: Not on file  Physical Activity: Not on file  Stress: Not on file  Social Connections: Not on file   Additional Social History:    Allergies:   Allergies  Allergen Reactions   Adhesive [Tape] Rash    Bandaids cause rash    Labs:  Results for orders placed or performed during the hospital encounter of 11/01/22 (from the past 48 hour(s))  Comprehensive metabolic panel     Status: Abnormal   Collection Time: 11/01/22  2:39 PM  Result Value Ref Range   Sodium 140 135 - 145 mmol/L   Potassium 3.1 (L) 3.5 - 5.1 mmol/L   Chloride 106 98 - 111 mmol/L   CO2 26 22 - 32 mmol/L   Glucose, Bld 105 (H) 70 - 99 mg/dL    Comment: Glucose reference range applies only to samples taken after fasting for at least 8 hours.   BUN 16 6 - 20 mg/dL   Creatinine, Ser 4.78 0.44 - 1.00 mg/dL   Calcium 9.3 8.9 - 29.5 mg/dL   Total Protein 7.1 6.5 - 8.1 g/dL   Albumin 4.5 3.5 - 5.0 g/dL   AST 33 15 - 41 U/L   ALT 30 0 - 44 U/L   Alkaline Phosphatase 40 38 - 126 U/L   Total Bilirubin 1.1 0.3 - 1.2 mg/dL   GFR, Estimated >62 >13 mL/min    Comment: (NOTE) Calculated using the CKD-EPI Creatinine Equation (2021)    Anion gap 8 5 - 15    Comment: Performed at [facility], [address] [place], [place] [address]  CBC     Status: None   Collection Time: 11/01/22  2:39 PM  Result Value Ref Range   WBC 5.9 4.0 - 10.5 K/uL   RBC 4.60 3.87 - 5.11 MIL/uL   Hemoglobin 13.7 12.0 - 15.0 g/dL   HCT 84.6 96.2 - 95.2 %   MCV 90.7 80.0 - 100.0 fL   MCH 29.8 26.0 - 34.0 pg   MCHC 32.9 30.0 - 36.0 g/dL   RDW 84.1 32.4 - 40.1 %   Platelets 280 150 - 400 K/uL   nRBC 0.0 0.0 - 0.2 %    Comment: Performed at [facility], [address], [place],  [place] [address]  Ethanol     Status: None  Collection Time: 11/01/22  5:00 PM  Result Value Ref Range   Alcohol, Ethyl (B) <10 <10 mg/dL    Comment: (NOTE) Lowest detectable limit for serum alcohol is 10 mg/dL.  For medical purposes only. Performed at [facility], [address] [place], [place] [address]   Acetaminophen level     Status: Abnormal   Collection Time: 11/01/22  5:00 PM  Result Value Ref Range   Acetaminophen (Tylenol), Serum <10 (L) 10 - 30 ug/mL    Comment: (NOTE) Therapeutic concentrations vary significantly. A range of 10-30 ug/mL  may be an effective concentration for many patients. However, some  are best treated at concentrations outside of this range. Acetaminophen concentrations >150 ug/mL at 4 hours after ingestion  and >50 ug/mL at 12 hours after ingestion are often associated with  toxic reactions.  Performed at [facility], [address] [place], [place] [address]   Salicylate level     Status: Abnormal   Collection Time: 11/01/22  5:00 PM  Result Value Ref Range   Salicylate Lvl <7.0 (L) 7.0 - 30.0 mg/dL    Comment: Performed at [facility], [address] [place], [place] [address]  Urine Drug Screen, Qualitative ([facility] only)     Status: None   Collection Time: 11/01/22  5:47 PM  Result Value Ref Range   Tricyclic, Ur Screen NONE DETECTED NONE DETECTED   Amphetamines, Ur Screen NONE DETECTED NONE DETECTED   MDMA (Ecstasy)Ur Screen NONE DETECTED NONE DETECTED   Cocaine Metabolite,Ur [place] NONE DETECTED NONE DETECTED   Opiate, Ur Screen NONE DETECTED NONE DETECTED   Phencyclidine (PCP) Ur S NONE DETECTED NONE DETECTED   Cannabinoid 50 Ng, Ur [place] NONE DETECTED NONE DETECTED   Barbiturates, Ur Screen NONE DETECTED NONE DETECTED   Benzodiazepine, Ur Scrn NONE DETECTED NONE DETECTED   Methadone Scn, Ur NONE DETECTED NONE DETECTED    Comment: (NOTE) Tricyclics + metabolites, urine    Cutoff 1000 ng/mL Amphetamines +  metabolites, urine  Cutoff 1000 ng/mL MDMA (Ecstasy), urine              Cutoff 500 ng/mL Cocaine Metabolite, urine          Cutoff 300 ng/mL Opiate + metabolites, urine        Cutoff 300 ng/mL Phencyclidine (PCP), urine         Cutoff 25 ng/mL Cannabinoid, urine                 Cutoff 50 ng/mL Barbiturates + metabolites, urine  Cutoff 200 ng/mL Benzodiazepine, urine              Cutoff 200 ng/mL Methadone, urine                   Cutoff 300 ng/mL  The urine drug screen provides only a preliminary, unconfirmed analytical test result and should not be used for non-medical purposes. Clinical consideration and professional judgment should be applied to any positive drug screen result due to possible interfering substances. A more specific alternate chemical method must be used in order to obtain a confirmed analytical result. Gas chromatography / mass spectrometry (GC/MS) is the preferred confirm atory method. Performed at [facility], [address] [place], [place] [address]   Urinalysis, Complete w Microscopic -Urine, Clean Catch     Status: Abnormal   Collection Time: 11/01/22  5:47 PM  Result Value Ref Range   Color, Urine YELLOW (A) YELLOW   APPearance  CLEAR (A) CLEAR   Specific Gravity, Urine 1.024 1.005 - 1.030   pH 6.0 5.0 - 8.0   Glucose, UA NEGATIVE NEGATIVE mg/dL   Hgb urine dipstick NEGATIVE NEGATIVE   Bilirubin Urine NEGATIVE NEGATIVE   Ketones, ur 5 (A) NEGATIVE mg/dL   Protein, ur NEGATIVE NEGATIVE mg/dL   Nitrite NEGATIVE NEGATIVE   Leukocytes,Ua NEGATIVE NEGATIVE   RBC / HPF 0-5 0 - 5 RBC/hpf   WBC, UA 0-5 0 - 5 WBC/hpf   Bacteria, UA NONE SEEN NONE SEEN   Squamous Epithelial / HPF NONE SEEN 0 - 5 /HPF   Mucus PRESENT    Hyaline Casts, UA PRESENT     Comment: Performed at [facility], [address] [place], [place] [address]    Medications:  Current Facility-Administered Medications  Medication Dose Route Frequency Provider Last  Rate Last Admin   gadobutrol (GADAVIST) 1 MMOL/ML injection 7 mL  7 mL Intravenous Once PRN Phineas Semen, MD       LORazepam (ATIVAN) injection 2 mg  2 mg Intravenous Once Phineas Semen, MD       Current Outpatient Medications  Medication Sig Dispense Refill   ALPRAZolam (XANAX) 0.25 MG tablet Take 1 tablet (0.25 mg total) by mouth daily. 90 tablet 1   escitalopram (LEXAPRO) 20 MG tablet TAKE 1 TABLET BY MOUTH EVERY DAY (Patient taking differently: Take 20 mg by mouth daily. Patient is taking 10 mg) 14 tablet 0   fluticasone (FLONASE) 50 MCG/ACT nasal spray Place 2 sprays into both nostrils in the morning and at bedtime. 18.2 mL 11   montelukast (SINGULAIR) 10 MG tablet TAKE 1 TABLET BY MOUTH EVERYDAY AT BEDTIME 90 tablet 3   phentermine 37.5 MG capsule Take 1 capsule (37.5 mg total) by mouth every morning. 90 capsule 1   Semaglutide-Weight Management 2.4 MG/0.75ML SOAJ Inject 2.4 mg into the skin once a week. 9 mL 4   valACYclovir (VALTREX) 1000 MG tablet TAKE 1 TABLET (1,000 MG TOTAL) BY MOUTH 2 (TWO) TIMES DAILY. TAKE 2 TABLETS TWICE DAILY FOR 5-7 DAYS FOR CURRENT FLARE 20 tablet 1    Musculoskeletal: Strength & Muscle Tone: within normal limits Gait & Station: normal Patient leans: N/A  Psychiatric Specialty Exam:  Presentation  General Appearance:  Other (comment) (in hospital scrubs)  Eye Contact: Other (comment) (blank stare)  Speech: Blocked  Speech Volume: Decreased  Handedness: -- (UTA)   Mood and Affect  Mood: Euthymic  Affect: Congruent   Thought Process  Thought Processes: Linear  Descriptions of Associations:Circumstantial  Orientation:Partial (to name only)  Thought Content:Perseveration  History of Schizophrenia/Schizoaffective disorder:No  Duration of Psychotic Symptoms:Greater than six months  Hallucinations:Hallucinations: Other (comment) (UTA, patient disoriented)  Ideas of Reference:Paranoia  Suicidal Thoughts:Suicidal  Thoughts: -- (UTA, patient disoriented)  Homicidal Thoughts:Homicidal Thoughts: -- (UTA, patient disoriented)   Sensorium  Memory: Immediate Poor; Remote Poor; Recent Poor  Judgment: Poor  Insight: None   Executive Functions  Concentration: Poor  Attention Span: Poor  Recall: Poor  Fund of Knowledge: Poor  Language: Poor   Psychomotor Activity  Psychomotor Activity: Psychomotor Activity: Other (comment) (slow movements)   Assets  Assets: Social Support; Desire for Improvement   Sleep  Sleep: Sleep: Poor   Physical Exam: Physical Exam Constitutional:      General: She is not in acute distress.    Appearance: She is not diaphoretic.  HENT:     Head: Normocephalic.     Right Ear: External ear normal.  Left Ear: External ear normal.     Nose: No congestion.  Eyes:     General:        Right eye: No discharge.        Left eye: No discharge.  Pulmonary:     Effort: No respiratory distress.  Chest:     Chest wall: No tenderness.  Neurological:     Mental Status: She is alert. She is disoriented.  Psychiatric:        Mood and Affect: Affect is flat and inappropriate.        Speech: She is noncommunicative.        Behavior: Behavior is slowed.        Cognition and Memory: Cognition is impaired. Memory is impaired. She exhibits impaired recent memory and impaired remote memory.        Judgment: Judgment is inappropriate.    Review of Systems  Constitutional:  Negative for chills, diaphoresis and fever.  HENT:  Negative for congestion.   Eyes:  Negative for discharge.  Respiratory:  Negative for cough, shortness of breath and wheezing.   Cardiovascular:  Negative for chest pain and palpitations.  Gastrointestinal:  Negative for diarrhea, nausea and vomiting.  Neurological:  Negative for dizziness, seizures, loss of consciousness and headaches.  Psychiatric/Behavioral:  Positive for memory loss.    Blood pressure 129/87, pulse (!) 103,  temperature 98.7 F (37.1 C), temperature source Oral, resp. rate 18, SpO2 100%. There is no height or weight on file to calculate BMI.  Treatment Plan Summary: Daily contact with patient to assess and evaluate symptoms and progress in treatment and Plan continuous psych observation. Patient needs an extensive medical workup to include a neurological exam, MRI, and review of hospital records from [facility] before an appropriate final psychiatric recommendation.  Disposition:  Recommend continuous psych observation overnight for safety monitoring  and face to face eval in am.  This service was provided via telemedicine using a 2-way, interactive audio and video technology.  Names of all persons participating in this telemedicine service and their role in this encounter. Name: Estephania Licciardi Role: Patient  Name: Mancel Bale Role: NP  Name: Andee Poles Role: LCAS  Name: Smitty Pluck Role: Patient's husband    Mancel Bale, NP 11/02/2022 2:25 AM

## 2022-11-02 NOTE — ED Provider Notes (Addendum)
-----------------------------------------   5:48 AM on 11/02/2022 -----------------------------------------  I took over care of this patient from Dr. Derrill Kay.  I consulted and discussed the case with the psychiatry provider who recommends further neurologic workup before the patient could be medically cleared.  MR brain shows findings as follows:  IMPRESSION:  Widespread, bilateral abnormal cortical diffusion and FLAIR signal  in a nonspecific pattern. Suspect asymmetric caudate nucleus  involvement also on the left. But white matter seems spared, there  is no associated abnormal enhancement, and no intracranial mass  effect.    Broad differential diagnosis at this point and recommend Neurology  consultation with consideration of:  Infectious Meningoencephalitis (although lack of enhancement argues  against), Reversible cerebral vasoconstriction (RCVS), Urea cycle  disorders/Uremia, Hypoglycemia, Seizure disorder, PRES (although  sparing of white matter argues against), Hepatic encephalopathy,  Mitochondrial disorder, and Creutzfeldt-jakob disease.   In terms of the differential listed by radiology:  Infectious meningeal encephalitis: The patient has no fever, leukocytosis, or other signs or symptoms of infection and the symptoms have been present for weeks.  There is no clinical evidence of infectious etiology.  Hyperglycemia: Glucose is normal  Hepatic encephalopathy: I have added on an ammonia level  Uremia: BUN and creatinine are normal  The patient will need neurology consultation in person this morning when neurology is available after 8 AM.  ----------------------------------------- 7:40 AM on 11/02/2022 -----------------------------------------  Since the patient will need neurology consultation and likely further workup and treatment and is not well enough to go home, I consulted Dr. Clyde Lundborg from the hospitalist service; based on our discussion he agrees to evaluate the  patient for admission.    Dionne Bucy, MD 11/02/22 [phone]

## 2022-11-02 NOTE — H&P (Addendum)
History and Physical    Jody Nolan UJW:[id number] DOB: 16-Dec-1965 DOA: 11/01/2022  Referring MD/NP/PA:   PCP: Jacky Kindle, FNP   Patient coming from:  The patient is coming from home.     Chief Complaint: AMS  HPI: Jody Nolan is a 57 y.o. 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  Per her husband and mother at bedside, pt have been living in at the [facility] for a little while. Pt was noted by neighbor friend to be confused about 3 weeks ago, and has been confused for more than 3 weeks. Pt was seen in hospital at [facility] and reportedly had negative CT of head per ED physician.  At her normal baseline, patient is alert, oriented x 3.  Patient received 1 dose of 2 mg of Ativan in ED for MRI for brain.  When I saw patient in ED, patient is confused, but arousable, not orientated x 3.  Patient moves all extremities.  No facial droop or slurred speech.  Per family, patient was able to walk at home and to eat food without choking.  No active nausea vomiting, diarrhea, respiratory distress, cough noted.  Patient does not seem to have chest pain or abdominal pain.  Did not complain symptoms of UTI.  No fever or chills.  Data reviewed independently and ED Course: pt was found to have WBC 5.9, negative UDS, potassium 3.1, GFR> 60, ammonia level less than 10, temperature normal, blood pressure 124/81, heart rate 103, 92, RR 18, oxygen saturation 98-100% on room air.  Patient is admit to TeleMed as inpt. Dr. Amada Jupiter of neurology is consulted. LCAS-A of BH, Hand, Jamila is consuted.  MRI-brain w/ and w/o contrast Widespread, bilateral abnormal cortical diffusion and FLAIR signal in a nonspecific pattern. Suspect asymmetric caudate nucleus involvement also on the left. But white matter seems  spared, there is no associated abnormal enhancement, and no intracranial mass effect.   Broad differential diagnosis at this point and recommend Neurology consultation with consideration of: Infectious Meningoencephalitis (although lack of enhancement argues against), Reversible cerebral vasoconstriction (RCVS), Urea cycle disorders/Uremia, Hypoglycemia, Seizure disorder, PRES (although sparing of white matter argues against), Hepatic encephalopathy, Mitochondria l disorder, and Creutzfeldt-jakob disease.    EKG:  Not done in ED, will get one.   Review of Systems: Could not review due to altered mental status.    Allergy:  Allergies  Allergen Reactions   Adhesive [Tape] Rash    Bandaids cause rash    Past Medical History:  Diagnosis Date   Allergic rhinitis    Degeneration of cervical intervertebral disc    DUB (dysfunctional uterine bleeding)    Family history of adverse reaction to anesthesia    Mothr - PONV   Family history of colon cancer    History of abnormal cervical Pap smear    History of chicken pox    Radiculopathy    Reaction, stress, acute    Shoulder pain     Past Surgical History:  Procedure Laterality Date   ABDOMINAL HYSTERECTOMY     partial   CESAREAN SECTION  2000   COLONOSCOPY WITH PROPOFOL N/A 03/29/2018   Procedure: COLONOSCOPY WITH BIOPSY;  Surgeon: Midge Minium, MD;  Location: [facility];  Service: Endoscopy;  Laterality: N/A;   LEEP  1990's   POLYPECTOMY N/A 03/29/2018   Procedure: POLYPECTOMY;  Surgeon: Servando Snare,  Darren, MD;  Location: [facility];  Service: Endoscopy;  Laterality: N/A;    Social History:  reports that she has never smoked. She has never used smokeless tobacco. She reports current alcohol use of about 1.0 standard drink of alcohol per week. She reports that she does not use drugs.  Family History:  Family History  Problem Relation Age of Onset   Breast cancer Mother 51   Colon cancer Father     Hypertension Brother    Stroke Paternal Grandmother    Hypertension Paternal Grandmother    Breast cancer Maternal Aunt 42     Prior to Admission medications   Medication Sig Start Date End Date Taking? Authorizing Provider  ALPRAZolam (XANAX) 0.25 MG tablet Take 1 tablet (0.25 mg total) by mouth daily. 05/01/22   Jacky Kindle, FNP  escitalopram (LEXAPRO) 20 MG tablet TAKE 1 TABLET BY MOUTH EVERY DAY Patient taking differently: Take 20 mg by mouth daily. Patient is taking 10 mg 04/04/22   Jacky Kindle, FNP  fluticasone [facility]) 50 MCG/ACT nasal spray Place 2 sprays into both nostrils in the morning and at bedtime. 03/11/21   Jacky Kindle, FNP  montelukast (SINGULAIR) 10 MG tablet TAKE 1 TABLET BY MOUTH EVERYDAY AT BEDTIME 03/09/22   Merita Norton T, FNP  phentermine 37.5 MG capsule Take 1 capsule (37.5 mg total) by mouth every morning. 05/01/22   Jacky Kindle, FNP  Semaglutide-Weight Management 2.4 MG/0.75ML SOAJ Inject 2.4 mg into the skin once a week. 03/14/22   Jacky Kindle, FNP  valACYclovir (VALTREX) 1000 MG tablet TAKE 1 TABLET (1,000 MG TOTAL) BY MOUTH 2 (TWO) TIMES DAILY. TAKE 2 TABLETS TWICE DAILY FOR 5-7 DAYS FOR CURRENT FLARE 08/08/22   Jacky Kindle, FNP    Physical Exam: Vitals:   11/02/22 0348 11/02/22 0925 11/02/22 1500 11/02/22 1707  BP: 124/81 130/87 109/76 121/83  Pulse: 92 70 80 (!) 101  Resp: 18 18  20   Temp: 98.8 F (37.1 C) 98.3 F (36.8 C)  98.5 F (36.9 C)  TempSrc: Oral Oral    SpO2: 98% 99% 99% 97%   General: Not in acute distress HEENT:       Eyes: PERRL, EOMI, no jaundice       ENT: No discharge from the ears and nose.       Neck: No JVD, no bruit, no mass felt. Heme: No neck lymph node enlargement. Cardiac: S1/S2, RRR, No murmurs, No gallops or rubs. Respiratory: No rales, wheezing, rhonchi or rubs. GI: Soft, nondistended, nontender, no organomegaly, BS present. GU: No hematuria Ext: No pitting leg edema bilaterally. 1+DP/PT pulse  bilaterally. Musculoskeletal: No joint deformities, No joint redness or warmth, no limitation of ROM in spin. Skin: No rashes.  Neuro: Confused, arousable, not oriented X3, cranial nerves II-XII grossly intact, moves all extremities normally. Psych: Patient is not psychotic, no suicidal or hemocidal ideation.  Labs on Admission: I have personally reviewed following labs and imaging studies  CBC: Recent Labs  Lab 11/01/22 1439  WBC 5.9  HGB 13.7  HCT 41.7  MCV 90.7  PLT 280   Basic Metabolic Panel: Recent Labs  Lab 11/01/22 1439 11/02/22 1612  NA 140  --   K 3.1*  --   CL 106  --   CO2 26  --   GLUCOSE 105*  --   BUN 16  --   CREATININE 0.67  --   CALCIUM 9.3  --   MG  --  2.3   GFR: CrCl cannot be calculated (Unknown ideal weight.). Liver Function Tests: Recent Labs  Lab 11/01/22 1439  AST 33  ALT 30  ALKPHOS 40  BILITOT 1.1  PROT 7.1  ALBUMIN 4.5   No results for input(s): "LIPASE", "AMYLASE" in the last 168 hours. Recent Labs  Lab 11/02/22 0628  AMMONIA <10   Coagulation Profile: Recent Labs  Lab 11/02/22 1612  INR 1.1   Cardiac Enzymes: No results for input(s): "CKTOTAL", "CKMB", "CKMBINDEX", "TROPONINI" in the last 168 hours. BNP (last 3 results) No results for input(s): "PROBNP" in the last 8760 hours. HbA1C: No results for input(s): "HGBA1C" in the last 72 hours. CBG: No results for input(s): "GLUCAP" in the last 168 hours. Lipid Profile: No results for input(s): "CHOL", "HDL", "LDLCALC", "TRIG", "CHOLHDL", "LDLDIRECT" in the last 72 hours. Thyroid Function Tests: Recent Labs    11/02/22 1704  TSH 2.862  FREET4 1.04   Anemia Panel: No results for input(s): "VITAMINB12", "FOLATE", "FERRITIN", "TIBC", "IRON", "RETICCTPCT" in the last 72 hours. Urine analysis:    Component Value Date/Time   COLORURINE YELLOW (A) 11/01/2022 1747   APPEARANCEUR CLEAR (A) 11/01/2022 1747   LABSPEC 1.024 11/01/2022 1747   PHURINE 6.0 11/01/2022 1747    GLUCOSEU NEGATIVE 11/01/2022 1747   HGBUR NEGATIVE 11/01/2022 1747   BILIRUBINUR NEGATIVE 11/01/2022 1747   BILIRUBINUR Negative 04/09/2018 1640   KETONESUR 5 (A) 11/01/2022 1747   PROTEINUR NEGATIVE 11/01/2022 1747   UROBILINOGEN 0.2 04/09/2018 1640   NITRITE NEGATIVE 11/01/2022 1747   LEUKOCYTESUR NEGATIVE 11/01/2022 1747   Sepsis Labs: @LABRCNTIP (procalcitonin:4,lacticidven:4) )No results found for this or any previous visit (from the past 240 hour(s)).   Radiological Exams on Admission: CT ANGIO HEAD NECK W WO CM  Result Date: 11/02/2022 CLINICAL DATA:  Altered mental status. EXAM: CT ANGIOGRAPHY HEAD AND NECK WITH AND WITHOUT CONTRAST TECHNIQUE: Multidetector CT imaging of the head and neck was performed using the standard protocol during bolus administration of intravenous contrast. Multiplanar CT image reconstructions and MIPs were obtained to evaluate the vascular anatomy. Carotid stenosis measurements (when applicable) are obtained utilizing NASCET criteria, using the distal internal carotid diameter as the denominator. RADIATION DOSE REDUCTION: This exam was performed according to the departmental dose-optimization program which includes automated exposure control, adjustment of the mA and/or kV according to patient size and/or use of iterative reconstruction technique. CONTRAST:  75mL OMNIPAQUE IOHEXOL 350 MG/ML SOLN COMPARISON:  Same day brain MRI FINDINGS: CT HEAD FINDINGS Brain: There is no acute intracranial hemorrhage, extra-axial fluid collection, or acute large vessel territorial infarct. The areas of diffusion restriction seen on the same-day brain MRI are not well appreciated on the current study. Parenchymal volume is normal. The ventricles are normal in size. Pituitary and suprasellar region are normal. There is no mass lesion. There is no mass effect or midline shift. Vascular: See below. Skull: Normal. Negative for fracture or focal lesion. Sinuses/Orbits: There is mild  mucosal thickening in the paranasal sinuses with hyperostosis of the maxillary sinus walls consistent with chronic sinusitis. The globes and orbits are unremarkable. Other: The mastoid air cells and middle ear cavities are clear. Review of the MIP images confirms the above findings CTA NECK FINDINGS Aortic arch: The imaged aortic arch is normal. The origins of the major branch vessels are patent. The subclavian arteries are patent to the level imaged. Right carotid system: The right common, internal, and external carotid arteries are patent, without hemodynamically significant stenosis or occlusion. There is no evidence  of dissection or aneurysm. Left carotid system: The left common, internal, and external carotid arteries are patent, without hemodynamically significant stenosis or occlusion. There is a beaded appearance of the internal carotid artery (14-20). 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 Vertebral arteries: The vertebral arteries are patent, without hemodynamically significant stenosis or occlusion there is no evidence of dissection or aneurysm. Skeleton: There is no acute osseous abnormality or suspicious osseous lesion. There is mild disc space narrowing degenerative endplate change C5-C6 and C6-C7. There is no visible canal hematoma. Other neck: The soft tissues of the neck are unremarkable. Upper chest: There is a small calcified granuloma in the right apex. The imaged lung apices are otherwise clear. Review of the MIP images confirms the above findings CTA HEAD FINDINGS Anterior circulation: The intracranial ICAs are patent, without significant stenosis or occlusion. The bilateral MCAs and ACAS are patent, without proximal stenosis or occlusion. The anterior communicating artery is normal. There is no aneurysm or AVM. Posterior circulation: The bilateral V4 segments are patent. The basilar artery is  patent. The major cerebellar arteries appear patent. The bilateral PCAs are patent, without proximal stenosis or occlusion. Right larger than left posterior communicating arteries are identified. There is no aneurysm or AVM. Venous sinuses: Choose Anatomic variants: None. Review of the MIP images confirms the above findings IMPRESSION: 1. Patent vasculature of the head and neck without hemodynamically significant or occlusion. No evidence of vasospasm or RCVS in the intracranial vasculature. 2. 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 Electronically Signed   By: Lesia Hausen M.D.   On: 11/02/2022 18:36   MR Brain W and Wo Contrast  Result Date: 11/02/2022 CLINICAL DATA:  57 year old female with recent change in behavior, altered mental status, disoriented. EXAM: MRI HEAD WITHOUT AND WITH CONTRAST TECHNIQUE: Multiplanar, multiecho pulse sequences of the brain and surrounding structures were obtained without and with intravenous contrast. CONTRAST:  7mL GADAVIST GADOBUTROL 1 MMOL/ML IV SOLN COMPARISON:  None Available. FINDINGS: Brain: Normal cerebral volume. No midline shift or evidence of intracranial mass effect. However, there is multifocal bilateral abnormal cortical diffusion of the brain, most apparent in the parietal lobes and greater on the left (series 5 images 33 and 36), but also affecting the posterolateral left temporal lobe (image 21). See also coronal DWI images 28 and 29. There is associated cortical FLAIR hyperintensity in some of those areas, but the underlying white matter seems spared. At the same time, there is similar asymmetric diffusion and FLAIR hyperintensity also in the left caudate nucleus (series 5 image 25). No mass effect associated in these areas. No T1 signal abnormality although rapid T1  imaging was utilized to minimize motion. And furthermore, there could be abnormally exaggerated cortical diffusion throughout the anterior frontal lobes, versus artifact (series 5, image 35). Cortical FLAIR hyperintensity there also difficult to exclude. No abnormal enhancement identified. No dural thickening. No superimposed ventriculomegaly, ventricular debris, extra-axial collection or convincing acute intracranial hemorrhage. Cervicomedullary junction and pituitary are within normal limits. No hemosiderin or mineralization on T2* imaging. Outside of the left caudate nucleus the deep gray nuclei, brainstem and cerebellum appear negative. Vascular: Major intracranial vascular flow voids appear preserved. Skull and upper cervical spine: Negative visible cervical spine. Visualized bone marrow signal is within normal  limits. Sinuses/Orbits: Disconjugate gaze but otherwise negative orbits. Moderate bilateral paranasal sinus mucosal thickening and opacification, possibly with periosteal thickening on series 10, image 5. Other: Mastoids are clear. Grossly normal visible internal auditory structures. Negative visible scalp and face. IMPRESSION: Widespread, bilateral abnormal cortical diffusion and FLAIR signal in a nonspecific pattern. Suspect asymmetric caudate nucleus involvement also on the left. But white matter seems spared, there is no associated abnormal enhancement, and no intracranial mass effect. Broad differential diagnosis at this point and recommend Neurology consultation with consideration of: Infectious Meningoencephalitis (although lack of enhancement argues against), Reversible cerebral vasoconstriction (RCVS), Urea cycle disorders/Uremia, Hypoglycemia, Seizure disorder, PRES (although sparing of white matter argues against), Hepatic encephalopathy, Mitochondrial disorder, and Creutzfeldt-jakob disease. Electronically Signed   By: Odessa Fleming M.D.   On: 11/02/2022 05:34      Assessment/Plan Principal  Problem:   Acute metabolic encephalopathy Active Problems:   Hypokalemia   Anxiety and depression   Overweight with body mass index (BMI) of 28 to 28.9 in adult   Assessment and Plan:   Acute metabolic encephalopathy: Etiology is not clear. MRI of brain showed widespread, bilateral abnormal cortical diffusion and FLAIR signal in a nonspecific pattern.  Differential diagnosis is very broad.  Patient negative UDS.  Consulted Dr. Amada Jupiter, who thinks patient will need an LP. Since pt's mother is not here, cannot get consent. Dr. Amada Jupiter will do LP tomorrow.   -Admitted to telemetry bed as inpatient -Frequent neurocheck -Fall precaution -labs per Dr. Amada Jupiter:  RPR, HIV, TSH, B12, B1, antithyroid antibodies, ANA, serum arbovirus panel, serum autoimmune encephalitis panel, heavy metal screen, Lyme titers  -CTA of head and neck  Hypokalemia: Potassium 3.1,g 2.3 -Repleted potassium  Anxiety and depression: Patient is not taking Lexapro -Hold Xanax due to altered mental status  Overweight with body mass index (BMI) of 28 to 28.9 in adult: Body weight 78.2 kg,  BNI 28.24 -Patient needs exercise and healthy diet -Patient need to be encouraged to lose weight when mental status improves     DVT ppx: SCD  Code Status: Full code   Family Communication: Yes, patient's husband and mother  at bed side.   Disposition Plan:  Anticipate discharge back to previous environment  Consults called: Dr. Amada Jupiter of neurology is consulted. LCAS-A of BH, Hand, Jamila is consuted.   Admission status and Level of care: Telemetry Medical:  as inpt   Dispo: The patient is from: Home              Anticipated d/c is to: Home              Anticipated d/c date is: 2 days              Patient currently is not medically stable to d/c.    Severity of Illness:  The appropriate patient status for this patient is INPATIENT. Inpatient status is judged to be reasonable and necessary in order  to provide the required intensity of service to ensure the patient's safety. The patient's presenting symptoms, physical exam findings, and initial radiographic and laboratory data in the context of their chronic comorbidities is felt to place them at high risk for further clinical deterioration. Furthermore, it is not anticipated that the patient will be medically stable for discharge from the hospital within 2 midnights of admission.   * I certify that at the point of admission it is my clinical judgment that the patient will require inpatient hospital care spanning beyond 2 midnights from  the point of admission due to high intensity of service, high risk for further deterioration and high frequency of surveillance required.*     Date of Service 11/02/2022    Lorretta Harp [facility]   If 7PM-7AM, please contact night-coverage [email or web address] 11/02/2022, 7:05 PM

## 2022-11-02 NOTE — ED Notes (Signed)
Family at bedside, pt resting on her left side, resp even and unlabored

## 2022-11-03 ENCOUNTER — Inpatient Hospital Stay (HOSPITAL_COMMUNITY)
Admit: 2022-11-03 | Discharge: 2022-11-03 | Disposition: A | Discharge status: Home / Self Care | Payer: BC Managed Care – PPO | Attending: Neurology | Admitting: Neurology

## 2022-11-03 ENCOUNTER — Encounter (HOSPITAL_COMMUNITY): Payer: Self-pay

## 2022-11-03 ENCOUNTER — Inpatient Hospital Stay (HOSPITAL_COMMUNITY)
Admission: EM | Admit: 2022-11-03 | Discharge: 2022-11-16 | DRG: 056 | Disposition: A | Discharge status: Hospice | Payer: BC Managed Care – PPO | Source: Other Acute Inpatient Hospital | Attending: Internal Medicine | Admitting: Internal Medicine

## 2022-11-03 ENCOUNTER — Inpatient Hospital Stay: Payer: BC Managed Care – PPO | Admitting: Radiology

## 2022-11-03 ENCOUNTER — Encounter (HOSPITAL_COMMUNITY): Payer: Self-pay | Admitting: Family Medicine

## 2022-11-03 DIAGNOSIS — F419 Anxiety disorder, unspecified: Secondary | ICD-10-CM | POA: Diagnosis present

## 2022-11-03 DIAGNOSIS — F32A Depression, unspecified: Secondary | ICD-10-CM | POA: Diagnosis present

## 2022-11-03 DIAGNOSIS — G9340 Encephalopathy, unspecified: Secondary | ICD-10-CM

## 2022-11-03 DIAGNOSIS — R32 Unspecified urinary incontinence: Secondary | ICD-10-CM | POA: Diagnosis not present

## 2022-11-03 DIAGNOSIS — G9341 Metabolic encephalopathy: Secondary | ICD-10-CM | POA: Diagnosis not present

## 2022-11-03 DIAGNOSIS — E876 Hypokalemia: Secondary | ICD-10-CM | POA: Diagnosis present

## 2022-11-03 DIAGNOSIS — Z638 Other specified problems related to primary support group: Secondary | ICD-10-CM | POA: Diagnosis not present

## 2022-11-03 DIAGNOSIS — Z6825 Body mass index (BMI) 25.0-25.9, adult: Secondary | ICD-10-CM

## 2022-11-03 DIAGNOSIS — Z79899 Other long term (current) drug therapy: Secondary | ICD-10-CM

## 2022-11-03 DIAGNOSIS — Z90711 Acquired absence of uterus with remaining cervical stump: Secondary | ICD-10-CM

## 2022-11-03 DIAGNOSIS — Z91048 Other nonmedicinal substance allergy status: Secondary | ICD-10-CM

## 2022-11-03 DIAGNOSIS — J309 Allergic rhinitis, unspecified: Secondary | ICD-10-CM | POA: Diagnosis present

## 2022-11-03 DIAGNOSIS — W57XXXA Bitten or stung by nonvenomous insect and other nonvenomous arthropods, initial encounter: Secondary | ICD-10-CM | POA: Diagnosis present

## 2022-11-03 DIAGNOSIS — Z66 Do not resuscitate: Secondary | ICD-10-CM | POA: Diagnosis not present

## 2022-11-03 DIAGNOSIS — A8100 Creutzfeldt-Jakob disease, unspecified: Secondary | ICD-10-CM | POA: Diagnosis present

## 2022-11-03 DIAGNOSIS — A86 Unspecified viral encephalitis: Secondary | ICD-10-CM | POA: Diagnosis not present

## 2022-11-03 DIAGNOSIS — E43 Unspecified severe protein-calorie malnutrition: Secondary | ICD-10-CM | POA: Diagnosis present

## 2022-11-03 DIAGNOSIS — R569 Unspecified convulsions: Secondary | ICD-10-CM | POA: Diagnosis not present

## 2022-11-03 DIAGNOSIS — Z515 Encounter for palliative care: Secondary | ICD-10-CM

## 2022-11-03 DIAGNOSIS — R4701 Aphasia: Secondary | ICD-10-CM | POA: Diagnosis present

## 2022-11-03 DIAGNOSIS — R4182 Altered mental status, unspecified: Secondary | ICD-10-CM | POA: Diagnosis not present

## 2022-11-03 DIAGNOSIS — Z7189 Other specified counseling: Secondary | ICD-10-CM | POA: Diagnosis not present

## 2022-11-03 DIAGNOSIS — R7303 Prediabetes: Secondary | ICD-10-CM | POA: Diagnosis present

## 2022-11-03 HISTORY — PX: IR LUMBAR PUNCTURE: IMG944

## 2022-11-03 LAB — CBC
HCT: 39.7 % (ref 36.0–46.0)
Hemoglobin: 13.3 g/dL (ref 12.0–15.0)
MCH: 30.2 pg (ref 26.0–34.0)
MCHC: 33.5 g/dL (ref 30.0–36.0)
MCV: 90.2 fL (ref 80.0–100.0)
Platelets: 260 10*3/uL (ref 150–400)
RBC: 4.4 MIL/uL (ref 3.87–5.11)
RDW: 12.7 % (ref 11.5–15.5)
WBC: 6.5 10*3/uL (ref 4.0–10.5)
nRBC: 0 % (ref 0.0–0.2)

## 2022-11-03 LAB — MENINGITIS/ENCEPHALITIS PANEL (CSF)

## 2022-11-03 LAB — CSF CELL COUNT WITH DIFFERENTIAL
Eosinophils, CSF: 0 %
Lymphs, CSF: 88 %
Monocyte-Macrophage-Spinal Fluid: 12 %
RBC Count, CSF: 0 /mm3 (ref 0–3)
Segmented Neutrophils-CSF: 0 %
Tube #: 3
WBC, CSF: 17 /mm3 (ref 0–5)

## 2022-11-03 LAB — BASIC METABOLIC PANEL
Anion gap: 7 (ref 5–15)
BUN: 9 mg/dL (ref 6–20)
CO2: 24 mmol/L (ref 22–32)
Calcium: 9 mg/dL (ref 8.9–10.3)
Chloride: 107 mmol/L (ref 98–111)
Creatinine, Ser: 0.61 mg/dL (ref 0.44–1.00)
GFR, Estimated: >60 mL/min (ref >60)
Glucose, Bld: 115 mg/dL — ABNORMAL HIGH (ref 70–99)
Potassium: 3 mmol/L — ABNORMAL LOW (ref 3.5–5.1)
Sodium: 138 mmol/L (ref 135–145)

## 2022-11-03 LAB — SEDIMENTATION RATE: Sed Rate: 10 mm/hr (ref 0–30)

## 2022-11-03 LAB — PROTEIN AND GLUCOSE, CSF
Glucose, CSF: 70 mg/dL (ref 40–70)
Total  Protein, CSF: 41 mg/dL (ref 15–45)

## 2022-11-03 LAB — RPR: RPR Ser Ql: NONREACTIVE

## 2022-11-03 LAB — GLUCOSE, CAPILLARY: Glucose-Capillary: 87 mg/dL (ref 70–99)

## 2022-11-03 LAB — HIV ANTIBODY (ROUTINE TESTING W REFLEX): HIV Screen 4th Generation wRfx: NONREACTIVE

## 2022-11-03 LAB — VITAMIN B12: Vitamin B-12: 534 pg/mL (ref 180–914)

## 2022-11-03 MED ORDER — SODIUM CHLORIDE 0.9% FLUSH
3.0000 mL | Freq: Two times a day (BID) | INTRAVENOUS | Status: DC
  Administered 2022-11-03 – 2022-11-16 (×20): 3 mL via INTRAVENOUS

## 2022-11-03 MED ORDER — SENNOSIDES-DOCUSATE SODIUM 8.6-50 MG PO TABS: 1.0000 | ORAL_TABLET | Freq: Every evening | ORAL | Status: DC | PRN

## 2022-11-03 MED ORDER — SODIUM CHLORIDE 0.9 % IV SOLN
100.0000 mg | Freq: Two times a day (BID) | INTRAVENOUS | Status: DC
  Administered 2022-11-03 – 2022-11-06 (×6): 100 mg via INTRAVENOUS
  Filled 2022-11-03 (×8): qty 100

## 2022-11-03 MED ORDER — LEVETIRACETAM 500 MG PO TABS: 1000.0000 mg | ORAL_TABLET | Freq: Two times a day (BID) | ORAL | Status: DC

## 2022-11-03 MED ORDER — ENOXAPARIN SODIUM 40 MG/0.4ML IJ SOSY
40.0000 mg | PREFILLED_SYRINGE | INTRAMUSCULAR | Status: DC
  Administered 2022-11-03 – 2022-11-04 (×2): 40 mg via SUBCUTANEOUS
  Filled 2022-11-03 (×2): qty 0.4

## 2022-11-03 MED ORDER — DOXYCYCLINE HYCLATE 100 MG PO TABS: 100.0000 mg | ORAL_TABLET | Freq: Two times a day (BID) | ORAL | Status: DC

## 2022-11-03 MED ORDER — DOXYCYCLINE HYCLATE 100 MG PO TABS: 100.0000 mg | ORAL_TABLET | Freq: Two times a day (BID) | ORAL | 0 refills | Status: DC

## 2022-11-03 MED ORDER — LIDOCAINE 1 % OPTIME INJ - NO CHARGE
5.0000 mL | Freq: Once | INTRAMUSCULAR | Status: DC
  Filled 2022-11-03: qty 6

## 2022-11-03 MED ORDER — POTASSIUM CHLORIDE IN NACL 20-0.9 MEQ/L-% IV SOLN
INTRAVENOUS | Status: DC
  Filled 2022-11-03: qty 1000

## 2022-11-03 MED ORDER — LORAZEPAM 2 MG/ML IJ SOLN
2.0000 mg | Freq: Once | INTRAMUSCULAR | Status: AC
  Administered 2022-11-03: 2 mg via INTRAVENOUS
  Filled 2022-11-03: qty 1

## 2022-11-03 MED ORDER — THIAMINE HCL 100 MG/ML IJ SOLN: 500.0000 mg | Freq: Three times a day (TID) | INTRAVENOUS | 0 refills | Status: DC

## 2022-11-03 MED ORDER — ACETAMINOPHEN 650 MG RE SUPP: 650.0000 mg | Freq: Four times a day (QID) | RECTAL | Status: DC | PRN

## 2022-11-03 MED ORDER — LEVETIRACETAM IN NACL 1000 MG/100ML IV SOLN: 1000.0000 mg | Freq: Two times a day (BID) | INTRAVENOUS | Status: DC

## 2022-11-03 MED ORDER — SODIUM CHLORIDE 0.9 % IV SOLN
2000.0000 mg | Freq: Once | INTRAVENOUS | Status: AC
  Administered 2022-11-03: 2000 mg via INTRAVENOUS
  Filled 2022-11-03: qty 20

## 2022-11-03 MED ORDER — LEVETIRACETAM IN NACL 1000 MG/100ML IV SOLN: 1000.0000 mg | Freq: Two times a day (BID) | INTRAVENOUS | 0 refills | Status: DC

## 2022-11-03 MED ORDER — LORAZEPAM 2 MG/ML IJ SOLN: 1.0000 mg | INTRAMUSCULAR | Status: DC

## 2022-11-03 MED ORDER — LORAZEPAM 2 MG/ML IJ SOLN
2.0000 mg | INTRAMUSCULAR | Status: AC
  Administered 2022-11-03: 2 mg via INTRAVENOUS
  Filled 2022-11-03: qty 1

## 2022-11-03 MED ORDER — LEVETIRACETAM IN NACL 1000 MG/100ML IV SOLN
1000.0000 mg | Freq: Two times a day (BID) | INTRAVENOUS | Status: DC
  Administered 2022-11-04 – 2022-11-05 (×3): 1000 mg via INTRAVENOUS
  Filled 2022-11-03 (×3): qty 100

## 2022-11-03 MED ORDER — POTASSIUM CHLORIDE 20 MEQ PO PACK
40.0000 meq | PACK | Freq: Once | ORAL | Status: DC
  Filled 2022-11-03: qty 2

## 2022-11-03 MED ORDER — ACETAMINOPHEN 325 MG PO TABS
650.0000 mg | ORAL_TABLET | Freq: Four times a day (QID) | ORAL | Status: DC | PRN
  Administered 2022-11-07: 650 mg via ORAL
  Filled 2022-11-03: qty 2

## 2022-11-03 NOTE — Progress Notes (Signed)
EEG complete - results pending 

## 2022-11-03 NOTE — Progress Notes (Addendum)
Subjective: I was able to talk with her boyfriend who was able to provide some ancillary history regarding the time course.  The first thing that he noticed was 3 weeks ago when she was appearing more tired than typical, and would take slightly longer to answer questions than typical, but she would just emphasize that she was very tired and did not think anything was going on.  Over the course of the next 3 weeks, she had progressive worsening confusion, asking him questions that she should have the answer to, thinking that her mother was in town when she was not.  This progressed to the point that he took her to the emergency department on July 2, where she was evaluated and felt that this was likely a psychiatric reaction to the tumultuos goings on in her life.  He then describes an episode where he was called by her neighbor as well as police were at her house because she was screaming "help me, someone is trying to murder me" from her balcony which was actually a message left on his voicemail.  This is finally what prompted him to contact her children and finally her husband to try and get her help.  No acute events overnight.   Exam: Vitals:   11/03/22 0549 11/03/22 0819  BP: 133/85 106/73  Pulse: (!) 103 89  Resp: 19 17  Temp: 98.4 F (36.9 C) 98.2 F (36.8 C)  SpO2: 100% 97%   Gen: In bed, NAD Resp: non-labored breathing, no acute distress Abd: soft, nt  Neuro: MS: She is awake, severe paucity of speech, but then she will have a very fluent sentence or two.  She does not answer direct questions very readily at all.  Her answers seem to be unreliable. CN: Pupils equal round and reactive, extraocular movements intact, visual fields full, face symmetric Motor: No drift in either upper extremity, able to give good resistance in bilateral lower extremities Sensory: Intact light touch DTR: 2+ and symmetric at the biceps and patella  Pertinent Labs: Results for orders placed or  performed during the hospital encounter of 11/01/22 (from the past 24 hour(s))  Magnesium     Status: None   Collection Time: 11/02/22  4:12 PM  Result Value Ref Range   Magnesium 2.3 1.7 - 2.4 mg/dL  Protime-INR     Status: None   Collection Time: 11/02/22  4:12 PM  Result Value Ref Range   Prothrombin Time 14.1 11.4 - 15.2 seconds   INR 1.1 0.8 - 1.2  APTT     Status: None   Collection Time: 11/02/22  4:12 PM  Result Value Ref Range   aPTT 26 24 - 36 seconds  HIV Antibody (routine testing w rflx)     Status: None   Collection Time: 11/02/22  4:12 PM  Result Value Ref Range   HIV Screen 4th Generation wRfx Non Reactive Non Reactive  RPR     Status: None   Collection Time: 11/02/22  5:04 PM  Result Value Ref Range   RPR Ser Ql NON REACTIVE NON REACTIVE  Vitamin B12     Status: None   Collection Time: 11/02/22  5:04 PM  Result Value Ref Range   Vitamin B-12 534 180 - 914 pg/mL  TSH     Status: None   Collection Time: 11/02/22  5:04 PM  Result Value Ref Range   TSH 2.862 0.350 - 4.500 uIU/mL  T4, free     Status: None  Collection Time: 11/02/22  5:04 PM  Result Value Ref Range   Free T4 1.04 0.61 - 1.12 ng/dL  Basic metabolic panel     Status: Abnormal   Collection Time: 11/03/22  4:24 AM  Result Value Ref Range   Sodium 138 135 - 145 mmol/L   Potassium 3.0 (L) 3.5 - 5.1 mmol/L   Chloride 107 98 - 111 mmol/L   CO2 24 22 - 32 mmol/L   Glucose, Bld 115 (H) 70 - 99 mg/dL   BUN 9 6 - 20 mg/dL   Creatinine, Ser 1.19 0.44 - 1.00 mg/dL   Calcium 9.0 8.9 - 14.7 mg/dL   GFR, Estimated >82 >95 mL/min   Anion gap 7 5 - 15  CBC     Status: None   Collection Time: 11/03/22  4:24 AM  Result Value Ref Range   WBC 6.5 4.0 - 10.5 K/uL   RBC 4.40 3.87 - 5.11 MIL/uL   Hemoglobin 13.3 12.0 - 15.0 g/dL   HCT 62.1 30.8 - 65.7 %   MCV 90.2 80.0 - 100.0 fL   MCH 30.2 26.0 - 34.0 pg   MCHC 33.5 30.0 - 36.0 g/dL   RDW 84.6 96.2 - 95.2 %   Platelets 260 150 - 400 K/uL   nRBC 0.0 0.0 -  0.2 %  Glucose, capillary     Status: None   Collection Time: 11/03/22  8:16 AM  Result Value Ref Range   Glucose-Capillary 87 70 - 99 mg/dL     Impression: 57 year old female with relatively abrupt onset severe confusion with cortical ribboning on MRI.  The differential is broad, and further evaluation is needed.    I attempted LP, but unfortunately was not successful.  She was cooperative for my attempt, but when going down for the fluoroscopy guided but she became very anxious and got up.  Following 2 mg of Ativan, she was able to cooperate.  Possibilities include encephalitis, autoimmune or infectious, though infectious is slightly less likely given no fever.  Her EEG was not definitive for seizure, but did show signs of irritability, and especially given her clinical picture I do think continuous EEG would be necessary at this time.  With only 3 weeks of symptoms, I think CJD to be very unlikely.  Recommendations: 1) appreciate ID assistance 2) follow-up B1, antithyroid antibodies, ANA, serum autoimmune encephalitis panel, ANCA, heavy metals, Lyme titers, serum arbovirus panel 3) LP for cells, protein, glucose, culture, fungal culture, arbovirus panel, autoimmune CSF profile. 4) continue high-dose thiamine 5) start Keppra 1 g twice daily after 2 g load 6) she will need transfer for LTM EEG  Ritta Slot, MD [facility] [phone]  If 7pm- 7am, please page neurology on call as listed in AMION.

## 2022-11-03 NOTE — Procedures (Signed)
History: 57-year-old female with a history of altered mental status the past 3 weeks.  Sedation: None  Technique: This EEG was acquired with electrodes placed according to the International 10-20 electrode system (including Fp1, Fp2, F3, F4, C3, C4, P3, P4, O1, O2, T3, T4, T5, T6, A1, A2, Fz, Cz, Pz). The following electrodes were missing or displaced: none.   Background: There is a posterior dominant rhythm of 8 to 9 Hz which is seen.  In addition, there are frequent generalized periodic discharges with a frontal predominance and triphasic morphology.  Though these do have a characteristic triphasic morphology and distribution, there is a waxing/waning in nature with variance in frequency from 1 to 2 Hz, though no definite evolution.  There is also generalized irregular delta and theta activity intruding into the background.  Photic stimulation: Physiologic driving is not performed  EEG Abnormalities: 1) generalized periodic discharges with triphasic morphology and waxing/waning frequency 2) generalized irregular slow activity  Clinical Interpretation: This EEG recorded a pattern of the ictal-interictal continuum consisting of generalized discharges with triphasic morphology.  This pattern is typically felt to be less likely to be ictal, the waxing/waning nature does give it a slightly unusual appearance and I would favor continuous EEG monitoring to assess for sleep/wake cycle modification, as well as to look for intermittent more clearly defined seizures.  No definite seizure was recorded.  Ritta Slot, MD [facility] [phone]  If 7pm- 7am, please page neurology on call as listed in [facility].

## 2022-11-03 NOTE — Discharge Summary (Signed)
Physician Discharge Summary   Patient: Jody Nolan MRN: [id number] DOB: 28-May-1965  Admit date:     11/01/2022  Discharge date: 11/03/22 transfer to [place]  Discharge Physician: Enedina Finner   PCP: Jacky Kindle, FNP   Discharge Diagnoses: Principal Problem:   Acute metabolic encephalopathy Active Problems:   Hypokalemia   Anxiety and depression   Overweight with body mass index (BMI) of 28 to 28.9 in adult  Jody Nolan is a 57 y.o. 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  Per records h/o tick bite recently   MRI-brain w/ and w/o contrast Widespread, bilateral abnormal cortical diffusion and FLAIR signal in a nonspecific pattern. Suspect asymmetric caudate nucleus involvement also on the left. But white matter seems spared, there is no associated abnormal enhancement, and no intracranial mass effect. Broad differential diagnosis at this point and recommend Neurology consultation with consideration of: Infectious Meningoencephalitis (although lack of enhancement argues against), Reversible cerebral vasoconstriction (RCVS), Urea cycle disorders/Uremia, Hypoglycemia, Seizure disorder, PRES (although sparing of white matter argues against), Hepatic encephalopathy, Mitochondria l disorder, and Creutzfeldt-jakob disease.   Acute metabolic encephalopathy: Etiology is not clear.  ??seizure --MRI of brain showed widespread, bilateral abnormal cortical diffusion and FLAIR signal in a nonspecific pattern.  Differential diagnosis is very broad.  Patient negative UDS.  -- Consulted Dr. Amada Jupiter, who thinks patient will need an LP.--unable to do at bedside due to pt's anxiety --ID consulted -labs per Dr. Amada Jupiter:  RPR, HIV, TSH, B12, B1, antithyroid antibodies, ANA, serum arbovirus  panel, serum autoimmune encephalitis panel, heavy metal screen, Lyme titers  -CTA of head and neck noted--noting acute --given h/o tick bite--started Doxycycline--pt took it  --as per d/w dr Felipe Drone to [facility] for continuous EEG monitoring --started on IV keppra after loading dose --LP done by IR today   Hypokalemia: Potassium 3.1 -Repleted potassium   Anxiety and depression: Patient is not taking Lexapro  Dr. Amada Jupiter has discussed with patient's family. Will transfer for continuous EEG monitoring further workup for abnormal MRI/acute confusion/altered mental status.      Consultants: ID, Neurology Procedures performed: LP by IR  Disposition:  [place] Diet recommendation:  Discharge Diet Orders (From admission, onward)     Start     Ordered   11/03/22 0000  Diet - low sodium heart healthy        11/03/22 1725            DISCHARGE MEDICATION: Allergies as of 11/03/2022       Reactions   Adhesive [tape] Rash   Bandaids cause rash        Medication List     STOP taking these medications    escitalopram 20 MG tablet Commonly known as: LEXAPRO   phentermine 37.5 MG capsule   Semaglutide-Weight Management 2.4 MG/0.75ML Soaj       TAKE these medications    ALPRAZolam 0.25 MG tablet Commonly known as: XANAX Take 1 tablet (0.25 mg total) by mouth daily.   doxycycline 100 MG tablet Commonly known as: VIBRA-TABS Take 1 tablet (100 mg total) by mouth every 12 (twelve) hours.   fluticasone 50 MCG/ACT nasal spray Commonly known as: FLONASE Place 2 sprays into both nostrils in the morning and at bedtime.   levETIRAcetam 1000 MG/100ML Soln Commonly known as: KEPPRA Inject 100 mLs (1,000 mg total) into the vein every  12 (twelve) hours. Start taking on: November 04, 2022   montelukast 10 MG tablet Commonly known as: SINGULAIR TAKE 1 TABLET BY MOUTH EVERYDAY AT BEDTIME   sodium chloride 0.9 % SOLN 50 mL with thiamine 100 MG/ML SOLN  500 mg Inject 500 mg into the vein 3 (three) times daily.   valACYclovir 1000 MG tablet Commonly known as: VALTREX TAKE 1 TABLET (1,000 MG TOTAL) BY MOUTH 2 (TWO) TIMES DAILY. TAKE 2 TABLETS TWICE DAILY FOR 5-7 DAYS FOR CURRENT FLARE What changed: See the new instructions.          Condition at discharge: fair  The results of significant diagnostics from this hospitalization (including imaging, microbiology, ancillary and laboratory) are listed below for reference.   Imaging Studies: CT ANGIO HEAD NECK W WO CM  Result Date: 11/02/2022 CLINICAL DATA:  Altered mental status. EXAM: CT ANGIOGRAPHY HEAD AND NECK WITH AND WITHOUT CONTRAST TECHNIQUE: Multidetector CT imaging of the head and neck was performed using the standard protocol during bolus administration of intravenous contrast. Multiplanar CT image reconstructions and MIPs were obtained to evaluate the vascular anatomy. Carotid stenosis measurements (when applicable) are obtained utilizing NASCET criteria, using the distal internal carotid diameter as the denominator. RADIATION DOSE REDUCTION: This exam was performed according to the departmental dose-optimization program which includes automated exposure control, adjustment of the mA and/or kV according to patient size and/or use of iterative reconstruction technique. CONTRAST:  75mL OMNIPAQUE IOHEXOL 350 MG/ML SOLN COMPARISON:  Same day brain MRI FINDINGS: CT HEAD FINDINGS Brain: There is no acute intracranial hemorrhage, extra-axial fluid collection, or acute large vessel territorial infarct. The areas of diffusion restriction seen on the same-day brain MRI are not well appreciated on the current study. Parenchymal volume is normal. The ventricles are normal in size. Pituitary and suprasellar region are normal. There is no mass lesion. There is no mass effect or midline shift. Vascular: See below. Skull: Normal. Negative for fracture or focal lesion. Sinuses/Orbits: There is mild mucosal  thickening in the paranasal sinuses with hyperostosis of the maxillary sinus walls consistent with chronic sinusitis. The globes and orbits are unremarkable. Other: The mastoid air cells and middle ear cavities are clear. Review of the MIP images confirms the above findings CTA NECK FINDINGS Aortic arch: The imaged aortic arch is normal. The origins of the major branch vessels are patent. The subclavian arteries are patent to the level imaged. Right carotid system: The right common, internal, and external carotid arteries are patent, without hemodynamically significant stenosis or occlusion. There is no evidence of dissection or aneurysm. Left carotid system: The left common, internal, and external carotid arteries are patent, without hemodynamically significant stenosis or occlusion. There is a beaded appearance of the internal carotid artery (14-20). 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 Vertebral arteries: The vertebral arteries are patent, without hemodynamically significant stenosis or occlusion there is no evidence of dissection or aneurysm. Skeleton: There is no acute osseous abnormality or suspicious osseous lesion. There is mild disc space narrowing degenerative endplate change C5-C6 and C6-C7. There is no visible canal hematoma. Other neck: The soft tissues of the neck are unremarkable. Upper chest: There is a small calcified granuloma in the right apex. The imaged lung apices are otherwise clear. Review of the MIP images confirms the above findings CTA HEAD FINDINGS Anterior circulation: The intracranial ICAs are patent, without significant stenosis or occlusion. The bilateral MCAs and ACAS  are patent, without proximal stenosis or occlusion. The anterior communicating artery is normal. There is no aneurysm or AVM. Posterior circulation: The bilateral V4 segments are patent. The basilar artery is patent.  The major cerebellar arteries appear patent. The bilateral PCAs are patent, without proximal stenosis or occlusion. Right larger than left posterior communicating arteries are identified. There is no aneurysm or AVM. Venous sinuses: Choose Anatomic variants: None. Review of the MIP images confirms the above findings IMPRESSION: 1. Patent vasculature of the head and neck without hemodynamically significant or occlusion. No evidence of vasospasm or RCVS in the intracranial vasculature. 2. 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 Electronically Signed   By: Lesia Hausen M.D.   On: 11/02/2022 18:36   MR Brain W and Wo Contrast  Result Date: 11/02/2022 CLINICAL DATA:  57 year old female with recent change in behavior, altered mental status, disoriented. EXAM: MRI HEAD WITHOUT AND WITH CONTRAST TECHNIQUE: Multiplanar, multiecho pulse sequences of the brain and surrounding structures were obtained without and with intravenous contrast. CONTRAST:  7mL GADAVIST GADOBUTROL 1 MMOL/ML IV SOLN COMPARISON:  None Available. FINDINGS: Brain: Normal cerebral volume. No midline shift or evidence of intracranial mass effect. However, there is multifocal bilateral abnormal cortical diffusion of the brain, most apparent in the parietal lobes and greater on the left (series 5 images 33 and 36), but also affecting the posterolateral left temporal lobe (image 21). See also coronal DWI images 28 and 29. There is associated cortical FLAIR hyperintensity in some of those areas, but the underlying white matter seems spared. At the same time, there is similar asymmetric diffusion and FLAIR hyperintensity also in the left caudate nucleus (series 5 image 25). No mass effect associated in these areas. No T1 signal abnormality although rapid T1 imaging was  utilized to minimize motion. And furthermore, there could be abnormally exaggerated cortical diffusion throughout the anterior frontal lobes, versus artifact (series 5, image 35). Cortical FLAIR hyperintensity there also difficult to exclude. No abnormal enhancement identified. No dural thickening. No superimposed ventriculomegaly, ventricular debris, extra-axial collection or convincing acute intracranial hemorrhage. Cervicomedullary junction and pituitary are within normal limits. No hemosiderin or mineralization on T2* imaging. Outside of the left caudate nucleus the deep gray nuclei, brainstem and cerebellum appear negative. Vascular: Major intracranial vascular flow voids appear preserved. Skull and upper cervical spine: Negative visible cervical spine. Visualized bone marrow signal is within normal limits. Sinuses/Orbits: Disconjugate gaze but otherwise negative orbits. Moderate bilateral paranasal sinus mucosal thickening and opacification, possibly with periosteal thickening on series 10, image 5. Other: Mastoids are clear. Grossly normal visible internal auditory structures. Negative visible scalp and face. IMPRESSION: Widespread, bilateral abnormal cortical diffusion and FLAIR signal in a nonspecific pattern. Suspect asymmetric caudate nucleus involvement also on the left. But white matter seems spared, there is no associated abnormal enhancement, and no intracranial mass effect. Broad differential diagnosis at this point and recommend Neurology consultation with consideration of: Infectious Meningoencephalitis (although lack of enhancement argues against), Reversible cerebral vasoconstriction (RCVS), Urea cycle disorders/Uremia, Hypoglycemia, Seizure disorder, PRES (although sparing of white matter argues against), Hepatic encephalopathy, Mitochondrial disorder, and Creutzfeldt-jakob disease. Electronically Signed   By: Odessa Fleming M.D.   On: 11/02/2022 05:34    Microbiology: Results for orders placed or  performed in visit on 04/09/18  Urine Culture     Status: Abnormal  Collection Time: 04/09/18  4:45 PM   Specimen: Urine   URINE  Result Value Ref Range Status   Urine Culture, Routine Final report (A)  Final   Organism ID, Bacteria Escherichia coli (A)  Final    Comment: Greater than 100,000 colony forming units per mL Cefazolin <=4 ug/mL Cefazolin with an MIC <=16 predicts susceptibility to the oral agents cefaclor, cefdinir, cefpodoxime, cefprozil, cefuroxime, cephalexin, and loracarbef when used for therapy of uncomplicated urinary tract infections due to E. coli, Klebsiella pneumoniae, and Proteus mirabilis.    Antimicrobial Susceptibility Comment  Final    Comment:       ** S = Susceptible; I = Intermediate; R = Resistant **                    P = Positive; N = Negative             MICS are expressed in micrograms per mL    Antibiotic                 RSLT#1    RSLT#2    RSLT#3    RSLT#4 Amoxicillin/Clavulanic Acid    S Ampicillin                     S Cefepime                       S Ceftriaxone                    S Cefuroxime                     S Ciprofloxacin                  S Ertapenem                      S Gentamicin                     S Imipenem                       S Levofloxacin                   S Meropenem                      S Nitrofurantoin                 S Piperacillin/Tazobactam        S Tetracycline                   S Tobramycin                     S Trimethoprim/Sulfa             S     Labs: CBC: Recent Labs  Lab 11/01/22 1439 11/03/22 0424  WBC 5.9 6.5  HGB 13.7 13.3  HCT 41.7 39.7  MCV 90.7 90.2  PLT 280 260   Basic Metabolic Panel: Recent Labs  Lab 11/01/22 1439 11/02/22 1612 11/03/22 0424  NA 140  --  138  K 3.1*  --  3.0*  CL 106  --  107  CO2 26  --  24  GLUCOSE 105*  --  115*  BUN 16  --  9  CREATININE 0.67  --  0.61  CALCIUM 9.3  --  9.0  MG  --  2.3  --    Liver Function Tests: Recent Labs  Lab 11/01/22 1439   AST 33  ALT 30  ALKPHOS 40  BILITOT 1.1  PROT 7.1  ALBUMIN 4.5   CBG: Recent Labs  Lab 11/03/22 0816  GLUCAP 87    Discharge time spent: greater than 30 minutes.  Signed: Enedina Finner, MD [facility] 11/03/2022

## 2022-11-03 NOTE — Progress Notes (Signed)
[facility]  - [place] at [facility]   PATIENT NAME: Jody Nolan    MR#:  [id number]  DATE OF BIRTH:  10/22/1965  SUBJECTIVE:  patient's husband and mother at bedside. Patient gives pretty limited history due to her neurological state. Apparently came in with altered mental status ongoing for about two weeks. Chart reviewed and spoke with neurology as well. Workup for abnormal MRI/altered mental status. Patient has been refusing some oral meds. LP was unsuccessful this morning due to patient's anxiety and not able to really still. Patient does space out with blank stares. Not responding promptly. H/o tick bite few days ago   VITALS:  Blood pressure 106/73, pulse 89, temperature 98.2 F (36.8 C), resp. rate 17, height 5' 5.51" (1.664 m), SpO2 97%.  PHYSICAL EXAMINATION:  limited GENERAL:  57 y.o.-year-old patient with no acute distress.  LUNGS: Normal breath sounds bilaterally, no wheezing CARDIOVASCULAR: S1, S2 normal. No murmur   ABDOMEN: Soft, nontender, nondistended. Bowel sounds present.  EXTREMITIES: No  edema b/l.    NEUROLOGIC: nonfocal  patient is alert  howevere appears confused SKIN: No obvious rash, lesion, or ulcer.   LABORATORY PANEL:  CBC Recent Labs  Lab 11/03/22 0424  WBC 6.5  HGB 13.3  HCT 39.7  PLT 260    Chemistries  Recent Labs  Lab 11/01/22 1439 11/02/22 1612 11/03/22 0424  NA 140  --  138  K 3.1*  --  3.0*  CL 106  --  107  CO2 26  --  24  GLUCOSE 105*  --  115*  BUN 16  --  9  CREATININE 0.67  --  0.61  CALCIUM 9.3  --  9.0  MG  --  2.3  --   AST 33  --   --   ALT 30  --   --   ALKPHOS 40  --   --   BILITOT 1.1  --   --    Cardiac Enzymes No results for input(s): "TROPONINI" in the last 168 hours. RADIOLOGY:  CT ANGIO HEAD NECK W WO CM  Result Date: 11/02/2022 CLINICAL DATA:  Altered mental status. EXAM: CT ANGIOGRAPHY HEAD AND NECK WITH AND WITHOUT CONTRAST TECHNIQUE: Multidetector CT imaging of the head and neck  was performed using the standard protocol during bolus administration of intravenous contrast. Multiplanar CT image reconstructions and MIPs were obtained to evaluate the vascular anatomy. Carotid stenosis measurements (when applicable) are obtained utilizing NASCET criteria, using the distal internal carotid diameter as the denominator. RADIATION DOSE REDUCTION: This exam was performed according to the departmental dose-optimization program which includes automated exposure control, adjustment of the mA and/or kV according to patient size and/or use of iterative reconstruction technique. CONTRAST:  75mL OMNIPAQUE IOHEXOL 350 MG/ML SOLN COMPARISON:  Same day brain MRI FINDINGS: CT HEAD FINDINGS Brain: There is no acute intracranial hemorrhage, extra-axial fluid collection, or acute large vessel territorial infarct. The areas of diffusion restriction seen on the same-day brain MRI are not well appreciated on the current study. Parenchymal volume is normal. The ventricles are normal in size. Pituitary and suprasellar region are normal. There is no mass lesion. There is no mass effect or midline shift. Vascular: See below. Skull: Normal. Negative for fracture or focal lesion. Sinuses/Orbits: There is mild mucosal thickening in the paranasal sinuses with hyperostosis of the maxillary sinus walls consistent with chronic sinusitis. The globes and orbits are unremarkable. Other: The mastoid air cells and middle ear cavities are clear. Review  of the MIP images confirms the above findings CTA NECK FINDINGS Aortic arch: The imaged aortic arch is normal. The origins of the major branch vessels are patent. The subclavian arteries are patent to the level imaged. Right carotid system: The right common, internal, and external carotid arteries are patent, without hemodynamically significant stenosis or occlusion. There is no evidence of dissection or aneurysm. Left carotid system: The left common, internal, and external carotid  arteries are patent, without hemodynamically significant stenosis or occlusion. There is a beaded appearance of the internal carotid artery (14-20). 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 Vertebral arteries: The vertebral arteries are patent, without hemodynamically significant stenosis or occlusion there is no evidence of dissection or aneurysm. Skeleton: There is no acute osseous abnormality or suspicious osseous lesion. There is mild disc space narrowing degenerative endplate change C5-C6 and C6-C7. There is no visible canal hematoma. Other neck: The soft tissues of the neck are unremarkable. Upper chest: There is a small calcified granuloma in the right apex. The imaged lung apices are otherwise clear. Review of the MIP images confirms the above findings CTA HEAD FINDINGS Anterior circulation: The intracranial ICAs are patent, without significant stenosis or occlusion. The bilateral MCAs and ACAS are patent, without proximal stenosis or occlusion. The anterior communicating artery is normal. There is no aneurysm or AVM. Posterior circulation: The bilateral V4 segments are patent. The basilar artery is patent. The major cerebellar arteries appear patent. The bilateral PCAs are patent, without proximal stenosis or occlusion. Right larger than left posterior communicating arteries are identified. There is no aneurysm or AVM. Venous sinuses: Choose Anatomic variants: None. Review of the MIP images confirms the above findings IMPRESSION: 1. Patent vasculature of the head and neck without hemodynamically significant or occlusion. No evidence of vasospasm or RCVS in the intracranial vasculature. 2. 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 Electronically Signed   By: Lesia Hausen M.D.   On: 11/02/2022 18:36   MR Brain W and Wo Contrast  Result Date: 11/02/2022 CLINICAL DATA:  57 year old female with recent change in behavior, altered mental status, disoriented. EXAM: MRI HEAD WITHOUT AND WITH CONTRAST TECHNIQUE: Multiplanar, multiecho pulse sequences of the brain and surrounding structures were obtained without and with intravenous contrast. CONTRAST:  7mL GADAVIST GADOBUTROL 1 MMOL/ML IV SOLN COMPARISON:  None Available. FINDINGS: Brain: Normal cerebral volume. No midline shift or evidence of intracranial mass effect. However, there is multifocal bilateral abnormal cortical diffusion of the brain, most apparent in the parietal lobes and greater on the left (series 5 images 33 and 36), but also affecting the posterolateral left temporal lobe (image 21). See also coronal DWI images 28 and 29. There is associated cortical FLAIR hyperintensity in some of those areas, but the underlying white matter seems spared. At the same time, there is similar asymmetric diffusion and FLAIR hyperintensity also in the left caudate nucleus (series 5 image 25). No mass effect associated in these areas. No T1 signal abnormality although rapid T1 imaging was utilized to minimize motion. And furthermore, there could be abnormally exaggerated cortical diffusion throughout the anterior frontal lobes, versus artifact (series 5, image 35). Cortical FLAIR hyperintensity there also difficult to exclude. No abnormal enhancement identified. No dural thickening. No superimposed ventriculomegaly, ventricular debris, extra-axial  collection or convincing acute intracranial hemorrhage. Cervicomedullary junction and pituitary are within normal limits. No hemosiderin or mineralization on T2* imaging. Outside of the left caudate nucleus the deep gray nuclei, brainstem and cerebellum appear negative. Vascular: Major intracranial vascular flow voids appear preserved. Skull  and upper cervical spine: Negative visible cervical spine. Visualized bone marrow signal is within normal limits. Sinuses/Orbits: Disconjugate gaze but otherwise negative orbits. Moderate bilateral paranasal sinus mucosal thickening and opacification, possibly with periosteal thickening on series 10, image 5. Other: Mastoids are clear. Grossly normal visible internal auditory structures. Negative visible scalp and face. IMPRESSION: Widespread, bilateral abnormal cortical diffusion and FLAIR signal in a nonspecific pattern. Suspect asymmetric caudate nucleus involvement also on the left. But white matter seems spared, there is no associated abnormal enhancement, and no intracranial mass effect. Broad differential diagnosis at this point and recommend Neurology consultation with consideration of: Infectious Meningoencephalitis (although lack of enhancement argues against), Reversible cerebral vasoconstriction (RCVS), Urea cycle disorders/Uremia, Hypoglycemia, Seizure disorder, PRES (although sparing of white matter argues against), Hepatic encephalopathy, Mitochondrial disorder, and Creutzfeldt-jakob disease. Electronically Signed   By: Odessa Fleming M.D.   On: 11/02/2022 05:34    Assessment and Plan   Kaliyah Luallen is a 57 y.o. 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  Per records h/o tick bite recently  MRI-brain w/ and w/o contrast Widespread, bilateral abnormal cortical diffusion and FLAIR signal in a nonspecific pattern. Suspect asymmetric caudate nucleus involvement also on the left. But white matter seems spared, there is no associated abnormal enhancement, and no intracranial mass effect. Broad differential diagnosis at this point and recommend Neurology consultation with consideration of: Infectious  Meningoencephalitis (although lack of enhancement argues against), Reversible cerebral vasoconstriction (RCVS), Urea cycle disorders/Uremia, Hypoglycemia, Seizure disorder, PRES (although sparing of white matter argues against), Hepatic encephalopathy, Mitochondria l disorder, and Creutzfeldt-jakob disease.   Acute metabolic encephalopathy: Etiology is not clear.  ??seizure --MRI of brain showed widespread, bilateral abnormal cortical diffusion and FLAIR signal in a nonspecific pattern.  Differential diagnosis is very broad.  Patient negative UDS.  -- Consulted Dr. Amada Jupiter, who thinks patient will need an LP.--unable to do at bedside due to pt's anxiety --IR to try do LP  --ID consulted -Fall precaution -labs per Dr. Amada Jupiter:  RPR, HIV, TSH, B12, B1, antithyroid antibodies, ANA, serum arbovirus panel, serum autoimmune encephalitis panel, heavy metal screen, Lyme titers  -CTA of head and neck noted--noting acute --given h/o tick bite--started Doxycycline--pt took it after convincing her to take --as per d/w dr Felipe Drone to tertiary care center   Hypokalemia: Potassium 3.1 -Repleted potassium   Anxiety and depression: Patient is not taking Lexapro -Hold Xanax due to altered mental status        DVT ppx: SCD Code Status: Full code   Family Communication: Yes, patient's husband and mother  at bed side.   Disposition Plan:  TBD Procedures:Unsuccessful attempt for LP at bedside Consults : neurology, ID: Level of care: Telemetry Medical Status is: Inpatient Remains inpatient appropriate because: AMS    TOTAL TIME TAKING CARE OF THIS PATIENT: 35 minutes.  >50% time spent on counselling and coordination of care  Note: This dictation was prepared with Dragon dictation along with smaller phrase technology. Any transcriptional errors that result from this process are unintentional.  Enedina Finner M.D    [facility]   CC:  Primary care physician; Jacky Kindle, FNP

## 2022-11-03 NOTE — Procedures (Signed)
Interventional Radiology Procedure Note  Date of Procedure: 11/03/2022  Procedure: LP with fluoro   Findings:  1. Successful LP using fluoro at L4-L5  2. Opening pressure estimated at 12 cm H2O  3. Total of 22 ml of clear CSF collected. Note that the access needle had to repositioned during the 4th vial, and resulted with a slightly bloody tap. The first 3 vials collected had clear colorless CSF.   Complications: No immediate complications noted.   Estimated Blood Loss: minimal  Follow-up and Recommendations: 1. Bedrest 2 hours    Olive Bass, MD  Vascular & Interventional Radiology  11/03/2022 4:19 PM

## 2022-11-03 NOTE — Plan of Care (Signed)

## 2022-11-03 NOTE — H&P (Signed)
History and Physical    Jody Nolan ZOX:[id number] DOB: 21-Jan-1966 DOA: 11/03/2022  PCP: Jacky Kindle, FNP   Patient coming from: [facility]   Chief Complaint: AMS   HPI: Jody Nolan is a 57 y.o. female with medical history significant for depression, anxiety, degenerative disc disease, and prediabetes who presented to the [facility] ED on 11/01/2022 with at least 3 weeks of worsening mental status.  Patient had been living in [facility] where she was noted to have alteration in her mental status going back at least 3 weeks.  She had reportedly been seen in the hospital there where she had negative head CT and her condition was suspected to be secondary to underlying anxiety and stressful life events.    Patient's neighbor in [place] contacted the patient's husband and mother recently due to concern that the patient was confused and exhibiting odd behavior.  Family brought her back to the [place] area where she has appeared to have continue worsening in her mental status, speaking less.  She has not been speaking much at all at this point.  Of note, she was seen at an urgent care on 07/20/2022 after finding a tick on her back.  She was prescribed doxycycline.  [facility] ED and Hospital Course: Patient presented to [facility] ED on 11/01/2022 and was admitted to the hospitalist service with neurology consulting.  She had MRI brain with widespread bilateral abnormal cortical diffusion and FLAIR signal in a nonspecific pattern.  She underwent LP with fluid sent for analysis cyst.  Blood was sent for RPR, HIV, TSH, B12, B1, antithyroid antibodies, ANA, arbovirus panel, autoimmune encephalitis panel, heavy metal screening, and Lyme titers.  She was started on doxycycline and Keppra during the hospitalization and was seen by infectious disease today at [facility] (ID consult note not yet complete).  She had an abnormal EEG today with no definite seizure recorded.  Neurology recommended transfer to [facility] for continuous EEG.  Review of Systems:  ROS limited by patient's clinical condition.  Past Medical History:  Diagnosis Date   Allergic rhinitis    Degeneration of cervical intervertebral disc    DUB (dysfunctional uterine bleeding)    Family history of adverse reaction to anesthesia    Mothr - PONV   Family history of colon cancer    History of abnormal cervical Pap smear    History of chicken pox    Radiculopathy    Reaction, stress, acute    Shoulder pain     Past Surgical History:  Procedure Laterality Date   ABDOMINAL HYSTERECTOMY     partial   CESAREAN SECTION  2000   COLONOSCOPY WITH PROPOFOL N/A 03/29/2018   Procedure: COLONOSCOPY WITH BIOPSY;  Surgeon: Midge Minium, MD;  Location: [facility];  Service: Endoscopy;  Laterality: N/A;   LEEP  1990's   POLYPECTOMY N/A 03/29/2018   Procedure: POLYPECTOMY;  Surgeon: Midge Minium, MD;  Location: [facility];  Service: Endoscopy;  Laterality: N/A;    Social History:   reports that she has never smoked. She has never used smokeless tobacco. She reports current alcohol use of about 1.0 standard drink of alcohol per week. She reports that she does not use drugs.  Allergies  Allergen Reactions   Adhesive [Tape] Rash    Bandaids cause rash    Family History  Problem Relation Age of Onset   Breast cancer Mother 68   Colon cancer Father    Hypertension Brother    Stroke  Paternal Grandmother    Hypertension Paternal Grandmother    Breast cancer Maternal Aunt 42     Prior to Admission medications   Medication Sig Start Date End Date Taking? Authorizing Provider  ALPRAZolam (XANAX) 0.25 MG tablet Take 1 tablet (0.25 mg total) by mouth daily. 05/01/22   Jacky Kindle, FNP  doxycycline (VIBRA-TABS) 100 MG tablet Take 1 tablet (100 mg total) by mouth every 12 (twelve) hours. 11/03/22   Enedina Finner, MD  fluticasone (FLONASE) 50 MCG/ACT nasal spray Place 2 sprays into both nostrils in the morning and at  bedtime. Patient not taking: Reported on 11/02/2022 03/11/21   Jacky Kindle, FNP  levETIRAcetam (KEPPRA) 1000 MG/100ML SOLN Inject 100 mLs (1,000 mg total) into the vein every 12 (twelve) hours. 11/04/22   Enedina Finner, MD  montelukast (SINGULAIR) 10 MG tablet TAKE 1 TABLET BY MOUTH EVERYDAY AT BEDTIME 03/09/22   Merita Norton T, FNP  sodium chloride 0.9 % SOLN 50 mL with thiamine 100 MG/ML SOLN 500 mg Inject 500 mg into the vein 3 (three) times daily. 11/03/22   Enedina Finner, MD  valACYclovir (VALTREX) 1000 MG tablet TAKE 1 TABLET (1,000 MG TOTAL) BY MOUTH 2 (TWO) TIMES DAILY. TAKE 2 TABLETS TWICE DAILY FOR 5-7 DAYS FOR CURRENT FLARE Patient taking differently: Take 1,000 mg by mouth 2 (two) times daily as needed (FLARE). 08/08/22   Jacky Kindle, FNP    Physical Exam: Vitals:   11/03/22 1939  BP: 128/77  Pulse: 84  Resp: 19  Temp: 98.8 F (37.1 C)  TempSrc: Axillary  SpO2: 95%    Constitutional: NAD, no pallor or diaphoresis  Eyes: PERTLA, lids and conjunctivae normal ENMT: Mucous membranes are moist. Posterior pharynx clear of any exudate or lesions.   Neck: supple, no masses  Respiratory:  no wheezing, no crackles. No accessory muscle use.  Cardiovascular: S1 & S2 heard, regular rate and rhythm. No extremity edema.   Abdomen: No distension, no tenderness, soft. Bowel sounds active.  Musculoskeletal: no clubbing / cyanosis. No joint deformity upper and lower extremities.   Skin: no significant rashes, lesions, ulcers. Warm, dry, well-perfused. Neurologic: CN 2-12 grossly intact. Moving all extremities spontaneously. Somnolent.     Labs and Imaging on Admission: I have personally reviewed following labs and imaging studies  CBC: Recent Labs  Lab 11/01/22 1439 11/03/22 0424  WBC 5.9 6.5  HGB 13.7 13.3  HCT 41.7 39.7  MCV 90.7 90.2  PLT 280 260   Basic Metabolic Panel: Recent Labs  Lab 11/01/22 1439 11/02/22 1612 11/03/22 0424  NA 140  --  138  K 3.1*  --  3.0*  CL  106  --  107  CO2 26  --  24  GLUCOSE 105*  --  115*  BUN 16  --  9  CREATININE 0.67  --  0.61  CALCIUM 9.3  --  9.0  MG  --  2.3  --    GFR: Estimated Creatinine Clearance: 73 mL/min (by C-G formula based on SCr of 0.61 mg/dL). Liver Function Tests: Recent Labs  Lab 11/01/22 1439  AST 33  ALT 30  ALKPHOS 40  BILITOT 1.1  PROT 7.1  ALBUMIN 4.5   No results for input(s): "LIPASE", "AMYLASE" in the last 168 hours. Recent Labs  Lab 11/02/22 0628  AMMONIA <10   Coagulation Profile: Recent Labs  Lab 11/02/22 1612  INR 1.1   Cardiac Enzymes: No results for input(s): "CKTOTAL", "CKMB", "CKMBINDEX", "TROPONINI" in the last  168 hours. BNP (last 3 results) No results for input(s): "PROBNP" in the last 8760 hours. HbA1C: No results for input(s): "HGBA1C" in the last 72 hours. CBG: Recent Labs  Lab 11/03/22 0816  GLUCAP 87   Lipid Profile: No results for input(s): "CHOL", "HDL", "LDLCALC", "TRIG", "CHOLHDL", "LDLDIRECT" in the last 72 hours. Thyroid Function Tests: Recent Labs    11/02/22 1704  TSH 2.862  FREET4 1.04   Anemia Panel: Recent Labs    11/02/22 1704  VITAMINB12 534   Urine analysis:    Component Value Date/Time   COLORURINE YELLOW (A) 11/01/2022 1747   APPEARANCEUR CLEAR (A) 11/01/2022 1747   LABSPEC 1.024 11/01/2022 1747   PHURINE 6.0 11/01/2022 1747   GLUCOSEU NEGATIVE 11/01/2022 1747   HGBUR NEGATIVE 11/01/2022 1747   BILIRUBINUR NEGATIVE 11/01/2022 1747   BILIRUBINUR Negative 04/09/2018 1640   KETONESUR 5 (A) 11/01/2022 1747   PROTEINUR NEGATIVE 11/01/2022 1747   UROBILINOGEN 0.2 04/09/2018 1640   NITRITE NEGATIVE 11/01/2022 1747   LEUKOCYTESUR NEGATIVE 11/01/2022 1747   Sepsis Labs: @LABRCNTIP (procalcitonin:4,lacticidven:4) )No results found for this or any previous visit (from the past 240 hour(s)).   Radiological Exams on Admission: EEG adult  Result Date: 11/03/2022 Rejeana Brock, MD     11/03/2022  8:15 PM History:  33-year-old female with a history of altered mental status the past 3 weeks. Sedation: None Technique: This EEG was acquired with electrodes placed according to the International 10-20 electrode system (including Fp1, Fp2, F3, F4, C3, C4, P3, P4, O1, O2, T3, T4, T5, T6, A1, A2, Fz, Cz, Pz). The following electrodes were missing or displaced: none. Background: There is a posterior dominant rhythm of 8 to 9 Hz which is seen.  In addition, there are frequent generalized periodic discharges with a frontal predominance and triphasic morphology.  Though these do have a characteristic triphasic morphology and distribution, there is a waxing/waning in nature with variance in frequency from 1 to 2 Hz, though no definite evolution.  There is also generalized irregular delta and theta activity intruding into the background. Photic stimulation: Physiologic driving is not performed EEG Abnormalities: 1) generalized periodic discharges with triphasic morphology and waxing/waning frequency 2) generalized irregular slow activity Clinical Interpretation: This EEG recorded a pattern of the ictal-interictal continuum consisting of generalized discharges with triphasic morphology.  This pattern is typically felt to be less likely to be ictal, the waxing/waning nature does give it a slightly unusual appearance and I would favor continuous EEG monitoring to assess for sleep/wake cycle modification, as well as to look for intermittent more clearly defined seizures.  No definite seizure was recorded. Ritta Slot, MD [facility] [phone] If 7pm- 7am, please page neurology on call as listed in AMION.   CT ANGIO HEAD NECK W WO CM  Result Date: 11/02/2022 CLINICAL DATA:  Altered mental status. EXAM: CT ANGIOGRAPHY HEAD AND NECK WITH AND WITHOUT CONTRAST TECHNIQUE: Multidetector CT imaging of the head and neck was performed using the standard protocol during bolus administration of intravenous contrast. Multiplanar CT  image reconstructions and MIPs were obtained to evaluate the vascular anatomy. Carotid stenosis measurements (when applicable) are obtained utilizing NASCET criteria, using the distal internal carotid diameter as the denominator. RADIATION DOSE REDUCTION: This exam was performed according to the departmental dose-optimization program which includes automated exposure control, adjustment of the mA and/or kV according to patient size and/or use of iterative reconstruction technique. CONTRAST:  75mL OMNIPAQUE IOHEXOL 350 MG/ML SOLN COMPARISON:  Same day brain MRI FINDINGS:  CT HEAD FINDINGS Brain: There is no acute intracranial hemorrhage, extra-axial fluid collection, or acute large vessel territorial infarct. The areas of diffusion restriction seen on the same-day brain MRI are not well appreciated on the current study. Parenchymal volume is normal. The ventricles are normal in size. Pituitary and suprasellar region are normal. There is no mass lesion. There is no mass effect or midline shift. Vascular: See below. Skull: Normal. Negative for fracture or focal lesion. Sinuses/Orbits: There is mild mucosal thickening in the paranasal sinuses with hyperostosis of the maxillary sinus walls consistent with chronic sinusitis. The globes and orbits are unremarkable. Other: The mastoid air cells and middle ear cavities are clear. Review of the MIP images confirms the above findings CTA NECK FINDINGS Aortic arch: The imaged aortic arch is normal. The origins of the major branch vessels are patent. The subclavian arteries are patent to the level imaged. Right carotid system: The right common, internal, and external carotid arteries are patent, without hemodynamically significant stenosis or occlusion. There is no evidence of dissection or aneurysm. Left carotid system: The left common, internal, and external carotid arteries are patent, without hemodynamically significant stenosis or occlusion. There is a beaded appearance of  the internal carotid artery (14-20). 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 Vertebral arteries: The vertebral arteries are patent, without hemodynamically significant stenosis or occlusion there is no evidence of dissection or aneurysm. Skeleton: There is no acute osseous abnormality or suspicious osseous lesion. There is mild disc space narrowing degenerative endplate change C5-C6 and C6-C7. There is no visible canal hematoma. Other neck: The soft tissues of the neck are unremarkable. Upper chest: There is a small calcified granuloma in the right apex. The imaged lung apices are otherwise clear. Review of the MIP images confirms the above findings CTA HEAD FINDINGS Anterior circulation: The intracranial ICAs are patent, without significant stenosis or occlusion. The bilateral MCAs and ACAS are patent, without proximal stenosis or occlusion. The anterior communicating artery is normal. There is no aneurysm or AVM. Posterior circulation: The bilateral V4 segments are patent. The basilar artery is patent. The major cerebellar arteries appear patent. The bilateral PCAs are patent, without proximal stenosis or occlusion. Right larger than left posterior communicating arteries are identified. There is no aneurysm or AVM. Venous sinuses: Choose Anatomic variants: None. Review of the MIP images confirms the above findings IMPRESSION: 1. Patent vasculature of the head and neck without hemodynamically significant or occlusion. No evidence of vasospasm or RCVS in the intracranial vasculature. 2. 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 Electronically Signed   By: Lesia Hausen M.D.   On: 11/02/2022 18:36   MR Brain W and Wo  Contrast  Result Date: 11/02/2022 CLINICAL DATA:  57 year old female with recent change in behavior, altered mental status, disoriented. EXAM: MRI HEAD WITHOUT AND WITH CONTRAST TECHNIQUE: Multiplanar, multiecho pulse sequences of the brain and surrounding structures were obtained without and with intravenous contrast. CONTRAST:  7mL GADAVIST GADOBUTROL 1 MMOL/ML IV SOLN COMPARISON:  None Available. FINDINGS: Brain: Normal cerebral volume. No midline shift or evidence of intracranial mass effect. However, there is multifocal bilateral abnormal cortical diffusion of the brain, most apparent in the parietal lobes and greater on the left (series 5 images 33 and 36), but also  affecting the posterolateral left temporal lobe (image 21). See also coronal DWI images 28 and 29. There is associated cortical FLAIR hyperintensity in some of those areas, but the underlying white matter seems spared. At the same time, there is similar asymmetric diffusion and FLAIR hyperintensity also in the left caudate nucleus (series 5 image 25). No mass effect associated in these areas. No T1 signal abnormality although rapid T1 imaging was utilized to minimize motion. And furthermore, there could be abnormally exaggerated cortical diffusion throughout the anterior frontal lobes, versus artifact (series 5, image 35). Cortical FLAIR hyperintensity there also difficult to exclude. No abnormal enhancement identified. No dural thickening. No superimposed ventriculomegaly, ventricular debris, extra-axial collection or convincing acute intracranial hemorrhage. Cervicomedullary junction and pituitary are within normal limits. No hemosiderin or mineralization on T2* imaging. Outside of the left caudate nucleus the deep gray nuclei, brainstem and cerebellum appear negative. Vascular: Major intracranial vascular flow voids appear preserved. Skull and upper cervical spine: Negative visible cervical spine. Visualized bone marrow signal is within normal  limits. Sinuses/Orbits: Disconjugate gaze but otherwise negative orbits. Moderate bilateral paranasal sinus mucosal thickening and opacification, possibly with periosteal thickening on series 10, image 5. Other: Mastoids are clear. Grossly normal visible internal auditory structures. Negative visible scalp and face. IMPRESSION: Widespread, bilateral abnormal cortical diffusion and FLAIR signal in a nonspecific pattern. Suspect asymmetric caudate nucleus involvement also on the left. But white matter seems spared, there is no associated abnormal enhancement, and no intracranial mass effect. Broad differential diagnosis at this point and recommend Neurology consultation with consideration of: Infectious Meningoencephalitis (although lack of enhancement argues against), Reversible cerebral vasoconstriction (RCVS), Urea cycle disorders/Uremia, Hypoglycemia, Seizure disorder, PRES (although sparing of white matter argues against), Hepatic encephalopathy, Mitochondrial disorder, and Creutzfeldt-jakob disease. Electronically Signed   By: Odessa Fleming M.D.   On: 11/02/2022 05:34     Assessment/Plan   1. Acute encephalopathy  - Appreciate neurology and ID assessment and recommendations   - Follow-up pending CSF studies, blood tests, and continuous EEG results, use delirium precautions, continue Keppra and doxycycline for now    2. Hypokalemia  - Replacing, will repeat chem panel in am    3. Depression, anxiety  - Hold Xanax d/t somnolence    DVT prophylaxis: Lovenox  Code Status: Full  Level of Care: Level of care: Telemetry Medical Family Communication: Mother at bedside  Disposition Plan:  Patient is from: Home  Anticipated d/c is to: TBD Anticipated d/c date is: 11/06/22  Patient currently: Pending overnight EEG, CSF and blood tests  Consults called: Neurology  Admission status: Inpatient     Briscoe Deutscher, MD [facility]  11/03/2022, 9:18 PM

## 2022-11-03 NOTE — Consult Note (Addendum)
NAME: [name]  DOB: 1965/05/20  MRN: [id number]  Date/Time: 11/03/2022 9:28 PM  REQUESTING PROVIDER: Dr.[email or web address] Subjective:  REASON FOR CONSULT: encephalopathy to rule out any infectious cause ?No history available from patient. Chart reviewed, spoke to her mother and husband and with Dr.K Jody Nolan is a 57 y.o. female with a history of depression, anxiety, on weight loss meds presented to the ED brought in by her mother and estranged husband As per collective history and chart review pt lives in [place] in [place] - she is estranged from her husband but they are still  married He last saw her June 28th briefly and he thought she was okay then but thinking of it now she may have had signs of confusion Husband says she has lost 80 pounds over a course of a year or so and is on phentermine and semaglutide Dr.kirkpatrick spoke to her boyfriend in [place] and he told him that 3 weeks or so ago she appeared to be tired and took longer to answer questions. During the next three weeks she was confused and he took her to the ED on July 2nd and they thought it was a psychiatric reaction to her life events  Finally the family in [place] were  contacted by neighbor and her mother went to [employer] to bring her home. Her mom narrates that she walked out of her house that night and was gone for an hour and came back saying she was looking for her This prompted them to bring her to [facility] ED  Vitals in the ED  11/01/22 14:36  BP 144/83 !  Temp 98.7 F (37.1 C)  Pulse Rate 101 !  Resp 18  SpO2 97 %    Latest Reference Range & Units 11/01/22 14:39  WBC 4.0 - 10.5 K/uL 5.9  Hemoglobin 12.0 - 15.0 g/dL 86.5  HCT 78.4 - 69.6 % 41.7  Platelets 150 - 400 K/uL 280  Creatinine 0.44 - 1.00 mg/dL 2.95  CT head was normal MRI    Past Medical History:  Diagnosis Date   Allergic rhinitis    Degeneration of cervical intervertebral disc    DUB (dysfunctional uterine bleeding)    Family  history of adverse reaction to anesthesia    Mothr - PONV   Family history of colon cancer    History of abnormal cervical Pap smear    History of chicken pox    Radiculopathy    Reaction, stress, acute    Shoulder pain     Past Surgical History:  Procedure Laterality Date   ABDOMINAL HYSTERECTOMY     partial   CESAREAN SECTION  2000   COLONOSCOPY WITH PROPOFOL N/A 03/29/2018   Procedure: COLONOSCOPY WITH BIOPSY;  Surgeon: Midge Minium, MD;  Location: [facility];  Service: Endoscopy;  Laterality: N/A;   LEEP  1990's   POLYPECTOMY N/A 03/29/2018   Procedure: POLYPECTOMY;  Surgeon: Midge Minium, MD;  Location: [facility];  Service: Endoscopy;  Laterality: N/A;    Social History   Socioeconomic History   Marital status: Married    Spouse name: Not on file   Number of children: 2   Years of education: College Grad   Highest education level: Not on file  Occupational History   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: [employer]  Tobacco Use   Smoking status: Never   Smokeless tobacco: Never  Vaping Use   Vaping status:  Never Used  Substance and Sexual Activity   Alcohol use: Yes    Alcohol/week: 1.0 standard drink of alcohol    Types: 1 Standard drinks or equivalent per week   Drug use: No   Sexual activity: Yes  Other Topics Concern   Not on file  Social History Narrative   Not on file   Social Determinants of Health   Financial Resource Strain: Not on file  Food Insecurity: Patient Unable To Answer (11/02/2022)   Hunger Vital Sign    Worried About Running Out of Food in the Last Year: Patient unable to answer    Ran Out of Food in the Last Year: Patient unable to answer  Transportation Needs: Patient Unable To Answer (11/02/2022)   PRAPARE - Transportation    Lack of Transportation (Medical): Patient unable to answer    Lack of Transportation (Non-Medical): Patient unable to answer  Physical  Activity: Not on file  Stress: Not on file  Social Connections: Not on file  Intimate Partner Violence: Patient Unable To Answer (11/02/2022)   Humiliation, Afraid, Rape, and Kick questionnaire    Fear of Current or Ex-Partner: Patient unable to answer    Emotionally Abused: Patient unable to answer    Physically Abused: Patient unable to answer    Sexually Abused: Patient unable to answer    Family History  Problem Relation Age of Onset   Breast cancer Mother 81   Colon cancer Father    Hypertension Brother    Stroke Paternal Grandmother    Hypertension Paternal Grandmother    Breast cancer Maternal Aunt 42   Allergies  Allergen Reactions   Adhesive [Tape] Rash    Bandaids cause rash   I? Current Facility-Administered Medications  Medication Dose Route Frequency Provider Last Rate Last Admin   acetaminophen (TYLENOL) tablet 650 mg  650 mg Oral Q6H PRN Opyd, Lavone Neri, MD       Or   acetaminophen (TYLENOL) suppository 650 mg  650 mg Rectal Q6H PRN Opyd, Lavone Neri, MD       doxycycline (VIBRA-TABS) tablet 100 mg  100 mg Oral Q12H Opyd, Lavone Neri, MD       enoxaparin (LOVENOX) injection 40 mg  40 mg Subcutaneous Q24H Opyd, Lavone Neri, MD       levETIRAcetam (KEPPRA) tablet 1,000 mg  1,000 mg Oral BID Opyd, Lavone Neri, MD       senna-docusate (Senokot-S) tablet 1 tablet  1 tablet Oral QHS PRN Opyd, Lavone Neri, MD       sodium chloride flush (NS) 0.9 % injection 3 mL  3 mL Intravenous Q12H Opyd, Lavone Neri, MD         Abtx:  Anti-infectives (From admission, onward)    Start     Dose/Rate Route Frequency Ordered Stop   11/03/22 2200  doxycycline (VIBRA-TABS) tablet 100 mg        100 mg Oral Every 12 hours 11/03/22 2017         REVIEW OF SYSTEMS:  NA Objective:  VITALS:  BP 128/77 (BP Location: Left Arm)   Pulse 84   Temp 98.8 F (37.1 C) (Axillary)   Resp 19   SpO2 95%   PHYSICAL EXAM:  General: Awake, oriented in person, initially responded to questions hesitantly  but started perseverating and then mute  She followed commands initially and then stopped doing Head: Normocephalic, without obvious abnormality, atraumatic. Eyes: Conjunctivae clear, anicteric sclerae. Pupils are equal ENT Nares normal. No drainage or  sinus tenderness. Lips, mucosa, and tongue normal. No Thrush Neck: Supple, symmetrical, no adenopathy, thyroid: non tender no carotid bruit and no JVD. Back: No CVA tenderness. Lungs: Clear to auscultation bilaterally. No Wheezing or Rhonchi. No rales. Heart: Regular rate and rhythm, no murmur, rub or gallop. Abdomen: Soft, non-tender,not distended. Bowel sounds normal. No masses Extremities: atraumatic, no cyanosis. No edema. No clubbing Skin: No rashes or lesions. Or bruising Lymph: Cervical, supraclavicular normal. Neurologic: Grossly non-focal Pertinent Labs Lab Results CBC    Component Value Date/Time   WBC 6.5 11/03/2022 0424   RBC 4.40 11/03/2022 0424   HGB 13.3 11/03/2022 0424   HGB 15.2 05/01/2022 1454   HCT 39.7 11/03/2022 0424   HCT 44.6 05/01/2022 1454   PLT 260 11/03/2022 0424   PLT 354 05/01/2022 1454   MCV 90.2 11/03/2022 0424   MCV 90 05/01/2022 1454   MCV 84 03/14/2012 1215   MCH 30.2 11/03/2022 0424   MCHC 33.5 11/03/2022 0424   RDW 12.7 11/03/2022 0424   RDW 12.4 05/01/2022 1454   RDW 13.6 03/14/2012 1215   LYMPHSABS 1.7 03/12/2020 1439   EOSABS 0.2 03/12/2020 1439   BASOSABS 0.1 03/12/2020 1439       Latest Ref Rng & Units 11/03/2022    4:24 AM 11/01/2022    2:39 PM 05/01/2022    2:54 PM  CMP  Glucose 70 - 99 mg/dL 161  096  81   BUN 6 - 20 mg/dL 9  16  17    Creatinine 0.44 - 1.00 mg/dL 0.45  4.09  8.11   Sodium 135 - 145 mmol/L 138  140  143   Potassium 3.5 - 5.1 mmol/L 3.0  3.1  4.8   Chloride 98 - 111 mmol/L 107  106  102   CO2 22 - 32 mmol/L 24  26  23    Calcium 8.9 - 10.3 mg/dL 9.0  9.3  91.4   Total Protein 6.5 - 8.1 g/dL  7.1  7.0   Total Bilirubin 0.3 - 1.2 mg/dL  1.1  0.4   Alkaline  Phos 38 - 126 U/L  40  60   AST 15 - 41 U/L  33  16   ALT 0 - 44 U/L  30  12       IMAGING RESULTS: MRI- widespread bilateral abnormal cortical diffusion and FLAIR signal in a non specific pattern     I have personally reviewed the films ? Impression/Recommendation ?57 yr female  presenting with confusion, cognitive symptoms, psychiatric symptoms progressing over 3 weeks but no evidence of motor weakness or cranial nerve involvement, no fever or systemic illness MRI shows some changes  Clinically no evidence of bacterial meningitis or encephalitis like HSV or acute infectious encephalitis. She could have post infectious CNS manifestation or this could be NMDAR encephalitis or other autoimmune encephalitis Could it e RCVS? Or psychiatric issue? ( but MRI changes may go against it, but a repeat MRI to see whether the changes are persistent or temporary) There was a question of tick bite and hence she is on Doxy, though this is less likely  No need for any other antibiotics  EEG showed irritability so she is transferred to [facility] for continuous monitoring  Weight loss intentional on phentermine and semaglutide ___________________________________________________ Discussed with family and  requesting provider  P.S Csf wbc 17 Protein 41 ME panel negative So no evidence of treatable infection CSF has been sent for arbovirus, WNV, autoimmune encephalitis panel? ?  Note:  This document was prepared using Dragon voice recognition software and may include unintentional dictation errors.

## 2022-11-03 NOTE — Consult Note (Incomplete)
NAME: Jody Nolan  DOB: Sep 21, 1965  MRN: [id number]  Date/Time: 11/03/2022 10:48 AM  REQUESTING PROVIDER Subjective:  REASON FOR CONSULT:  ? Jody Nolan is a 57 y.o. with a history of   ID   Steroid/immune suppressants/splenectomy/Hardware Recent Procedure Surgery Injections Trauma Sick contacts Travel Antibiotic use Food- raw/exotic Animal bites Tick exposure Water sports Fishing/hunting/animal bird exposure Past Medical History:  Diagnosis Date   Allergic rhinitis    Degeneration of cervical intervertebral disc    DUB (dysfunctional uterine bleeding)    Family history of adverse reaction to anesthesia    Mothr - PONV   Family history of colon cancer    History of abnormal cervical Pap smear    History of chicken pox    Radiculopathy    Reaction, stress, acute    Shoulder pain     Past Surgical History:  Procedure Laterality Date   ABDOMINAL HYSTERECTOMY     partial   CESAREAN SECTION  2000   COLONOSCOPY WITH PROPOFOL N/A 03/29/2018   Procedure: COLONOSCOPY WITH BIOPSY;  Surgeon: Midge Minium, MD;  Location: [facility];  Service: Endoscopy;  Laterality: N/A;   LEEP  1990's   POLYPECTOMY N/A 03/29/2018   Procedure: POLYPECTOMY;  Surgeon: Midge Minium, MD;  Location: [facility];  Service: Endoscopy;  Laterality: N/A;    Social History   Socioeconomic History   Marital status: Married    Spouse name: Not on file   Number of children: 2   Years of education: College Grad   Highest education level: Not on file  Occupational History   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: [employer]  Tobacco Use   Smoking status: Never   Smokeless tobacco: Never  Vaping Use   Vaping status: Never Used  Substance and Sexual Activity   Alcohol use: Yes    Alcohol/week: 1.0 standard drink of alcohol    Types: 1 Standard drinks or equivalent per week   Drug use: No   Sexual activity: Yes  Other Topics  Concern   Not on file  Social History Narrative   Not on file   Social Determinants of Health   Financial Resource Strain: Not on file  Food Insecurity: Patient Unable To Answer (11/02/2022)   Hunger Vital Sign    Worried About Running Out of Food in the Last Year: Patient unable to answer    Ran Out of Food in the Last Year: Patient unable to answer  Transportation Needs: Patient Unable To Answer (11/02/2022)   PRAPARE - Transportation    Lack of Transportation (Medical): Patient unable to answer    Lack of Transportation (Non-Medical): Patient unable to answer  Physical Activity: Not on file  Stress: Not on file  Social Connections: Not on file  Intimate Partner Violence: Patient Unable To Answer (11/02/2022)   Humiliation, Afraid, Rape, and Kick questionnaire    Fear of Current or Ex-Partner: Patient unable to answer    Emotionally Abused: Patient unable to answer    Physically Abused: Patient unable to answer    Sexually Abused: Patient unable to answer    Family History  Problem Relation Age of Onset   Breast cancer Mother 47   Colon cancer Father    Hypertension Brother    Stroke Paternal Grandmother    Hypertension Paternal Grandmother    Breast cancer Maternal Aunt 42   Allergies  Allergen Reactions   Adhesive [Tape] Rash  Bandaids cause rash   I? Current Facility-Administered Medications  Medication Dose Route Frequency Provider Last Rate Last Admin   acetaminophen (TYLENOL) suppository 650 mg  650 mg Rectal Q6H PRN Lorretta Harp, MD       acetaminophen (TYLENOL) tablet 650 mg  650 mg Oral Q6H PRN Lorretta Harp, MD       doxycycline (VIBRA-TABS) tablet 100 mg  100 mg Oral Q12H Rejeana Brock, MD   100 mg at 11/03/22 1044   hydrALAZINE (APRESOLINE) injection 5 mg  5 mg Intravenous Q2H PRN Lorretta Harp, MD       ondansetron [facility]) injection 4 mg  4 mg Intravenous Q8H PRN Lorretta Harp, MD       thiamine (VITAMIN B1) 500 mg in sodium chloride 0.9 % 50 mL IVPB  500  mg Intravenous TID Rejeana Brock, MD   Stopped at 11/03/22 0217     Abtx:  Anti-infectives (From admission, onward)    Start     Dose/Rate Route Frequency Ordered Stop   11/02/22 2230  doxycycline (VIBRA-TABS) tablet 100 mg        100 mg Oral Every 12 hours 11/02/22 2138         REVIEW OF SYSTEMS:  Const: negative fever, negative chills, negative weight loss Eyes: negative diplopia or visual changes, negative eye pain ENT: negative coryza, negative sore throat Resp: negative cough, hemoptysis, dyspnea Cards: negative for chest pain, palpitations, lower extremity edema GU: negative for frequency, dysuria and hematuria GI: Negative for abdominal pain, diarrhea, bleeding, constipation Skin: negative for rash and pruritus Heme: negative for easy bruising and gum/nose bleeding MS: negative for myalgias, arthralgias, back pain and muscle weakness Neurolo:negative for headaches, dizziness, vertigo, memory problems  Psych: negative for feelings of anxiety, depression  Endocrine: negative for thyroid, diabetes Allergy/Immunology- negative for any medication or food allergies ? Pertinent Positives include : Objective:  VITALS:  BP 106/73 (BP Location: Left Arm)   Pulse 89   Temp 98.2 F (36.8 C)   Resp 17   SpO2 97%  LDA Foley Central line Other drainage tubes PHYSICAL EXAM:  General: Alert, cooperative, no distress, appears stated age.  Head: Normocephalic, without obvious abnormality, atraumatic. Eyes: Conjunctivae clear, anicteric sclerae. Pupils are equal ENT Nares normal. No drainage or sinus tenderness. Lips, mucosa, and tongue normal. No Thrush Neck: Supple, symmetrical, no adenopathy, thyroid: non tender no carotid bruit and no JVD. Back: No CVA tenderness. Lungs: Clear to auscultation bilaterally. No Wheezing or Rhonchi. No rales. Heart: Regular rate and rhythm, no murmur, rub or gallop. Abdomen: Soft, non-tender,not distended. Bowel sounds normal. No  masses Extremities: atraumatic, no cyanosis. No edema. No clubbing Skin: No rashes or lesions. Or bruising Lymph: Cervical, supraclavicular normal. Neurologic: Grossly non-focal Pertinent Labs Lab Results CBC    Component Value Date/Time   WBC 6.5 11/03/2022 0424   RBC 4.40 11/03/2022 0424   HGB 13.3 11/03/2022 0424   HGB 15.2 05/01/2022 1454   HCT 39.7 11/03/2022 0424   HCT 44.6 05/01/2022 1454   PLT 260 11/03/2022 0424   PLT 354 05/01/2022 1454   MCV 90.2 11/03/2022 0424   MCV 90 05/01/2022 1454   MCV 84 03/14/2012 1215   MCH 30.2 11/03/2022 0424   MCHC 33.5 11/03/2022 0424   RDW 12.7 11/03/2022 0424   RDW 12.4 05/01/2022 1454   RDW 13.6 03/14/2012 1215   LYMPHSABS 1.7 03/12/2020 1439   EOSABS 0.2 03/12/2020 1439   BASOSABS 0.1 03/12/2020 1439  Latest Ref Rng & Units 11/03/2022    4:24 AM 11/01/2022    2:39 PM 05/01/2022    2:54 PM  CMP  Glucose 70 - 99 mg/dL 469  629  81   BUN 6 - 20 mg/dL 9  16  17    Creatinine 0.44 - 1.00 mg/dL 5.28  4.13  2.44   Sodium 135 - 145 mmol/L 138  140  143   Potassium 3.5 - 5.1 mmol/L 3.0  3.1  4.8   Chloride 98 - 111 mmol/L 107  106  102   CO2 22 - 32 mmol/L 24  26  23    Calcium 8.9 - 10.3 mg/dL 9.0  9.3  01.0   Total Protein 6.5 - 8.1 g/dL  7.1  7.0   Total Bilirubin 0.3 - 1.2 mg/dL  1.1  0.4   Alkaline Phos 38 - 126 U/L  40  60   AST 15 - 41 U/L  33  16   ALT 0 - 44 U/L  30  12       Microbiology: No results found for this or any previous visit (from the past 240 hour(s)).  IMAGING RESULTS: I have personally reviewed the films ? Impression/Recommendation ? ? ? ___________________________________________________ Discussed with patient, requesting provider Note:  This document was prepared using Dragon voice recognition software and may include unintentional dictation errors.NAME: Jody Nolan  DOB: 09/30/1965  MRN: [id number]  Date/Time: 11/03/2022 10:48 AM  REQUESTING PROVIDER Subjective:  REASON FOR CONSULT:   ? Naomee Kienzle is a 57 y.o. with a history of   ID   Steroid/immune suppressants/splenectomy/Hardware Recent Procedure Surgery Injections Trauma Sick contacts Travel Antibiotic use Food- raw/exotic Animal bites Tick exposure Water sports Fishing/hunting/animal bird exposure Past Medical History:  Diagnosis Date   Allergic rhinitis    Degeneration of cervical intervertebral disc    DUB (dysfunctional uterine bleeding)    Family history of adverse reaction to anesthesia    Mothr - PONV   Family history of colon cancer    History of abnormal cervical Pap smear    History of chicken pox    Radiculopathy    Reaction, stress, acute    Shoulder pain     Past Surgical History:  Procedure Laterality Date   ABDOMINAL HYSTERECTOMY     partial   CESAREAN SECTION  2000   COLONOSCOPY WITH PROPOFOL N/A 03/29/2018   Procedure: COLONOSCOPY WITH BIOPSY;  Surgeon: Midge Minium, MD;  Location: [facility];  Service: Endoscopy;  Laterality: N/A;   LEEP  1990's   POLYPECTOMY N/A 03/29/2018   Procedure: POLYPECTOMY;  Surgeon: Midge Minium, MD;  Location: [facility];  Service: Endoscopy;  Laterality: N/A;    Social History   Socioeconomic History   Marital status: Married    Spouse name: Not on file   Number of children: 2   Years of education: College Grad   Highest education level: Not on file  Occupational History   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: [employer]  Tobacco Use   Smoking status: Never   Smokeless tobacco: Never  Vaping Use   Vaping status: Never Used  Substance and Sexual Activity   Alcohol use: Yes    Alcohol/week: 1.0 standard drink of alcohol    Types: 1 Standard drinks or equivalent per week   Drug use: No   Sexual activity: Yes  Other Topics Concern   Not on file  Social  History Narrative   Not on file   Social Determinants of Health   Financial Resource Strain: Not on file   Food Insecurity: Patient Unable To Answer (11/02/2022)   Hunger Vital Sign    Worried About Running Out of Food in the Last Year: Patient unable to answer    Ran Out of Food in the Last Year: Patient unable to answer  Transportation Needs: Patient Unable To Answer (11/02/2022)   PRAPARE - Transportation    Lack of Transportation (Medical): Patient unable to answer    Lack of Transportation (Non-Medical): Patient unable to answer  Physical Activity: Not on file  Stress: Not on file  Social Connections: Not on file  Intimate Partner Violence: Patient Unable To Answer (11/02/2022)   Humiliation, Afraid, Rape, and Kick questionnaire    Fear of Current or Ex-Partner: Patient unable to answer    Emotionally Abused: Patient unable to answer    Physically Abused: Patient unable to answer    Sexually Abused: Patient unable to answer    Family History  Problem Relation Age of Onset   Breast cancer Mother 75   Colon cancer Father    Hypertension Brother    Stroke Paternal Grandmother    Hypertension Paternal Grandmother    Breast cancer Maternal Aunt 42   Allergies  Allergen Reactions   Adhesive [Tape] Rash    Bandaids cause rash   I? Current Facility-Administered Medications  Medication Dose Route Frequency Provider Last Rate Last Admin   acetaminophen (TYLENOL) suppository 650 mg  650 mg Rectal Q6H PRN Lorretta Harp, MD       acetaminophen (TYLENOL) tablet 650 mg  650 mg Oral Q6H PRN Lorretta Harp, MD       doxycycline (VIBRA-TABS) tablet 100 mg  100 mg Oral Q12H Rejeana Brock, MD   100 mg at 11/03/22 1044   hydrALAZINE (APRESOLINE) injection 5 mg  5 mg Intravenous Q2H PRN Lorretta Harp, MD       ondansetron [facility]) injection 4 mg  4 mg Intravenous Q8H PRN Lorretta Harp, MD       thiamine (VITAMIN B1) 500 mg in sodium chloride 0.9 % 50 mL IVPB  500 mg Intravenous TID Rejeana Brock, MD   Stopped at 11/03/22 0217     Abtx:  Anti-infectives (From admission, onward)    Start      Dose/Rate Route Frequency Ordered Stop   11/02/22 2230  doxycycline (VIBRA-TABS) tablet 100 mg        100 mg Oral Every 12 hours 11/02/22 2138         REVIEW OF SYSTEMS:  Const: negative fever, negative chills, negative weight loss Eyes: negative diplopia or visual changes, negative eye pain ENT: negative coryza, negative sore throat Resp: negative cough, hemoptysis, dyspnea Cards: negative for chest pain, palpitations, lower extremity edema GU: negative for frequency, dysuria and hematuria GI: Negative for abdominal pain, diarrhea, bleeding, constipation Skin: negative for rash and pruritus Heme: negative for easy bruising and gum/nose bleeding MS: negative for myalgias, arthralgias, back pain and muscle weakness Neurolo:negative for headaches, dizziness, vertigo, memory problems  Psych: negative for feelings of anxiety, depression  Endocrine: negative for thyroid, diabetes Allergy/Immunology- negative for any medication or food allergies ? Pertinent Positives include : Objective:  VITALS:  BP 106/73 (BP Location: Left Arm)   Pulse 89   Temp 98.2 F (36.8 C)   Resp 17   SpO2 97%  LDA Foley Central line Other drainage tubes PHYSICAL EXAM:  General:  Alert, cooperative, no distress, appears stated age.  Head: Normocephalic, without obvious abnormality, atraumatic. Eyes: Conjunctivae clear, anicteric sclerae. Pupils are equal ENT Nares normal. No drainage or sinus tenderness. Lips, mucosa, and tongue normal. No Thrush Neck: Supple, symmetrical, no adenopathy, thyroid: non tender no carotid bruit and no JVD. Back: No CVA tenderness. Lungs: Clear to auscultation bilaterally. No Wheezing or Rhonchi. No rales. Heart: Regular rate and rhythm, no murmur, rub or gallop. Abdomen: Soft, non-tender,not distended. Bowel sounds normal. No masses Extremities: atraumatic, no cyanosis. No edema. No clubbing Skin: No rashes or lesions. Or bruising Lymph: Cervical, supraclavicular  normal. Neurologic: Grossly non-focal Pertinent Labs Lab Results CBC    Component Value Date/Time   WBC 6.5 11/03/2022 0424   RBC 4.40 11/03/2022 0424   HGB 13.3 11/03/2022 0424   HGB 15.2 05/01/2022 1454   HCT 39.7 11/03/2022 0424   HCT 44.6 05/01/2022 1454   PLT 260 11/03/2022 0424   PLT 354 05/01/2022 1454   MCV 90.2 11/03/2022 0424   MCV 90 05/01/2022 1454   MCV 84 03/14/2012 1215   MCH 30.2 11/03/2022 0424   MCHC 33.5 11/03/2022 0424   RDW 12.7 11/03/2022 0424   RDW 12.4 05/01/2022 1454   RDW 13.6 03/14/2012 1215   LYMPHSABS 1.7 03/12/2020 1439   EOSABS 0.2 03/12/2020 1439   BASOSABS 0.1 03/12/2020 1439       Latest Ref Rng & Units 11/03/2022    4:24 AM 11/01/2022    2:39 PM 05/01/2022    2:54 PM  CMP  Glucose 70 - 99 mg/dL 161  096  81   BUN 6 - 20 mg/dL 9  16  17    Creatinine 0.44 - 1.00 mg/dL 0.45  4.09  8.11   Sodium 135 - 145 mmol/L 138  140  143   Potassium 3.5 - 5.1 mmol/L 3.0  3.1  4.8   Chloride 98 - 111 mmol/L 107  106  102   CO2 22 - 32 mmol/L 24  26  23    Calcium 8.9 - 10.3 mg/dL 9.0  9.3  91.4   Total Protein 6.5 - 8.1 g/dL  7.1  7.0   Total Bilirubin 0.3 - 1.2 mg/dL  1.1  0.4   Alkaline Phos 38 - 126 U/L  40  60   AST 15 - 41 U/L  33  16   ALT 0 - 44 U/L  30  12       Microbiology: No results found for this or any previous visit (from the past 240 hour(s)).  IMAGING RESULTS: I have personally reviewed the films ? Impression/Recommendation ? ? ? ___________________________________________________ Discussed with patient, requesting provider Note:  This document was prepared using Dragon voice recognition software and may include unintentional dictation errors.

## 2022-11-03 NOTE — Plan of Care (Signed)
Patient arrived at [facility] for LTM EEG. Seen by Dr. Amada Jupiter at [facility]. LTM ordered. Neurology will follow.  -- Milon Dikes, MD Neurologist [facility] Pager: [phone]

## 2022-11-03 NOTE — Progress Notes (Signed)
Report called to [place] [address]. Provided to [facility] CN. All questions addressed at time of call.

## 2022-11-04 ENCOUNTER — Inpatient Hospital Stay (HOSPITAL_COMMUNITY): Payer: BC Managed Care – PPO

## 2022-11-04 DIAGNOSIS — R4182 Altered mental status, unspecified: Secondary | ICD-10-CM

## 2022-11-04 DIAGNOSIS — G9340 Encephalopathy, unspecified: Secondary | ICD-10-CM | POA: Diagnosis not present

## 2022-11-04 DIAGNOSIS — R569 Unspecified convulsions: Secondary | ICD-10-CM

## 2022-11-04 LAB — BASIC METABOLIC PANEL
Anion gap: 8 (ref 5–15)
BUN: 8 mg/dL (ref 6–20)
CO2: 22 mmol/L (ref 22–32)
Calcium: 8.9 mg/dL (ref 8.9–10.3)
Chloride: 108 mmol/L (ref 98–111)
Creatinine, Ser: 0.6 mg/dL (ref 0.44–1.00)
GFR, Estimated: >60 mL/min (ref >60)
Glucose, Bld: 98 mg/dL (ref 70–99)
Potassium: 3.6 mmol/L (ref 3.5–5.1)
Sodium: 138 mmol/L (ref 135–145)

## 2022-11-04 LAB — C-REACTIVE PROTEIN: CRP: 0.5 mg/dL (ref <1.0)

## 2022-11-04 LAB — CBC
HCT: 40.7 % (ref 36.0–46.0)
Hemoglobin: 13.4 g/dL (ref 12.0–15.0)
MCH: 29.8 pg (ref 26.0–34.0)
MCHC: 32.9 g/dL (ref 30.0–36.0)
MCV: 90.6 fL (ref 80.0–100.0)
Platelets: 231 10*3/uL (ref 150–400)
RBC: 4.49 MIL/uL (ref 3.87–5.11)
RDW: 12.7 % (ref 11.5–15.5)
WBC: 5.8 10*3/uL (ref 4.0–10.5)
nRBC: 0 % (ref 0.0–0.2)

## 2022-11-04 LAB — MAGNESIUM: Magnesium: 2 mg/dL (ref 1.7–2.4)

## 2022-11-04 LAB — THYROGLOBULIN ANTIBODY: Thyroglobulin Antibody: <1 IU/mL (ref 0.0–0.9)

## 2022-11-04 LAB — CRYPTOCOCCAL ANTIGEN: Crypto Ag: NEGATIVE

## 2022-11-04 LAB — LYME DISEASE SEROLOGY W/REFLEX: Lyme Total Antibody EIA: NEGATIVE

## 2022-11-04 MED ORDER — ENSURE ENLIVE PO LIQD
237.0000 mL | Freq: Two times a day (BID) | ORAL | Status: DC
  Administered 2022-11-04 – 2022-11-07 (×5): 237 mL via ORAL

## 2022-11-04 MED ORDER — THIAMINE MONONITRATE 100 MG PO TABS
100.0000 mg | ORAL_TABLET | Freq: Every day | ORAL | Status: DC
  Administered 2022-11-06 – 2022-11-07 (×2): 100 mg via ORAL
  Filled 2022-11-04: qty 1

## 2022-11-04 MED ORDER — THIAMINE HCL 100 MG/ML IJ SOLN
500.0000 mg | Freq: Every day | INTRAVENOUS | Status: DC
  Administered 2022-11-04 – 2022-11-05 (×2): 500 mg via INTRAVENOUS
  Filled 2022-11-04 (×3): qty 5

## 2022-11-04 NOTE — Procedures (Signed)
Patient Name: Jody Nolan  MRN: [id number]  Epilepsy Attending: Charlsie Quest  Referring Physician/Provider: Milon Dikes, MD  Duration: 11/04/2022 0202 to 11/05/2022 0202  Patient history: 57 year old female with a history of altered mental status the past 3 weeks. EEG to evaluate for seizure   Level of alertness: Awake, asleep  AEDs during EEG study: LEV  Technical aspects: This EEG study was done with scalp electrodes positioned according to the 10-20 International system of electrode placement. Electrical activity was reviewed with band pass filter of 1-70Hz , sensitivity of 7 uV/mm, display speed of 86mm/sec with a 60Hz  notched filter applied as appropriate. EEG data were recorded continuously and digitally stored.  Video monitoring was available and reviewed as appropriate.  Description: No clear posterior dominant rhythm was seen. EEG showed continuous generalized 3 to 6 Hz theta-delta slowing. Generalized periodic discharges ( GPDS)with triphasic morphology were noted at 1-1.5 Hz, more prominent when awake/stimulated. At times the GPDs appeared more prominent in the left hemisphere. Hyperventilation and photic stimulation were not performed.     ABNORMALITY - Periodic discharges with triphasic morphology, generalized ( GPDs) - Continuous slow, generalized  IMPRESSION: This study showed generalized periodic discharges with triphasic morphology which can be on the ictal-interictal continuum. However, the morphology, frequency and reactivity to stimulation is more commonly suggestive of toxic-metabolic causes. Additionally there is moderate diffuse encephalopathy. No seizures were seen throughout the recording.  [name] Annabelle Harman

## 2022-11-04 NOTE — Progress Notes (Signed)
LTM maint complete - no skin breakdown [facility] monitored, Event button test confirmed by [facility]. ? ?

## 2022-11-04 NOTE — Consult Note (Incomplete)
Neurology Consultation    Reason for Consult:Altered mental status  CC: Altered mental status   HISTORY OF PRESENT ILLNESS   HPI   Jody Nolan is a 57 y.o. 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 On October 20, 2022 she was seen at a [facility] after finding a tick on her back. She was prescribed doxycycline but it is unclear if she took it and if she experienced any symptoms from this. Her boyfriend stated that over the last 3 weeks she has been more tired than normal, would take longer to answer questions, and was stating that she was tired. He noted progressively worsening confusion, asking abnormal questions, and thinking her mother was in town when she was note. She was found wandering around her apartment complex, disoriented and confused which is what prompted him to call EMS on July 2nd. She was seen at [facility] in [place] for anxiety. CT Head showed NAICA. Per provider notes, "The patient notes that her boyfriend called EMS as he was concerned for her. Her boyfriend at bedside states that she has been anxious, over the last day or so, she has been slower to respond, and not acting like herself. He was unsure if this was anxiety, or if there could be a metabolic. He notes that they have been undergoing a very stressful situation as he found out that she has a husband in another state. States that they were going to have a dinner tonight to discuss their relationship, patient notes that this is very stressful for her. No recent fevers, chills, cough, congestion, nausea, vomiting, diarrhea, abdominal pain, urinary symptoms. Boyfriend notes she does have a history of Valium use. She denies suicidal ideations, homicidal ideations, audiovisual hallucinations."  He  then described an episode where he was called by her neighbor as well as police were at her house because she was screaming "help me, someone is trying to murder me" from her balcony which was actually a message left on his voicemail. This is finally what prompted him to contact her children and finally her husband to try and get her help. Her downstairs neighbor took her out to dinner and stated that she was unable to chew something off the menu, was acting very odd.  Due to this, she called the patient's husband and mother and they went down and got her to bring her back to stay with her mother.  Since that time, her husband has been seeing her regularly, and feels that she may be slightly worse than when he first saw her.  Her speech cadence is decreasing, and she is not speaking much at all at this point.   Also of note, the patient has been aggressively losing weight over the past few months intentionally.  She has apparently not been eating well, has lost approximately 80 pounds over the past few years.      History is obtained from: Chart review    ROS: Review of systems not obtained due to patient factors.   PAST MEDICAL HISTORY    Past Medical History:  Past Medical History:  Diagnosis Date   Allergic rhinitis    Degeneration of cervical intervertebral disc    DUB (dysfunctional uterine bleeding)    Family history of adverse reaction to anesthesia  Mothr - PONV   Family history of colon cancer    History of abnormal cervical Pap smear    History of chicken pox    Radiculopathy    Reaction, stress, acute    Shoulder pain     No family history on file. Family History  Problem Relation Age of Onset   Breast cancer Mother 38   Colon cancer Father    Hypertension Brother    Stroke Paternal Grandmother    Hypertension Paternal Grandmother    Breast cancer Maternal Aunt 73    Allergies:  Allergies  Allergen Reactions   Adhesive [Tape] Rash    Bandaids cause rash     Social History:   reports that she has never smoked. She has never used smokeless tobacco. She reports current alcohol use of about 1.0 standard drink of alcohol per week. She reports that she does not use drugs.    Medications Medications Prior to Admission  Medication Sig Dispense Refill   ALPRAZolam (XANAX) 0.25 MG tablet Take 1 tablet (0.25 mg total) by mouth daily. 90 tablet 1   doxycycline (VIBRA-TABS) 100 MG tablet Take 1 tablet (100 mg total) by mouth every 12 (twelve) hours. 1 tablet 0   fluticasone (FLONASE) 50 MCG/ACT nasal spray Place 2 sprays into both nostrils in the morning and at bedtime. (Patient not taking: Reported on 11/02/2022) 18.2 mL 11   levETIRAcetam (KEPPRA) 1000 MG/100ML SOLN Inject 100 mLs (1,000 mg total) into the vein every 12 (twelve) hours. 2000 mL 0   montelukast (SINGULAIR) 10 MG tablet TAKE 1 TABLET BY MOUTH EVERYDAY AT BEDTIME 90 tablet 3   sodium chloride 0.9 % SOLN 50 mL with thiamine 100 MG/ML SOLN 500 mg Inject 500 mg into the vein 3 (three) times daily. 1 mL 0   valACYclovir (VALTREX) 1000 MG tablet TAKE 1 TABLET (1,000 MG TOTAL) BY MOUTH 2 (TWO) TIMES DAILY. TAKE 2 TABLETS TWICE DAILY FOR 5-7 DAYS FOR CURRENT FLARE (Patient taking differently: Take 1,000 mg by mouth 2 (two) times daily as needed (FLARE).) 20 tablet 1    EXAMINATION    Current vital signs:    11/04/2022    8:00 AM 11/04/2022    3:30 AM 11/03/2022   11:13 PM  Vitals with BMI  Systolic 116 100 161  Diastolic 84 75 82  Pulse 82 81 74    Examination:  GENERAL: Awake, alert in NAD HEENT: - Normocephalic and atraumatic, dry mm, no lymphadenopathy, no Thyromegally LUNGS - Regular, unlabored respirations CV - S1S2 RRR, equal pulses bilaterally. ABDOMEN - Soft, nontender, nondistended with normoactive BS Ext: warm, well perfused, intact peripheral pulses, no pedal edema Integumentary:  Skin intact on clothed exam  NEURO:  Mental Status: Drowsy needs frequent stimulation to stay  awake during exam, no speech output during my exam. Slow to follow commands. Does not consistently follow two step commands.  She does lift her arms to drink water and does switch hands when asked, but she needs a lot encouragement Cranial Nerves:  II: PERRL. Visual fields full to confrontation.  III, IV, VI: EOM intact. Eyelids elevate symmetrically. Blinks to threat.  V: Sensation intact V1-3 symmetrically  VII: no facial asymmetry   VIII: hearing intact to voice IX, X: Palate elevates symmetrically.  WR:[id number] shrug 5/5 and symmetrical  XII: tongue is midline without fasciculations. Motor:  RUE: 4/5 LUE: 4/5 RLE 4/5        LLE: 4/5  Tone: is normal and bulk is normal  Sensation- Intact to light touch bilaterally Coordination: no ataxia on exam, accurate with drinking out of a straw Gait- deferred    LABS   I have reviewed labs in epic and the results pertinent to this consultation are:  Lab Results  Component Value Date   LDLCALC 130 (H) 05/01/2022   Lab Results  Component Value Date   ALT 30 11/01/2022   AST 33 11/01/2022   ALKPHOS 40 11/01/2022   BILITOT 1.1 11/01/2022   Lab Results  Component Value Date   HGBA1C 5.7 (H) 05/01/2022   Lab Results  Component Value Date   WBC 5.8 11/04/2022   HGB 13.4 11/04/2022   HCT 40.7 11/04/2022   MCV 90.6 11/04/2022   PLT 231 11/04/2022   Lab Results  Component Value Date   VITAMINB12 534 11/02/2022   Lab Results  Component Value Date   FOLATE 12.3 05/01/2022   Lab Results  Component Value Date   NA 138 11/04/2022   K 3.6 11/04/2022   CL 108 11/04/2022   CO2 22 11/04/2022   Lab Results  Component Value Date   AMMONIA <10 11/02/2022    Cryptococcal antigen- negative CRP - 0.5 Sed rate- 10 Meningitis/encephalitis panel- negative VDRL IgG Oligoclonal bands [place] IgG [place]ile IgM Ehrlichia Arbobirus IgG and IgM Autoimmune encephalitis Ab Protein/Glucose- 41/70 CSF WBC- 17 TSH- 2.862 T4,  free- 1.04 Thyroglobulin antibody Thyroid peroxidase B1 ANA w/ reflex ANCA Heavy metals Lime disease serology   DIAGNOSTIC IMAGING/PROCEDURES   I have reviewed the images obtained:, as below    CTA head and neck  1. Patent vasculature of the head and neck without hemodynamically significant or occlusion. No evidence of vasospasm or RCVS in the intracranial vasculature. 2. 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  MRI brain Widespread, bilateral abnormal cortical diffusion and FLAIR signal in a nonspecific pattern. Suspect asymmetric caudate nucleus involvement also on the left. But white matter seems spared, there is no associated abnormal enhancement, and no intracranial mass effect.  Broad differential diagnosis at this point and recommend Neurology consultation with consideration of: Infectious Meningoencephalitis (although lack of enhancement argues against), Reversible cerebral vasoconstriction (RCVS), Urea cycle disorders/Uremia, hypoglycemia, Seizure disorder, PRES (although sparing of white matter argues against), Hepatic encephalopathy, Mitochondrial disorder, and Creutzfeldt-jakob disease.  LTM EEG 11/04/2022 0202 to 11/04/2022 0915  ABNORMALITY - Periodic discharges with triphasic morphology, generalized ( GPDs) - Continuous slow, generalized   IMPRESSION: This study showed generalized periodic discharges with triphasic morphology which can be on the ictal-interictal continuum. However, the morphology, frequency and reactivity to stimulation is more commonly suggestive of toxic-metabolic causes. Additionally there is moderate diffuse encephalopathy. No seizures were seen throughout the recording.  ASSESSMENT/PLAN    Assessment:  57 year old female with relatively abrupt onset severe  confusion with cortical ribboning on MRI. Symptoms of confusion and altered mental status are thought to have been going on for about 3 weeks. Possibilities include encephalitis, autoimmune or infectious, though infectious is slightly less likely given no fever.   Recommendations: - Continue LTM - continue high-dose thiamine - continue Keppra 1 g twice daily after 2 g load  --    **This documentation was dictated using Dragon Medical Software and may contain inadvertent errors **

## 2022-11-04 NOTE — Plan of Care (Signed)

## 2022-11-04 NOTE — Hospital Course (Addendum)
Brief hospital course: The patient is a 57yo with PMH of depression, anxiety, DUB presented on 7/10 to [facility] with complaints of confusion.  Neurology and ID were consulted. She Underwent further workup including LP, MRI brain, and EEG.  MRI brain showed lateral abnormal cortical diffusion and FLAIR signal and EEG showed triphasic waves as well as severe encephalopathy.  Patient was started on IV Keppra and was sent to [facility] for long-term EEG monitoring.  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    Interim History: -She has not responded to steroids, completed 7 days.   -She briefly had Cortrak for nutrition but pulled this out, was very agitated by it.  Family is not currently considering replacement. -Diagnosis becomes more apparent and [facility] RT quick and 14 3 3  Protein all Positive.   -PICC was placed on 7/20.   -Palliative care is consulting. 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  Family is deciding about whether to transition to [facility] and husband touring [employer] today.   She is stable to Transfer to [employer] today and will continue End [facility] Life care at [facility]   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 -Repeat MRI on 7/17 with extensive cortical hyperintensity with associated restricted diffusion, most c/w CJD -Appreciate neurology follow-up. -Started on IV doxycycline but now off.  ID followed but has signed off. 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 -Significant life stress PTA is unlikely to be the cause.  Unintentional Weight Loss -Reported history.   -Unclear about exact extent of weight loss. -Could be related to mood disorder. Monitor for now. Receiving thiamine therapy for possible encephalopathy related to that Nutrition Problem: Severe Malnutrition Etiology: acute illness (acute encephalopathy) Signs/Symptoms: energy intake < or equal to 50% for > or equal to 5 days, mild fat depletion, moderate muscle depletion, percent weight loss (11% weight loss in 6 months) Percent weight loss: 11 % Interventions: Tube feeding, MVI -Pulled out Cortrak within hours of placement, clearly agitated by it -She has been able to tolerate floor stock liquids and puree/pudding solids with slow, small sips/bites b   History of Tick Bite -Reported history of tick bite. -Concern for CNS encephalitis in the setting of tickborne illness - appears to have been ruled out -ID followed initially and now signed off -No longer on Doxy.  All medications not in the patient's comfort have been discontinued as patient has been made comfort care.  Will be discharging to residential hospice now that bed is available.

## 2022-11-04 NOTE — Progress Notes (Signed)
[facility] Progress Note Patient: Jody Nolan ZOX:[id number] DOB: 1966/03/26 DOA: 11/03/2022  DOS: the patient was seen and examined on 11/04/2022  Brief hospital course: PMH of depression, anxiety, DU B presented to the hospital with complaints of confusion. Admitted at [facility] 7/11.  Neurology was consulted as well as ID was consulted. Underwent further workup including an LP as well as MRI brain and EEG.  MRI brain showed by lateral abnormal cortical diffusion and FLAIR signal and EEG showed triphasic waves as well as severe encephalopathy.  Patient was started on IV Keppra and was sent to [facility] for long-term EEG monitoring. Assessment and Plan: Acute encephalopathy. Etiology still not clear. Concern for seizures. Appreciate neurology follow-up. Workup including RPR, HIV, TSH, B12 unremarkable. B1 pending. Thyroid antibodies unremarkable. ANA pending. Autoimmune encephalitis panel pending heavy metal screen pending.  Lyme titers pending. Started on IV doxycycline for now. Started on IV Keppra. Continuing LTM EEG per neurology. Further workup and therapy per neurology. Patient on 7/13 able to follow simple commands, still lethargic muted/severely muffled voice.  Hypokalemia. Replaced.  History of anxiety and depression. Not on any medication. Currently undergoing significant stress in her life unsure if that is contributing to her acute worsening.  Unintentional weight loss. Could be related to mood disorder. Monitor for now. Receiving thiamine therapy for possible encephalopathy related to that.  History of tick bite. Reported history of tick bite. Concern for CNS encephalitis in the setting of tickborne illness. Currently on IV doxycycline. My suspicion for that is less. ID has seen the patient at [facility].  Once the infection workup returns, will discuss with ID at [facility] again for consultation.    Subjective: No nausea no vomiting no fever  no chills.  No events overnight.  Received Ativan prior to transfer.  Physical Exam: General: in Mild distress, No Rash Cardiovascular: S1 and S2 Present, No Murmur Respiratory: Good respiratory effort, Bilateral Air entry present. No Crackles, No wheezes Abdomen: Bowel Sound present, difficult to assess tenderness Extremities: No edema Neuro: Drowsy but easily awakened and able to say in very low muted voice but unable to tell her name therefore unable to answer orientation questions x3, no new focal deficit, bilateral weakness.  Withdraws to painful stimuli bilaterally.  Data Reviewed: I have Reviewed nursing notes, Vitals, and Lab results. Since last encounter, pertinent lab results CBC and BMP   . I have ordered test including CBC and BMP  . I have discussed pt's care plan and test results with neurology  .   Disposition: Status is: Inpatient Remains inpatient appropriate because: Need further workup for encephalopathy and treatment  enoxaparin (LOVENOX) injection 40 mg Start: 11/03/22 2115   Family Communication: Family at bedside Level of care: Telemetry Medical   Vitals:   11/03/22 2100 11/03/22 2313 11/04/22 0330 11/04/22 0800  BP: 125/69 126/82 100/75 116/84  Pulse: 80 74 81 82  Resp: 18 17 17 15   Temp:  98.8 F (37.1 C) 97.9 F (36.6 C) 98.9 F (37.2 C)  TempSrc:  Axillary Axillary Oral  SpO2: 96% 98% 96% 95%     Author: Lynden Oxford, MD 11/04/2022 11:10 AM  Please look on [email or web address] to find out who is on call.

## 2022-11-04 NOTE — Progress Notes (Signed)
LTM EEG hooked up and running - no initial skin breakdown - push button tested - [facility] monitoring.  

## 2022-11-04 NOTE — Consult Note (Incomplete)
Neurology Consultation    Reason for Consult: Altered mental status  CC: Altered mental status   HISTORY OF PRESENT ILLNESS   HPI   Jody Nolan is a 57 y.o. 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 On October 20, 2022 she was seen at a [facility] after finding a tick on her back. She was prescribed doxycycline but it is unclear if she took it and if she experienced any symptoms from this. Her boyfriend stated that over the last 3 weeks she has been more tired than normal, would take longer to answer questions, and was stating that she was tired. He noted progressively worsening confusion, asking abnormal questions, and thinking her mother was in town when she was not. She was found wandering around her apartment complex, disoriented and confused which is what prompted him to call EMS on July 2nd. She was seen at [facility] in [place] for anxiety. CT Head showed NAICA. Per provider notes, "The patient notes that her boyfriend called EMS as he was concerned for her. Her boyfriend at bedside states that she has been anxious, over the last day or so, she has been slower to respond, and not acting like herself. He was unsure if this was anxiety, or if there could be a metabolic. He notes that they have been undergoing a very stressful situation as he found out that she has a husband in another state. States that they were going to have a dinner tonight to discuss their relationship, patient notes that this is very stressful for her. No recent fevers, chills, cough, congestion, nausea, vomiting, diarrhea, abdominal pain, urinary symptoms. Boyfriend notes she does have a history of Valium use. She denies suicidal ideations, homicidal ideations, audiovisual hallucinations."  He  then described an episode where he was called by her neighbor as well as police were at her house because she was screaming "help me, someone is trying to murder me" from her balcony which was actually a message left on his voicemail. This is finally what prompted him to contact her children and finally her husband to try and get her help. Her downstairs neighbor took her out to dinner and stated that she was unable to choose something off the menu, was acting very odd.  Due to this, she called the patient's husband and mother and they went down and got her to bring her back to stay with her mother.  Since that time, her husband has been seeing her regularly, and feels that she may be slightly worse than when he first saw her.  Her speech cadence is decreasing, and she is not speaking much at all at this point.   Also of note, the patient has been aggressively losing weight over the past few months intentionally.  She has apparently not been eating well, has lost approximately 80 pounds over the past few years. However, in her current encephalopathic state, when spoon fed by her husband in the hospital, she has shown evidence of a very strong appetite.    History is obtained from: Chart review, husband and daughter   ROS: Review of systems not obtained due to patient factors.   PAST MEDICAL HISTORY    Past Medical History:  Past Medical History:  Diagnosis Date  . Allergic rhinitis   .  Degeneration of cervical intervertebral disc   . DUB (dysfunctional uterine bleeding)   . Family history of adverse reaction to anesthesia    Mothr - PONV  . Family history of colon cancer   . History of abnormal cervical Pap smear   . History of chicken pox   . Radiculopathy   . Reaction, stress, acute   . Shoulder pain     No family history on file. Family History  Problem Relation Age of Onset  . Breast cancer Mother 55  . Colon cancer Father   . Hypertension Brother   . Stroke Paternal Grandmother    . Hypertension Paternal Grandmother   . Breast cancer Maternal Aunt 42    Allergies:  Allergies  Allergen Reactions  . Adhesive [Tape] Rash    Bandaids cause rash    Social History:   reports that she has never smoked. She has never used smokeless tobacco. She reports current alcohol use of about 1.0 standard drink of alcohol per week. She reports that she does not use drugs.    Medications Medications Prior to Admission  Medication Sig Dispense Refill  . ALPRAZolam (XANAX) 0.25 MG tablet Take 1 tablet (0.25 mg total) by mouth daily. 90 tablet 1  . doxycycline (VIBRA-TABS) 100 MG tablet Take 1 tablet (100 mg total) by mouth every 12 (twelve) hours. 1 tablet 0  . fluticasone (FLONASE) 50 MCG/ACT nasal spray Place 2 sprays into both nostrils in the morning and at bedtime. (Patient not taking: Reported on 11/02/2022) 18.2 mL 11  . levETIRAcetam (KEPPRA) 1000 MG/100ML SOLN Inject 100 mLs (1,000 mg total) into the vein every 12 (twelve) hours. 2000 mL 0  . montelukast (SINGULAIR) 10 MG tablet TAKE 1 TABLET BY MOUTH EVERYDAY AT BEDTIME 90 tablet 3  . sodium chloride 0.9 % SOLN 50 mL with thiamine 100 MG/ML SOLN 500 mg Inject 500 mg into the vein 3 (three) times daily. 1 mL 0  . valACYclovir (VALTREX) 1000 MG tablet TAKE 1 TABLET (1,000 MG TOTAL) BY MOUTH 2 (TWO) TIMES DAILY. TAKE 2 TABLETS TWICE DAILY FOR 5-7 DAYS FOR CURRENT FLARE (Patient taking differently: Take 1,000 mg by mouth 2 (two) times daily as needed (FLARE).) 20 tablet 1    EXAMINATION    Current vital signs:    11/04/2022    8:00 AM 11/04/2022    3:30 AM 11/03/2022   11:13 PM  Vitals with BMI  Systolic 116 100 161  Diastolic 84 75 82  Pulse 82 81 74    Examination:  GENERAL: Awake, alert in NAD HEENT: - Normocephalic and atraumatic, dry mm, no lymphadenopathy, no Thyromegally LUNGS - Regular, unlabored respirations CV - S1S2 RRR, equal pulses bilaterally. ABDOMEN - Soft, nontender, nondistended with normoactive  BS Ext: warm, well perfused, intact peripheral pulses, no pedal edema Integumentary:  Skin intact on clothed exam  NEURO:  Mental Status: Drowsy and needs frequent stimulation to stay awake during exam. No speech output during initial exam by NP. Slow to follow commands. Does not consistently follow two step commands.  She does lift her arms to drink water and does switch hands when asked, but she needs a lot of encouragement. She murmurs one-word answers to questions on subsequent exam by attending, but is no longer drowsy. She is unable to answer any orientation questions and verbally perseverates with previously given one-word answers.  Cranial Nerves:  II: PERRL. Visual fields full to confrontation.  III, IV, VI: EOM intact. Eyelids elevate symmetrically. Blinks to  threat.  V: Sensation intact V1-3 symmetrically  VII: no facial asymmetry   VIII: hearing intact to voice IX, X: Palate elevates symmetrically.  ZO:[id number] shrug 5/5 and symmetrical  XII: tongue is midline without fasciculations. Motor:  RUE: 4/5 LUE: 4/5 RLE 4/5        LLE: 4/5  Tone is normal and bulk is normal No myoclonus or tremor noted.  Sensation- Intact to light touch bilaterally Coordination: No ataxia on exam, accurate with drinking out of a straw Gait- Deferred    LABS   I have reviewed labs in epic and the results pertinent to this consultation are:  Lab Results  Component Value Date   LDLCALC 130 (H) 05/01/2022   Lab Results  Component Value Date   ALT 30 11/01/2022   AST 33 11/01/2022   ALKPHOS 40 11/01/2022   BILITOT 1.1 11/01/2022   Lab Results  Component Value Date   HGBA1C 5.7 (H) 05/01/2022   Lab Results  Component Value Date   WBC 5.8 11/04/2022   HGB 13.4 11/04/2022   HCT 40.7 11/04/2022   MCV 90.6 11/04/2022   PLT 231 11/04/2022   Lab Results  Component Value Date   VITAMINB12 534 11/02/2022   Lab Results  Component Value Date   FOLATE 12.3 05/01/2022   Lab Results   Component Value Date   NA 138 11/04/2022   K 3.6 11/04/2022   CL 108 11/04/2022   CO2 22 11/04/2022   Lab Results  Component Value Date   AMMONIA <10 11/02/2022   Electrolytes normal CBC normal RPR negative UDS negative EtOH and salicylate: Negative Glucose 98 CRP - 0.5 Sed rate- 10 CSF labs resulted:  - Clear and colorless - Meningitis/encephalitis panel- negative - Protein 41 - Glucose 70 - CSF WBC- mildly elevated at 17 - Cryptococcal antigen- negative CSF labs pending:  VDRL  IgG index Oligoclonal bands [place] IgG [place] IgM Ehrlichia Arbovirus IgG and IgM Autoimmune encephalitis Ab TSH- 2.862 T4, free- 1.04 Thyroglobulin antibody negative Thyroid peroxidase pending Vitamin B1 pending Vitamin B12 normal ANA w/ reflex pending ANCA pending Heavy metals pending Lime disease serology negative   DIAGNOSTIC IMAGING/PROCEDURES   I have reviewed the images obtained:, as below    CTA head and neck  1. Patent vasculature of the head and neck without hemodynamically significant or occlusion. No evidence of vasospasm or RCVS in the intracranial vasculature. 2. 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  3. 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  MRI brain Widespread, bilateral abnormal cortical diffusion with ribboning and correlating FLAIR signal in a nonspecific pattern. There is also asymmetric caudate nucleus involvement also on the left. But white matter seems spared, there is no associated abnormal enhancement, and no intracranial mass effect.  Broad differential diagnosis at this point and recommend Neurology consultation with consideration of: Infectious Meningoencephalitis (although lack of enhancement argues against), Reversible cerebral vasoconstriction (RCVS), Urea cycle disorders/Uremia,  hypoglycemia, Seizure disorder, PRES (although sparing of white matter argues against), Hepatic encephalopathy, Mitochondrial disorder, and Creutzfeldt-jakob disease.  LTM EEG 11/04/2022 0202 to 11/04/2022 0915  ABNORMALITY - Periodic discharges with triphasic morphology, generalized ( GPDs) - Continuous slow, generalized   IMPRESSION: This study showed generalized periodic discharges with triphasic morphology which can be on the ictal-interictal continuum. However, the morphology, frequency and reactivity to stimulation is more commonly suggestive of toxic-metabolic causes.  Additionally there is moderate diffuse encephalopathy. No seizures were seen throughout the recording.  ASSESSMENT/PLAN    Assessment:  57 year old female with relatively abrupt onset severe confusion with cortical ribboning on MRI. Symptoms of confusion and altered mental status are thought to have been going on for about 3 weeks. Possibilities include encephalitis, autoimmune or infectious, though infectious is slightly less likely given no fever.   Recommendations: - Continue LTM - continue high-dose thiamine - continue Keppra 1 g twice daily after 2 g load  --    **This documentation was dictated using Dragon Medical Software and may contain inadvertent errors **

## 2022-11-05 ENCOUNTER — Inpatient Hospital Stay (HOSPITAL_COMMUNITY): Payer: BC Managed Care – PPO

## 2022-11-05 DIAGNOSIS — G9340 Encephalopathy, unspecified: Secondary | ICD-10-CM

## 2022-11-05 LAB — CBC
HCT: 40.9 % (ref 36.0–46.0)
Hemoglobin: 13.5 g/dL (ref 12.0–15.0)
MCH: 29.7 pg (ref 26.0–34.0)
MCHC: 33 g/dL (ref 30.0–36.0)
MCV: 90.1 fL (ref 80.0–100.0)
Platelets: 264 10*3/uL (ref 150–400)
RBC: 4.54 MIL/uL (ref 3.87–5.11)
RDW: 12.8 % (ref 11.5–15.5)
WBC: 6.3 10*3/uL (ref 4.0–10.5)
nRBC: 0 % (ref 0.0–0.2)

## 2022-11-05 LAB — THYROID PEROXIDASE ANTIBODY: Thyroperoxidase Ab SerPl-aCnc: 26 IU/mL (ref 0–34)

## 2022-11-05 LAB — BASIC METABOLIC PANEL
Anion gap: 8 (ref 5–15)
BUN: 8 mg/dL (ref 6–20)
CO2: 25 mmol/L (ref 22–32)
Calcium: 9.3 mg/dL (ref 8.9–10.3)
Chloride: 107 mmol/L (ref 98–111)
Creatinine, Ser: 0.69 mg/dL (ref 0.44–1.00)
GFR, Estimated: >60 mL/min (ref >60)
Glucose, Bld: 95 mg/dL (ref 70–99)
Potassium: 3.5 mmol/L (ref 3.5–5.1)
Sodium: 140 mmol/L (ref 135–145)

## 2022-11-05 MED ORDER — LEVETIRACETAM 500 MG PO TABS
1000.0000 mg | ORAL_TABLET | Freq: Two times a day (BID) | ORAL | Status: DC
  Administered 2022-11-05 – 2022-11-07 (×5): 1000 mg via ORAL
  Filled 2022-11-05 (×6): qty 2

## 2022-11-05 MED ORDER — IOHEXOL 9 MG/ML PO SOLN
500.0000 mL | ORAL | Status: AC
  Administered 2022-11-05: 500 mL via ORAL

## 2022-11-05 MED ORDER — IOHEXOL 350 MG/ML SOLN
75.0000 mL | Freq: Once | INTRAVENOUS | Status: AC | PRN
  Administered 2022-11-05: 75 mL via INTRAVENOUS

## 2022-11-05 NOTE — Evaluation (Signed)
Occupational Therapy Evaluation Patient Details Name: Jody Nolan MRN: [id number] DOB: 06-06-1965 Today's Date: 11/05/2022   History of Present Illness Pt is a 57 y/o female presenting on [facility] on 7/10 with worsening mental status. MRI brain showed lateral abnormal cortical diffusion and FLAIR signal with severe encephalopathy. EEG study revealed periodic discharges with triphasic morphology which now appears more predominantly in left hemisphere. This pattern is on the ictal-interictal continuum specifically the lateralized nature of the discharges which can suggest underlying cortical abnormality. No signifcant PMH.   Clinical Impression   Patient admitted for above and presents with problem list below.  She is able to state her name but otherwise very limited verbalizing during session; she does follow 1 step commands well given increased time. She requires step by step cueing for sequencing basic ADL tasks, overall requiring min-max assist for ADLs; but able to complete transfers and mobility in room with min guard.  HR up to 143 during session.  Based on performance today, recommend continued OT services acutely and after dc at outpatient OT services.  At this time recommend 24/7 hands on care.  Will follow.      Recommendations for follow up therapy are one component of a multi-disciplinary discharge planning process, led by the attending physician.  Recommendations may be updated based on patient status, additional functional criteria and insurance authorization.   Assistance Recommended at Discharge Frequent or constant Supervision/Assistance  Patient can return home with the following A lot of help with bathing/dressing/bathroom;A little help with walking and/or transfers;Assistance with cooking/housework;Assistance with feeding;Direct supervision/assist for medications management;Direct supervision/assist for financial management;Assist for transportation;Help with stairs or ramp for  entrance    Functional Status Assessment  Patient has had a recent decline in their functional status and demonstrates the ability to make significant improvements in function in a reasonable and predictable amount of time.  Equipment Recommendations  BSC/3in1    Recommendations for Other Services       Precautions / Restrictions Precautions Precautions: Fall;Other (comment) Precaution Comments: watch HR, LTM EEG Restrictions Weight Bearing Restrictions: No      Mobility Bed Mobility Overal bed mobility: Needs Assistance Bed Mobility: Supine to Sit     Supine to sit: Supervision     General bed mobility comments: Initiates and completes EOB dangle with frequent verbal cueing. Slow moving.    Transfers Overall transfer level: Needs assistance Equipment used: None Transfers: Sit to/from Stand Sit to Stand: Min guard           General transfer comment: Adequate power to stand. Slow rise.      Balance Overall balance assessment: Needs assistance Sitting-balance support: Feet supported, No upper extremity supported Sitting balance-Leahy Scale: Good     Standing balance support: No upper extremity supported, During functional activity Standing balance-Leahy Scale: Fair Standing balance comment: Able to transfer and amb without UE support, although reaches for support with changes in direction or commands.                           ADL either performed or assessed with clinical judgement   ADL Overall ADL's : Needs assistance/impaired     Grooming: Maximal assistance;Standing;Wash/dry hands Grooming Details (indicate cue type and reason): pt washing hands at sink without turning water on initally, requires step by step cueing to seqeunce and complete task.         Upper Body Dressing : Maximal assistance;Sitting   Lower Body Dressing: Maximal assistance;Sit  to/from Database administrator: Min guard;Ambulation Toilet Transfer Details  (indicate cue type and reason): hand held assist, +2 safety Toileting- Clothing Manipulation and Hygiene: Sit to/from stand;Minimal assistance Toileting - Clothing Manipulation Details (indicate cue type and reason): pt requires initation to manage clothing, but able to engage after     Functional mobility during ADLs: Minimal assistance;Cueing for safety;Cueing for sequencing;Min guard       Vision   Additional Comments: appears WFL, unable to formally assess due to cognition     Perception     Praxis      Pertinent Vitals/Pain Pain Assessment Pain Assessment: Faces Faces Pain Scale: No hurt Pain Intervention(s): Monitored during session     Hand Dominance Right   Extremity/Trunk Assessment Upper Extremity Assessment Upper Extremity Assessment: Overall WFL for tasks assessed (intermittent rigidity with movements, unpredictable but affecting coordination)   Lower Extremity Assessment Lower Extremity Assessment: Defer to PT evaluation RLE Deficits / Details: At least 4/5 for all LE MMT. LLE Deficits / Details: Same as RLE   Cervical / Trunk Assessment Cervical / Trunk Assessment: Normal   Communication Communication Communication: Other (comment) (pt non verbal at this times, intermittent mouthing of words, very soft spoken)   Cognition Arousal/Alertness: Awake/alert Behavior During Therapy: Flat affect Overall Cognitive Status: Difficult to assess Area of Impairment: Following commands, Problem solving, Awareness                       Following Commands: Follows one step commands with increased time   Awareness: Intellectual Problem Solving: Slow processing, Decreased initiation, Requires verbal cues, Requires tactile cues, Difficulty sequencing General Comments: She was able to state her name but otherwise very few verbalizations made.  She is able to follow most simple commands with increased time but benefits from repetition and guiadance for initiation  and sequencing. She is unaware of soiled clothing and is unaware of need to use the restroom (but voided significant amount when assisted to bathroom)     General Comments  VSS on RA, HR max 143  during activity.    Exercises     Shoulder Instructions      Home Living Family/patient expects to be discharged to:: Private residence Living Arrangements: Parent (mother) Available Help at Discharge: Family;Available 24 hours/day Type of Home: House Home Access: Level entry     Home Layout: One level     Bathroom Shower/Tub: Chief Strategy Officer: Standard         Additional Comments: Family present in room reports pt will return to mother's home (home living setup inputted) upon d/c.      Prior Functioning/Environment Prior Level of Function : Independent/Modified Independent             Mobility Comments: No AD at baseline ADLs Comments: ind        OT Problem List: Decreased activity tolerance;Impaired balance (sitting and/or standing);Decreased coordination;Decreased cognition;Decreased safety awareness;Decreased knowledge of use of DME or AE;Decreased knowledge of precautions;Cardiopulmonary status limiting activity      OT Treatment/Interventions: Self-care/ADL training;Energy conservation;DME and/or AE instruction;Therapeutic activities;Cognitive remediation/compensation;Patient/family education;Balance training    OT Goals(Current goals can be found in the care plan section) Acute Rehab OT Goals Patient Stated Goal: unable OT Goal Formulation: Patient unable to participate in goal setting Time For Goal Achievement: 11/19/22 Potential to Achieve Goals: Fair  OT Frequency: Min 2X/week    Co-evaluation PT/OT/SLP Co-Evaluation/Treatment: Yes Reason for Co-Treatment: Complexity of the patient's impairments (  multi-system involvement) PT goals addressed during session: Mobility/safety with mobility OT goals addressed during session: ADL's and  self-care      AM-PAC OT "6 Clicks" Daily Activity     Outcome Measure Help from another person eating meals?: A Lot Help from another person taking care of personal grooming?: A Lot Help from another person toileting, which includes using toliet, bedpan, or urinal?: A Little Help from another person bathing (including washing, rinsing, drying)?: A Lot Help from another person to put on and taking off regular upper body clothing?: A Lot Help from another person to put on and taking off regular lower body clothing?: A Lot 6 Click Score: 13   End of Session Nurse Communication: Mobility status  Activity Tolerance: Patient tolerated treatment well Patient left: with call bell/phone within reach;in bed;with family/visitor present;with bed alarm set  OT Visit Diagnosis: Other abnormalities of gait and mobility (R26.89);Muscle weakness (generalized) (M62.81);Other symptoms and signs involving cognitive function                Time: 1610-9604 OT Time Calculation (min): 35 min Charges:  OT General Charges $OT Visit: 1 Visit OT Evaluation $OT Eval Moderate Complexity: 1 Mod  Barry Brunner, OT Acute Rehabilitation Services Office [phone]   Chancy Milroy 11/05/2022, 2:07 PM

## 2022-11-05 NOTE — Progress Notes (Signed)
Neurology Progress Note  Brief HPI: 57 year old female with relatively abrupt onset 3 weeks ago of progressive and precipitous cognitive decline, with first symptom being short term memory impairment, forgetting key details of her daughter's stated plans for a trip multiple times, manifested as forgetting over time frames of a few hours despite repeatedly being informed/corrected regarding the information she was not retaining. A few days later, her behavior started to become altered and somewhat inappropriate. She had complained of a recent tick bite without an associate rash and was prescribed with Doxycycline for a presumed tick-borne illness at [facility] at some point. Symptoms progressed to severe confusion requiring admission to the hospital in [facility], near her residence there. She was determined to be safe for discharge after some testing that did not yield a diagnosis. Family then drove her up to [place] to be watched closely and cared for by them, as she seemed to be precipitously losing her ability to manage her own affairs. After further decline, they brought her to [facility] to be evaluated. Work up there included LP as well as MRI brain, which revealed multifocal cortical ribboning on DWI and T2, as well as abnormal basal ganglia signal changes, but no abnormal enhancement.   Again, symptoms of progressively worsening confusion and altered mental status are thought to have been going on for about 3 weeks. Possibilities include autoimmune or infectious encephalitis, though a bacterial or one of the more common viral infectious etiologies are less likely given no fever and negative meningitis/encephalitis CSF PCR panel. Exam reveals a severely abulic patient without myoclonus or tremor. Tone is normal, with no waxy rigidity noted. CSF labs negative for infection so far. There is mild nonspecific elevation of CSF WBCs. CSF Protein and glucose are normal. EEG with triphasic waves.    Subjective: Patient seen in room with family at the bedside. She is hugging and laughing with family members, sparse speech output. Nodding appropriately. Seems to be having some expressive aphasia.   Beta Isoform, RT-QuIC and tau protein have been ordered as a send-out panel to the [facility] at [facility] from the remaining CSF at [facility] to assess for possible CJD. Spoke with lab at [facility] and it is being sent. Pathology and Infection Prevention notified as well.  Exam: Vitals:   11/05/22 0658 11/05/22 0700  BP: 116/79 122/82  Pulse: 87 87  Resp: 16 13  Temp: 99.1 F (37.3 C)   SpO2: 100% 99%   Gen: In bed, NAD Resp: non-labored breathing, no acute distress Abd: soft, nt  Neuro: Mental Status: Awake and more interactive today  Able to identify niece but will not state her name, age, or birthday.  Cranial Nerves:  II: PERRL. Visual fields full to confrontation.  III, IV, VI: EOM intact. Eyelids elevate symmetrically. Blinks to threat.  V: Sensation intact V1-3 symmetrically  VII: no facial asymmetry   VIII: hearing intact to voice IX, X: Palate elevates symmetrically.  ZO:[id number] shrug 5/5 and symmetrical  XII: tongue is midline without fasciculations. Motor:  RUE: 4/5         LUE: 4/5 RLE 4/5           LLE: 4/5  Tone is normal and bulk is normal No myoclonus or tremor noted.  Sensation- Intact to light touch bilaterally Coordination: No ataxia on exam, accurate with drinking out of a straw Gait- Deferred     Pertinent Labs: Electrolytes normal CBC normal RPR negative UDS negative EtOH and  salicylate: Negative Glucose 98 CRP - 0.5 Sed rate- 10 CSF labs resulted:             - Clear and colorless - Meningitis/encephalitis panel- negative - Protein 41 - Glucose 70 - CSF WBC- mildly elevated at 17 - Cryptococcal antigen- negative CSF labs pending:  VDRL  IgG index Oligoclonal bands [place] IgG [place]  IgM Ehrlichia Arbovirus IgG and IgM Autoimmune encephalitis Ab Paraneoplastic Ab  TSH- 2.862 T4, free- 1.04 Thyroglobulin antibody negative Thyroid peroxidase 26 Vitamin B1 pending Vitamin B12 normal ANA w/ reflex pending ANCA pending Heavy metals pending Lime disease serology negative HIV negative   Imaging Reviewed: CTA head and neck  1. Patent vasculature of the head and neck without hemodynamically significant or occlusion. No evidence of vasospasm or RCVS in the intracranial vasculature. 2. 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  3. 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   MRI brain Widespread, bilateral abnormal cortical diffusion with ribboning and correlating FLAIR signal in a nonspecific pattern. There is also asymmetric caudate nucleus involvement also on the left. But white matter seems spared, there is no associated abnormal enhancement, and no intracranial mass effect. Broad differential diagnosis at this point and recommend Neurology consultation with consideration of: Infectious Meningoencephalitis (although lack of enhancement argues against), Reversible cerebral vasoconstriction (RCVS), Urea cycle disorders/Uremia, hypoglycemia, Seizure disorder, PRES (although sparing of white matter argues against), Hepatic encephalopathy, Mitochondrial disorder, and Creutzfeldt-jakob disease.   LTM EEG 11/04/2022 0202 to 11/04/2022 0915  ABNORMALITY - Periodic discharges with triphasic morphology, generalized ( GPDs) - Continuous slow, generalized  IMPRESSION: This study showed generalized periodic discharges with triphasic morphology which can be on the ictal-interictal continuum. However, the morphology, frequency and reactivity to stimulation is more commonly suggestive of toxic-metabolic causes. Additionally there is moderate  diffuse encephalopathy. No seizures were seen throughout the recording.  LTM EEG 11/05/2022 0202 to 11/05/2022 0956  ABNORMALITY - Periodic discharges with triphasic morphology, generalized  and lateralized left hemisphere - Continuous slow, generalized  IMPRESSION: This study showed periodic discharges with triphasic morphology which now appears more predominantly in left hemisphere. This pattern is on the ictal-interictal continuum specifically the lateralized nature of the discharges which can suggest underlying cortical abnormality. Creutzfeldt-Jakob disease (CJD) can have similar EEG appearance although EEG is not diagnostic. Additionally there is moderate diffuse encephalopathy. No definite seizures were seen throughout the recording.  Assessment: 57 year old female with relatively abrupt onset severe confusion with cortical ribboning on MRI. Symptoms of rapidly and progressively worsening confusion and altered mental status are endorsed by family as having been going on for about 3 weeks. Possibilities include encephalitis, autoimmune or infectious, though a bacterial or viral infectious etiology is less likely given no fever and negative meningitis/encephalitis CSF PCR panel.  - Exam reveals a severely abulic patient without myoclonus or tremor. Tone is normal, with no waxy rigidity noted.  - CSF labs do not militate in favor of infection. There is mild nonspecific elevation of CSF WBCs. Protein and glucose are normal.  - EEG shows no electrographic seizures to suggest an epileptic etiology for her brain lesions. However, the triphasic waves are suggestive of possible CJD. Per the literature, typical EEG findings in CJD include bi-anterior dominant periodic triphasic sharp wave complexes, lasting 600-1051ms, occuring at a rate of 0.5 to 2Hz .  - MRI findings as documented above include multifocal  cortical ribboning and anterior basal ganglia diffuse hyperintensity.   - Acute thiamine deficiency is  unlikely, but will treat with empiric thiamine - DDx:  - CJD  - Paraneoplastic syndrome - Autoimmune encephalitis such as a rare imaging presentation of ADEM - Arbovirus or other CNS-trophic virus not detectable on CSF PCR panel is also possible, and is felt to be an intermediate possibility based on literature review of cases including MRI imaging.  - The imaging findings are not typical for Lyme disease, which tends more to affect the white matter.  - Imaging also not very consistent with RMSF, which tends to show multiple much smaller lesions in the white matter (aka "starry sky" pattern), rather than the grey matter involvement seen on MRI of our patient.      Recommendations: - Continue LTM - Continue high-dose empiric thiamine - Continue Keppra 1 g twice daily after 2 g load - RT-QuIC and 14-3-3 ordered along with tau as a send out to the [facility] at [facility]. The approximate current sensitivity of CSF RT-QuIC is 92% and the specificity is 100%.  - No specific isolation precautions for suspected CJD specific to [place] can be found on literature search. Per [facility] guidelines, there is no evidence that normal social or routine contact with a CJD patient represents a risk for healthcare workers, household members, or others. Isolation of patients with CJD is not necessary for routine examinations, etc. However, IF there are any interventions, equipment has to be sequestered (or all disposable) until a diagnosis is found. .  - CT of chest/abdomen/pelvis with contrast to assess for possible malignancy  - Has been seen by ID at [facility], but has not yet been seen by ID team at [facility]. Obtaining ID consult here. Discussed with Dr. Drue Second via SecureChat.  - Consider empiric IV steroids if ID agrees that benefits for treatment of a possible autoimmune process outweigh the likelihood of complications should this be a rare arbovirus or other rare neurotrophic  virus - Paraneoplastic panel pending    Patient seen and examined by NP/APP.  Elmer Picker, DNP, FNP-BC [facility] Pager: [phone]  Electronically signed: Dr. Caryl Pina

## 2022-11-05 NOTE — Progress Notes (Signed)
ID PROGRESS NOTE   Will follow patient during hospitalization during CJD work up. Currently on doxycycline at this time, not needing other abtx.  Duke Salvia Drue Second MD MPH [facility] [phone]

## 2022-11-05 NOTE — Evaluation (Signed)
Physical Therapy Evaluation Patient Details Name: Jody Nolan MRN: [id number] DOB: Aug 18, 1965 Today's Date: 11/05/2022  History of Present Illness  Pt is a 57 y/o female presenting on [facility] on 7/10 with worsening mental status. MRI brain showed lateral abnormal cortical diffusion and FLAIR signal with severe encephalopathy. EEG study revealed periodic discharges with triphasic morphology which now appears more predominantly in left hemisphere. This pattern is on the ictal-interictal continuum specifically the lateralized nature of the discharges which can suggest underlying cortical abnormality. No signifcant PMH.   Clinical Impression  Pt with family members in the room upon arrival, confirms information at this time due to decreased level of communication from pt. Reports for independence in functional mobility and ADL's PTA. Today, pt cooperative and agreeable to therapy. Overall, with increased time for mobility and command following but able to complete stand and room distance ambulation to the bathroom with min G or less assist, HHA provided. Pt demos narrow base of support, step-to pattern, and hesitation for directional changes. Pt mouths some words when prompted, tracks multimodal stimulus, and completes simple one-step commands. HR recorded seated EOB 114 BPM, peaks at 143 BPM with ambulation, and decreased to 125 BPM with seated rest break. Family reports plan is for pt to return to Mother's home, who is available for 24/hr care, pending d/c. Current rec is no PT follow up with 24 hr care.      Assistance Recommended at Discharge Frequent or constant Supervision/Assistance  If plan is discharge home, recommend the following:  Can travel by private vehicle  A little help with walking and/or transfers;A little help with bathing/dressing/bathroom;Assistance with cooking/housework;Assistance with feeding;Direct supervision/assist for medications management;Direct supervision/assist for  financial management;Help with stairs or ramp for entrance;Assist for transportation        Equipment Recommendations None recommended by PT  Recommendations for Other Services       Functional Status Assessment Patient has had a recent decline in their functional status and demonstrates the ability to make significant improvements in function in a reasonable and predictable amount of time.     Precautions / Restrictions Precautions Precautions: Fall;Other (comment) Precaution Comments: watch HR, LTM EEG Restrictions Weight Bearing Restrictions: No      Mobility  Bed Mobility Overal bed mobility: Needs Assistance Bed Mobility: Supine to Sit     Supine to sit: Supervision     General bed mobility comments: Initiates and completes EOB dangle with frequent verbal cueing. Slow moving.    Transfers Overall transfer level: Needs assistance Equipment used: None Transfers: Sit to/from Stand Sit to Stand: Min guard           General transfer comment: Adequate power to stand. Slow rise.    Ambulation/Gait Ambulation/Gait assistance: Min guard Gait Distance (Feet): 15 Feet Assistive device: None Gait Pattern/deviations: Step-to pattern, Decreased stride length, Narrow base of support, Decreased step length - left, Decreased step length - right   Gait velocity interpretation: <1.8 ft/sec, indicate of risk for recurrent falls   General Gait Details: Short steps with no step-through, hesitant with direction change. Narrow BOS. Demos bilateral turning, side stepping, and backwards stepping.  Stairs            Wheelchair Mobility     Tilt Bed    Modified Rankin (Stroke Patients Only)       Balance Overall balance assessment: Needs assistance Sitting-balance support: Feet supported, No upper extremity supported Sitting balance-Leahy Scale: Good     Standing balance support: No upper extremity supported,  During functional activity Standing balance-Leahy  Scale: Fair Standing balance comment: Able to transfer and amb without UE support, although reaches for support with changes in direction or commands.                             Pertinent Vitals/Pain Pain Assessment Pain Assessment: Faces Faces Pain Scale: No hurt Pain Intervention(s): Monitored during session    Home Living Family/patient expects to be discharged to:: Private residence Living Arrangements: Parent (mother) Available Help at Discharge: Family;Available 24 hours/day Type of Home: House Home Access: Level entry       Home Layout: One level   Additional Comments: Family present in room reports pt will return to mother's home (home living setup inputted) upon d/c. (Simultaneous filing. User may not have seen previous data.)    Prior Function Prior Level of Function : Independent/Modified Independent             Mobility Comments: No AD at baseline       Hand Dominance        Extremity/Trunk Assessment   Upper Extremity Assessment Upper Extremity Assessment: Defer to OT evaluation    Lower Extremity Assessment Lower Extremity Assessment: RLE deficits/detail;LLE deficits/detail RLE Deficits / Details: At least 4/5 for all LE MMT. LLE Deficits / Details: Same as RLE    Cervical / Trunk Assessment Cervical / Trunk Assessment: Normal  Communication   Communication: Other (comment) (pt non verbal at this time, intermittent mouthing of words, very soft spoken)  Cognition Arousal/Alertness: Awake/alert Behavior During Therapy: Flat affect Overall Cognitive Status: Impaired/Different from baseline Area of Impairment: Following commands, Problem solving                       Following Commands: Follows one step commands with increased time     Problem Solving: Slow processing, Decreased initiation, Requires verbal cues, Requires tactile cues General Comments: Pt benefits from frequent verbal cueing for initiation and sequencing.  Pt w increased time for some commands in mobility, hesitancy with change of direction or new task.        General Comments General comments (skin integrity, edema, etc.): VSS on RA, HR at EOB 114, peaks at 143 BPM during ambulation, decreases to 125 BPM with seated rest break.    Exercises     Assessment/Plan    PT Assessment Patient needs continued PT services  PT Problem List Decreased balance;Decreased mobility;Decreased cognition;Decreased safety awareness       PT Treatment Interventions Functional mobility training;Therapeutic activities;Cognitive remediation;Balance training    PT Goals (Current goals can be found in the Care Plan section)  Acute Rehab PT Goals Patient Stated Goal: pt unable to participate in goal setitng at this time PT Goal Formulation: With patient Time For Goal Achievement: 11/19/22 Potential to Achieve Goals: Fair    Frequency Min 3X/week     Co-evaluation PT/OT/SLP Co-Evaluation/Treatment: Yes Reason for Co-Treatment: Complexity of the patient's impairments (multi-system involvement) PT goals addressed during session: Mobility/safety with mobility         AM-PAC PT "6 Clicks" Mobility  Outcome Measure Help needed turning from your back to your side while in a flat bed without using bedrails?: None Help needed moving from lying on your back to sitting on the side of a flat bed without using bedrails?: None Help needed moving to and from a bed to a chair (including a wheelchair)?: A Little Help needed standing  up from a chair using your arms (e.g., wheelchair or bedside chair)?: A Little Help needed to walk in hospital room?: A Little Help needed climbing 3-5 steps with a railing? : A Lot 6 Click Score: 19    End of Session Equipment Utilized During Treatment: Gait belt Activity Tolerance: Patient tolerated treatment well Patient left: in bed;with call bell/phone within reach;with bed alarm set;with family/visitor present Nurse  Communication: Mobility status PT Visit Diagnosis: Other symptoms and signs involving the nervous system (R29.898);Other abnormalities of gait and mobility (R26.89);Unsteadiness on feet (R26.81);Difficulty in walking, not elsewhere classified (R26.2)    Time: 1610-9604 PT Time Calculation (min) (ACUTE ONLY): 40 min   Charges:   PT Evaluation $PT Eval Moderate Complexity: 1 Mod PT Treatments $Therapeutic Activity: 8-22 mins PT General Charges $$ ACUTE PT VISIT: 1 Visit         Hendricks Milo, SPT  Acute Rehabilitation Services   Hendricks Milo 11/05/2022, 1:39 PM

## 2022-11-05 NOTE — Procedures (Addendum)
Patient Name: Jody Nolan  MRN: [id number]  Epilepsy Attending: Charlsie Quest  Referring Physician/Provider: Milon Dikes, MD  Duration: 11/05/2022 0202 to 11/06/2022 0202   Patient history: 57 year old female with a history of altered mental status the past 3 weeks. EEG to evaluate for seizure    Level of alertness: Awake, asleep   AEDs during EEG study: LEV   Technical aspects: This EEG study was done with scalp electrodes positioned according to the 10-20 International system of electrode placement. Electrical activity was reviewed with band pass filter of 1-70Hz , sensitivity of 7 uV/mm, display speed of 55mm/sec with a 60Hz  notched filter applied as appropriate. EEG data were recorded continuously and digitally stored.  Video monitoring was available and reviewed as appropriate.   Description: No clear posterior dominant rhythm was seen. EEG showed continuous generalized 3 to 6 Hz theta-delta slowing. Intermittent generalized and lateralized left hemisphere periodic discharges with triphasic morphology were noted at 1-1.5 Hz.. Hyperventilation and photic stimulation were not performed.      ABNORMALITY - Periodic discharges with triphasic morphology, generalized  and lateralized left hemisphere - Continuous slow, generalized   IMPRESSION: This study showed periodic discharges with triphasic morphology which now appears more predominantly in left hemisphere. This pattern is on the ictal-interictal continuum specifically the lateralized nature of the discharges which can suggest underlying cortical abnormality. Creutzfeldt-Jakob disease (CJD) can have similar EEG appearance although EEG is not diagnostic. Additionally there is moderate diffuse encephalopathy. No definite seizures were seen throughout the recording.   Jody Nolan

## 2022-11-05 NOTE — Progress Notes (Signed)
[facility] Progress Note Patient: Jody Nolan WUJ:[id number] DOB: 1965/09/30 DOA: 11/03/2022  DOS: the patient was seen and examined on 11/05/2022  Brief hospital course: PMH of depression, anxiety, DU B presented to the hospital with complaints of confusion. Admitted at [facility] 7/11.  Neurology was consulted as well as ID was consulted. Underwent further workup including an LP as well as MRI brain and EEG.  MRI brain showed by lateral abnormal cortical diffusion and FLAIR signal and EEG showed triphasic waves as well as severe encephalopathy.  Patient was started on IV Keppra and was sent to [facility] for long-term EEG monitoring. 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 Etiology still not clear. Concern for seizures. Appreciate neurology follow-up. Workup including RPR, HIV, TSH, B12 unremarkable. B1 pending. Thyroid antibodies unremarkable. ANA pending. Autoimmune encephalitis panel pending heavy metal screen pending.  Lyme serology negative. Started on IV doxycycline for now.  ID following,  Started on IV Keppra. Continuing LTM EEG per neurology. Further workup and therapy per neurology. Improving mentation on 7/14 with ability to follow commands very easily.  Continues to have issues with mutism.  Repeating same answers.   Hypokalemia. Replaced.  History of anxiety and depression. Not on any medication. Currently undergoing significant stress in her life unsure if that is contributing to her acute worsening. Once organic issues are treated, may require psychiatry consultation as well.  Unintentional weight loss. Reported history.  Unclear about exact extent of weight loss. Could be related to mood disorder. Monitor for now. Receiving thiamine therapy for possible encephalopathy related to that.  History of tick bite. Reported history of tick bite. Concern for CNS encephalitis in the setting of tickborne  illness. Currently on IV doxycycline. My suspicion for that is less. ID has seen the patient at [facility].  ID following.  Will monitor.  Reported right-sided weakness. There is no examination on 7/14 patient does not appear to have any significant swelling edema or tenderness in the right upper extremity. Equal strength in bilateral examination as well.   Subjective: No nausea no vomiting no fever no chills.  Per family she appears to be progressively getting worse every day, now exhibiting more mutism and lack of desire.  Compared to before appetite is also reduced. Although on examination patient is able to follow commands much easily compared to yesterday and is actually more vocal compared to yesterday.  Physical Exam: General: in Mild distress, No Rash Cardiovascular: S1 and S2 Present, No Murmur Respiratory: Good respiratory effort, Bilateral Air entry present. No Crackles, No wheezes Abdomen: Bowel Sound present, No tenderness Extremities: No edema Neuro: Alert and oriented x self, was answering with her name for further orientation questions No new focal deficit.  No asterixis.  No tremors.  No myoclonus.  Able to follow commands to perform extraocular eye movement which was normal  Data Reviewed: I have Reviewed nursing notes, Vitals, and Lab results. Since last encounter, pertinent lab results CBC and BMP   . I have ordered test including BMP  . I have discussed pt's care plan and test results with neurology  .   Disposition: Status is: Inpatient Remains inpatient appropriate because: Need further workup and improvement in encephalopathy  Place and maintain sequential compression device Start: 11/05/22 1619   Family Communication: Family at bedside Level of care: Telemetry Medical continue Vitals:   11/05/22 0346 11/05/22 0658 11/05/22 0700 11/05/22 1500  BP: 121/77 116/79 122/82 (!) 128/105  Pulse: 97 87 87 90  Resp: 20 16 13 15   Temp: 99.2 F (37.3 C) 99.1 F (37.3 C)  99 F (37.2 C)   TempSrc: Oral Oral Oral Oral  SpO2: 99% 100% 99% 98%     Author: Lynden Oxford, MD 11/05/2022 4:23 PM  Please look on [email or web address] to find out who is on call.

## 2022-11-06 DIAGNOSIS — G9340 Encephalopathy, unspecified: Secondary | ICD-10-CM | POA: Diagnosis not present

## 2022-11-06 DIAGNOSIS — A86 Unspecified viral encephalitis: Secondary | ICD-10-CM

## 2022-11-06 LAB — MISC LABCORP TEST (SEND OUT): Labcorp test code: 505535

## 2022-11-06 LAB — ANA W/REFLEX IF POSITIVE: Anti Nuclear Antibody (ANA): NEGATIVE

## 2022-11-06 LAB — PATHOLOGIST SMEAR REVIEW

## 2022-11-06 MED ORDER — SODIUM CHLORIDE 0.9 % IV SOLN
1000.0000 mg | INTRAVENOUS | Status: AC
  Administered 2022-11-06 – 2022-11-10 (×5): 1000 mg via INTRAVENOUS
  Filled 2022-11-06 (×5): qty 16

## 2022-11-06 MED ORDER — ORAL CARE MOUTH RINSE
15.0000 mL | OROMUCOSAL | Status: DC
  Administered 2022-11-06 – 2022-11-16 (×32): 15 mL via OROMUCOSAL

## 2022-11-06 MED ORDER — DOXYCYCLINE HYCLATE 100 MG PO TABS
100.0000 mg | ORAL_TABLET | Freq: Two times a day (BID) | ORAL | Status: DC
  Administered 2022-11-06 – 2022-11-07 (×2): 100 mg via ORAL
  Filled 2022-11-06 (×2): qty 1

## 2022-11-06 MED ORDER — ORAL CARE MOUTH RINSE: 15.0000 mL | OROMUCOSAL | Status: DC | PRN

## 2022-11-06 MED ORDER — BISACODYL 10 MG RE SUPP
10.0000 mg | Freq: Once | RECTAL | Status: DC
  Filled 2022-11-06: qty 1

## 2022-11-06 MED ORDER — PANTOPRAZOLE SODIUM 40 MG IV SOLR
40.0000 mg | INTRAVENOUS | Status: AC
  Administered 2022-11-06 – 2022-11-10 (×5): 40 mg via INTRAVENOUS
  Filled 2022-11-06 (×5): qty 10

## 2022-11-06 MED ORDER — DOXYCYCLINE HYCLATE 100 MG PO TABS: 100.0000 mg | ORAL_TABLET | Freq: Two times a day (BID) | ORAL | Status: DC

## 2022-11-06 NOTE — Progress Notes (Addendum)
[facility]    Date of Admission:  11/03/2022   Total days of antibiotics 4           ID: Jody Nolan is a 57 y.o. female with anxiety, who started to have changes in mental status at least 3 weeks ago, but maybe longer that started with confusion and recall. 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 At beginning of July, she was found disoriented and confused at her apartment complex. A few days prior to his episode, she was at [facility] for removal of tick and prescribed doxycycline. She was brought up to [facility] (to [place]) by her family on 7/10 for evaluation  She underwent LP: CSF 17, TP 41, glu 70. Cx negative  EEG: periodic discharges with triphasic morphology, generalized and lateralized left hemisphere  Acute changes to her symptoms concerning for auto-immune encephalitis, possibly CJD, less likely tickborne encephalitis  Chest abd pelvis CT ruled out malignancy  MRI showing multiple abn:  However, there is multifocal bilateral abnormal cortical diffusion of the brain, most apparent in the parietal lobes and greater on the left (series 5 images 33 and 36), but also affecting the posterolateral left temporal lobe (image 21). See also coronal DWI images 28 and 29. There is associated cortical FLAIR hyperintensity in some of those areas, but the underlying white matter seems spared.   At the same time, there is similar asymmetric diffusion and FLAIR hyperintensity also in the left caudate nucleus (series 5 image 25). No mass effect associated in these areas. No T1 signal abnormality although rapid T1 imaging was utilized to minimize motion.   And furthermore, there could be abnormally exaggerated cortical diffusion throughout the anterior frontal lobes, versus artifact (series 5, image 35). Cortical FLAIR hyperintensity there also difficult to exclude.   No abnormal enhancement identified. No dural thickening.  No superimposed ventriculomegaly, ventricular debris, extra-axial collection or convincing acute intracranial hemorrhage.   Cervicomedullary junction and pituitary are within normal limits. No hemosiderin or mineralization on T2* imaging. Outside of the left caudate nucleus the deep gray nuclei, brainstem and cerebellum appear negative.   Vascular: Major intracranial vascular flow voids appear preserved.   Skull and upper cervical spine: Negative visible cervical spine. Visualized bone marrow signal is within normal limits.   Sinuses/Orbits: Disconjugate gaze but otherwise negative orbits. Moderate bilateral paranasal sinus mucosal thickening and opacification, possibly with periosteal thickening on series 10, image 5.   Other: Mastoids are clear. Grossly normal visible internal auditory structures. Negative visible scalp and face.   IMPRESSION: Widespread, bilateral abnormal cortical diffusion and FLAIR signal in a nonspecific pattern. Suspect asymmetric caudate nucleus involvement also on the left. But white matter seems spared, there is no associated abnormal enhancement, and no intracranial mass effect.     ROS: 80 weight loss  Principal Problem:   Acute encephalopathy Active Problems:   Anxiety and depression   Hypokalemia    Subjective: Needing assistance going to the bathroom. Speech sparse,   Medications:   bisacodyl  10 mg Rectal Once   doxycycline  100 mg Oral BID AC & HS   feeding supplement  237 mL Oral BID BM   levETIRAcetam  1,000 mg Oral BID   mouth rinse  15 mL Mouth Rinse 4 times per day   pantoprazole (PROTONIX) IV  40 mg Intravenous Q24H   sodium chloride flush  3 mL Intravenous Q12H   [START ON 11/07/2022] thiamine  100 mg Oral Daily    Objective: Vital signs in last 24 hours: Temp:  [98.4 F (36.9 C)-99.3 F (37.4 C)] 98.6 F (37 C) (07/15 1518) Pulse Rate:  [100-182] 179 (07/15 1518) Resp:  [14-20] 18 (07/15 1518) BP:  (119-139)/(78-96) 125/78 (07/15 1518) SpO2:  [85 %-95 %] 92 % (07/15 1518)  Physical Exam  Constitutional:  =appears well-developed and well-nourished. No distress.  HENT: [place]/AT, PERRLA, no scleral icterus Mouth/Throat: Oropharynx is clear and moist. No oropharyngeal exudate.  Cardiovascular: Normal rate, regular rhythm and normal heart sounds. Exam reveals no gallop and no friction rub.  No murmur heard.  Pulmonary/Chest: Effort normal and breath sounds normal. No respiratory distress.  has no wheezes.  Neck = supple, no nuchal rigidity Abdominal: Soft. Bowel sounds are normal.  exhibits no distension. There is no tenderness.  Lymphadenopathy: no cervical adenopathy. No axillary adenopathy Neurological: eyes closed, intermittently following commands Skin: Skin is warm and dry. No rash noted. No erythema.  Psychiatric: a normal mood and affect.  behavior is normal.    Lab Results Recent Labs    11/04/22 0408 11/04/22 0733 11/05/22 0423  WBC  --  5.8 6.3  HGB  --  13.4 13.5  HCT  --  40.7 40.9  NA 138  --  140  K 3.6  --  3.5  CL 108  --  107  CO2 22  --  25  BUN 8  --  8  CREATININE 0.60  --  0.69    Sedimentation Rate Recent Labs    11/03/22 1658  ESRSEDRATE 10   C-Reactive Protein Recent Labs    11/03/22 1658  CRP 0.5    Microbiology: reviewed Studies/Results: CT CHEST ABDOMEN PELVIS W CONTRAST  Result Date: 11/05/2022 CLINICAL DATA:  Metastatic disease evaluation. EXAM: CT CHEST, ABDOMEN, AND PELVIS WITH CONTRAST TECHNIQUE: Multidetector CT imaging of the chest, abdomen and pelvis was performed following the standard protocol during bolus administration of intravenous contrast. RADIATION DOSE REDUCTION: This exam was performed according to the departmental dose-optimization program which includes automated exposure control, adjustment of the mA and/or kV according to patient size and/or use of iterative reconstruction technique. CONTRAST:  75mL OMNIPAQUE  IOHEXOL 350 MG/ML SOLN COMPARISON:  None Available. FINDINGS: CT CHEST FINDINGS Cardiovascular: No significant vascular findings. Normal heart size. No pericardial effusion. Mediastinum/Nodes: No enlarged mediastinal, hilar, or axillary lymph nodes. Thyroid gland, trachea, and esophagus demonstrate no significant findings. Lungs/Pleura: There is a calcified granuloma in the right lung apex. There is minimal atelectasis in both lung bases. The lungs are otherwise clear. There is no pleural effusion or pneumothorax. Musculoskeletal: No chest wall mass or suspicious bone lesions identified. CT ABDOMEN PELVIS FINDINGS Hepatobiliary: Gallstones are present. The gallbladder appears contracted. There is questionable wall thickening of the neck of the gallbladder. There is no biliary ductal dilatation. There is no surrounding inflammation. There is a single cyst in the left lobe of the liver measuring 1.9 cm. No other liver lesions are identified. Pancreas: Unremarkable. No pancreatic ductal dilatation or surrounding inflammatory changes. Spleen: Normal in size without focal abnormality. Adrenals/Urinary Tract: Adrenal glands are unremarkable. Kidneys are normal, without renal calculi, focal lesion, or hydronephrosis. Bladder is unremarkable. Stomach/Bowel: Stomach is within normal limits. Appendix appears normal. No evidence of bowel wall thickening, distention, or inflammatory changes. There is a large amount of stool distending the rectum. Vascular/Lymphatic: Aortic atherosclerosis. No enlarged abdominal or pelvic lymph nodes. Reproductive: Status post hysterectomy. No adnexal masses. Other: No abdominal  wall hernia or abnormality. No abdominopelvic ascites. Musculoskeletal: No acute or significant osseous findings. IMPRESSION: 1. No evidence for metastatic disease in the chest, abdomen or pelvis. 2. Cholelithiasis with questionable wall thickening of the neck of the gallbladder. Recommend further evaluation with right  upper quadrant ultrasound. 3. Large amount of stool distending the rectum. Aortic Atherosclerosis (ICD10-I70.0). Electronically Signed   By: Darliss Cheney M.D.   On: 11/05/2022 19:38    Assessment/Plan: Acute encephalitis - undergoing work-up for auto-immune encephalitis as well as CJD. I wouldn't expect elevated mild pleocytosis in CJD, but I suspect that could still be high on the differential. I agree with plan to start empiric treatment for auto-immune encephalitis to see if any improvement  I think low likelihood that this is tickborne encephalitis. Will check with [facility] to see studies have returned from CSF. Can stop doxycycline.  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 Professional time spent includes the following activities: Preparing to see the patient (review of tests), Obtaining and/or reviewing separately obtained history (admission/discharge record), Performing a medically appropriate examination and/or evaluation , Ordering medications/tests/procedures, referring and communicating with other health care professionals, Documenting clinical information in the EMR, Independently interpreting results (not separately reported), Communicating results to the patient/family/caregiver, Counseling and educating the patient/family/caregiver and Care coordination (not separately reported).     [facility] Pager: [phone]  11/06/2022, 3:44 PM

## 2022-11-06 NOTE — Progress Notes (Signed)
LTM EEG disconnected - no skin breakdown at unhook.  

## 2022-11-06 NOTE — Evaluation (Signed)
Speech Language Pathology Evaluation Patient Details Name: Jody Nolan MRN: [id number] DOB: 07-Nov-1965 Today's Date: 11/06/2022 Time: 4098-1191 SLP Time Calculation (min) (ACUTE ONLY): 31 min  Problem List:  Patient Active Problem List   Diagnosis Date Noted   Acute encephalopathy 11/03/2022   Seizure (HCC) 11/03/2022   Disorientation 11/02/2022   Acute metabolic encephalopathy 11/02/2022   Hypokalemia 11/02/2022   Encounter for screening mammogram for malignant neoplasm of breast 05/01/2022   Menopausal vasomotor syndrome 05/01/2022   Overweight with body mass index (BMI) of 28 to 28.9 in adult 05/01/2022   Cervical cancer screening declined 05/01/2022   Screening mammogram for breast cancer 03/11/2021   Prediabetes 03/11/2021   Annual physical exam 03/11/2021   Anxiety and depression 03/11/2021   H/O cold sores 03/11/2021   Past Medical History:  Past Medical History:  Diagnosis Date   Allergic rhinitis    Degeneration of cervical intervertebral disc    DUB (dysfunctional uterine bleeding)    Family history of adverse reaction to anesthesia    Mothr - PONV   Family history of colon cancer    History of abnormal cervical Pap smear    History of chicken pox    Radiculopathy    Reaction, stress, acute    Shoulder pain    Past Surgical History:  Past Surgical History:  Procedure Laterality Date   ABDOMINAL HYSTERECTOMY     partial   CESAREAN SECTION  2000   COLONOSCOPY WITH PROPOFOL N/A 03/29/2018   Procedure: COLONOSCOPY WITH BIOPSY;  Surgeon: Midge Minium, MD;  Location: [facility];  Service: Endoscopy;  Laterality: N/A;   IR LUMBAR PUNCTURE  11/03/2022   LEEP  1990's   POLYPECTOMY N/A 03/29/2018   Procedure: POLYPECTOMY;  Surgeon: Midge Minium, MD;  Location: [facility];  Service: Endoscopy;  Laterality: N/A;   HPI:  Pt is a 57 yo female presenting to [facility] 7/10 with several weeks of worsening AMS; transferred to [facility] 7/12. MRI of brain showed  widespread, bilateral abnormal cortical diffusion and FLAIR signal in a nonspecific pattern. PMH includes: depression with anxiety, pre-DM, overweight, degenerative cervical intervertebral disc disease with radiculopathy, dysfunction uterine bleeding, cold sore   Assessment / Plan / Recommendation Clinical Impression  Pt has limited verbal output, saying her first name Arline Asp") x2 after initial model was provided by SLP. Her husband shares that when her niece came to visit, she was able to have a conversation, although this was not observed during SLP evaluation. Pt nodded her head "yes" a few times in response to questions or social greeting, but more often without response even to yes/no questions. Her affect is flat, but she does make eye contact and appears to visually discriminate between two common objects even though she does not initiate pointing or gesturing without cues. She also needs cues to initiate use of familiar objects and does not state their names during confrontational naming or repetition trials. Throughout this eval she had moments in which she seemed very distracted by her environment. Recommend ongoing SLP f/u to facilitate cognitive-linguistic function as medical work up continues.    SLP Assessment  SLP Recommendation/Assessment: Patient needs continued Speech Lanaguage Pathology Services SLP Visit Diagnosis: Cognitive communication deficit (R41.841)    Recommendations for follow up therapy are one component of a multi-disciplinary discharge planning process, led by the attending physician.  Recommendations may be updated based on patient status, additional functional criteria and insurance authorization.    Follow Up Recommendations  Outpatient SLP  Assistance Recommended at Discharge  Frequent or constant Supervision/Assistance  Functional Status Assessment Patient has had a recent decline in their functional status and demonstrates the ability to make significant  improvements in function in a reasonable and predictable amount of time.  Frequency and Duration min 2x/week  2 weeks      SLP Evaluation Cognition  Overall Cognitive Status: Difficult to assess Arousal/Alertness: Awake/alert Orientation Level: Oriented to person Attention: Sustained Sustained Attention: Impaired Sustained Attention Impairment: Verbal basic       Comprehension  Auditory Comprehension Overall Auditory Comprehension: Impaired Yes/No Questions: Impaired Basic Biographical Questions: 0-25% accurate Commands: Impaired One Step Basic Commands: 25-49% accurate Visual Recognition/Discrimination Discrimination: Exceptions to [facility] Common Objects: Able in field of 2 (inconsistently) Reading Comprehension Reading Status: Impaired Word level:  (name)    Expression Expression Primary Mode of Expression: Verbal Verbal Expression Overall Verbal Expression: Impaired Initiation: Impaired Automatic Speech: Name (after initial model) Repetition: Impaired Level of Impairment: Word level Naming: Impairment Confrontation: Impaired Common Objects: Able in field of 2 (inconsistently) Pragmatics: Impairment Impairments: Eye contact;Abnormal affect Non-Verbal Means of Communication: Not applicable   Oral / Motor  Oral Motor/Sensory Function Overall Oral Motor/Sensory Function: Within functional limits Motor Speech Overall Motor Speech: Other (comment) (needs further assessment)            Mahala Menghini., M.A. CCC-SLP Acute Rehabilitation Services Office [phone]  Secure chat preferred  11/06/2022, 3:07 PM

## 2022-11-06 NOTE — Progress Notes (Signed)
Fixed leads.  All under 10.  No visible skin breakdown

## 2022-11-06 NOTE — Progress Notes (Signed)
Neurology Progress Note  Brief HPI: 57 year old female with relatively abrupt onset 3 weeks ago of progressive and precipitous cognitive decline, with first symptom being short term memory impairment, forgetting key details of her daughter's stated plans for a trip multiple times, manifested as forgetting over time frames of a few hours despite repeatedly being informed/corrected regarding the information she was not retaining. A few days later, her behavior started to become altered and somewhat inappropriate. She had complained of a recent tick bite without an associate rash and was prescribed with Doxycycline for a presumed tick-borne illness at [facility] at some point. Symptoms progressed to severe confusion requiring admission to the hospital in [facility], near her residence there. She was determined to be safe for discharge after some testing that did not yield a diagnosis. Family then drove her up to [place] to be watched closely and cared for by them, as she seemed to be precipitously losing her ability to manage her own affairs. After further decline, they brought her to [facility] to be evaluated. Work up there included LP as well as MRI brain, which revealed multifocal cortical ribboning on DWI and T2, as well as abnormal basal ganglia signal changes, but no abnormal enhancement.   Again, symptoms of progressively worsening confusion and altered mental status are thought to have been going on for about 3 weeks. Possibilities include autoimmune or infectious encephalitis, though a bacterial or one of the more common viral infectious etiologies are less likely given no fever and negative meningitis/encephalitis CSF PCR panel. Exam reveals a severely abulic patient without myoclonus or tremor. Tone is normal, with no waxy rigidity noted. CSF labs negative for infection so far. There is mild nonspecific elevation of CSF WBCs. CSF Protein and glucose are normal. EEG with triphasic waves.    Subjective: Patient seen in room with family at the bedside.  Speech is very sparse and quiet, and patient will only intermittently follow commands.  She keeps her eyes closed and does not appear to recognize others in room  Beta Isoform, RT-QuIC and tau protein have been ordered as a send-out panel to the [facility] at [facility] from the remaining CSF at [facility] to assess for possible CJD. Spoke with lab at [facility] and it is being sent. Pathology and Infection Prevention notified as well.  Exam: Vitals:   11/06/22 0744 11/06/22 1121  BP: (!) 139/96 (!) 139/91  Pulse:  (!) 182  Resp: 16 18  Temp: 98.7 F (37.1 C) 99.3 F (37.4 C)  SpO2:  (!) 85%   Gen: In bed, NAD Resp: non-labored breathing, no acute distress Abd: soft, nt  Neuro: Mental Status: Awake with eyes closed but minimally interactive Cranial Nerves:  II: PERRL. Visual fields full to confrontation.  III, IV, VI: EOM intact. Eyelids elevate symmetrically.  V: Sensation intact V1-3 symmetrically  VII: no facial asymmetry   VIII: hearing intact to voice IX, X: Phonation is normal Motor:  RUE: 4/5         LUE: 4/5 RLE 4/5           LLE: 4/5  Tone is normal and bulk is normal No myoclonus or tremor noted.  Sensation- Intact to light touch bilaterally Coordination: Unable to perform Gait- Deferred     Pertinent Labs: Electrolytes normal CBC normal RPR negative UDS negative EtOH and salicylate: Negative Glucose 98 CRP - 0.5 Sed rate- 10 CSF labs resulted:             -  Clear and colorless - Meningitis/encephalitis panel- negative - Protein 41 - Glucose 70 - CSF WBC- mildly elevated at 17 - Cryptococcal antigen- negative CSF labs pending:  VDRL  IgG index Oligoclonal bands [place] IgG [place] IgM Ehrlichia Arbovirus IgG and IgM Autoimmune encephalitis Ab Paraneoplastic Ab  TSH- 2.862 T4, free- 1.04 Thyroglobulin antibody negative Thyroid peroxidase 26 Vitamin B1  pending Vitamin B12 normal ANA w/ reflex pending ANCA pending Heavy metals pending Lime disease serology negative HIV negative Cryptococcal antigen negative Beta isoform pending RT-quic pending Tile protein pending  Imaging Reviewed: CTA head and neck  1. Patent vasculature of the head and neck without hemodynamically significant or occlusion. No evidence of vasospasm or RCVS in the intracranial vasculature. 2. 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  3. 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   MRI brain Widespread, bilateral abnormal cortical diffusion with ribboning and correlating FLAIR signal in a nonspecific pattern. There is also asymmetric caudate nucleus involvement also on the left. But white matter seems spared, there is no associated abnormal enhancement, and no intracranial mass effect. Broad differential diagnosis at this point and recommend Neurology consultation with consideration of: Infectious Meningoencephalitis (although lack of enhancement argues against), Reversible cerebral vasoconstriction (RCVS), Urea cycle disorders/Uremia, hypoglycemia, Seizure disorder, PRES (although sparing of white matter argues against), Hepatic encephalopathy, Mitochondrial disorder, and Creutzfeldt-jakob disease.   LTM EEG 11/04/2022 0202 to 11/04/2022 0915  ABNORMALITY - Periodic discharges with triphasic morphology, generalized ( GPDs) - Continuous slow, generalized  IMPRESSION: This study showed generalized periodic discharges with triphasic morphology which can be on the ictal-interictal continuum. However, the morphology, frequency and reactivity to stimulation is more commonly suggestive of toxic-metabolic causes. Additionally there is moderate diffuse encephalopathy. No seizures were seen throughout the recording.  LTM EEG 11/05/2022  0202 to 11/05/2022 0956  ABNORMALITY - Periodic discharges with triphasic morphology, generalized  and lateralized left hemisphere - Continuous slow, generalized  IMPRESSION: This study showed periodic discharges with triphasic morphology which now appears more predominantly in left hemisphere. This pattern is on the ictal-interictal continuum specifically the lateralized nature of the discharges which can suggest underlying cortical abnormality. Creutzfeldt-Jakob disease (CJD) can have similar EEG appearance although EEG is not diagnostic. Additionally there is moderate diffuse encephalopathy. No definite seizures were seen throughout the recording.  LTM EEG 715 Periodic discharges with triphasic morphology, generalized and lateralized left hemisphere  Assessment: 57 year old female with relatively abrupt onset severe confusion with cortical ribboning on MRI. Symptoms of rapidly and progressively worsening confusion and altered mental status are endorsed by family as having been going on for about 3 weeks. Possibilities include encephalitis, autoimmune or infectious, though a bacterial or viral infectious etiology is less likely given no fever and negative meningitis/encephalitis CSF PCR panel.  - Exam reveals a severely abulic patient without myoclonus or tremor. Tone is normal, with no waxy rigidity noted.  - CSF labs do not militate in favor of infection. There is mild nonspecific elevation of CSF WBCs. Protein and glucose are normal.  - EEG shows no electrographic seizures to suggest an epileptic etiology for her brain lesions. However, the triphasic waves are suggestive of possible CJD. Per the literature, typical EEG findings in CJD include bi-anterior dominant periodic triphasic sharp wave complexes, lasting 600-1062ms, occuring at a rate of 0.5 to 2Hz .  - MRI findings as documented above include multifocal cortical ribboning  and anterior basal ganglia diffuse hyperintensity.   - Acute thiamine  deficiency is unlikely, but will treat with empiric thiamine - DDx:  - CJD  - Paraneoplastic syndrome - Autoimmune encephalitis such as a rare imaging presentation of ADEM, would be worthwhile to start high-dose steroids if infectious disease service has no objection to this - Arbovirus or other CNS-trophic virus not detectable on CSF PCR panel is also possible, and is felt to be an intermediate possibility based on literature review of cases including MRI imaging.  - The imaging findings are not typical for Lyme disease, which tends more to affect the white matter.  - Imaging also not very consistent with RMSF, which tends to show multiple much smaller lesions in the white matter (aka "starry sky" pattern), rather than the grey matter involvement seen on MRI of our patient.      Recommendations: -Discontinue LTM as no seizure activity has been seen - Continue high-dose empiric thiamine - Continue Keppra 1 g twice daily after 2 g load - RT-QuIC and 14-3-3 ordered along with tau as a send out to the [facility] at [facility]. The approximate current sensitivity of CSF RT-QuIC is 92% and the specificity is 100%.  - No specific isolation precautions for suspected CJD specific to [place] can be found on literature search. Per [facility] guidelines, there is no evidence that normal social or routine contact with a CJD patient represents a risk for healthcare workers, household members, or others. Isolation of patients with CJD is not necessary for routine examinations, etc. However, IF there are any interventions, equipment has to be sequestered (or all disposable) until a diagnosis is found. .  - CT of chest/abdomen/pelvis with contrast to assess for possible malignancy  - Has been seen by ID at [facility], but has not yet been seen by ID team at [facility]. Obtaining ID consult here. Discussed with Dr. Drue Second via SecureChat.  - started empiric IV steroids after clearance  from Hospitalist team. Hospitalist team also discussed with Idand obtianed their clearance per my discussion with them. - Paraneoplastic panel pending     Patient seen and examined by NP/APP and then by MD, MD to edit note is needed Cortney E Ernestina Columbia , MSN, AGACNP-BC [facility] See Amion for schedule and pager information 11/06/2022 12:39 PM   NEUROHOSPITALIST ADDENDUM Performed a face to face diagnostic evaluation.   I have reviewed the contents of history and physical exam as documented by PA/ARNP/Resident and agree with above documentation.  I have discussed and formulated the above plan as documented. Edits to the note have been made as needed.  Impression/Key exam findings/Plan: LTM EEG negative for seizure. High suspicion for potential CJD specially given her clinical course, MRI and EEG findings but waiting on a battery of infectious, autoimmune and CJD testing. CSF meningitis/encephalitis panel is negative, CSF cultures are negaive. I think it is reasonable to do IV solumedrol daily x 5 doses. If she improves, would help clarify the diagnosis and be therapeutic at the same time. We are still waiting on a battery of tests to come back.  I discussed plan in detail with patient's significant other and mother at the bedside.  Erick Blinks, MD [facility] [phone]   If 7pm to 7am, please call on call as listed on AMION.

## 2022-11-06 NOTE — Progress Notes (Signed)
[facility] Progress Note Patient: Jody Nolan WGN:[id number] DOB: 1965-05-09 DOA: 11/03/2022  DOS: the patient was seen and examined on 11/06/2022  Brief hospital course: PMH of depression, anxiety, DU B presented to the hospital with complaints of confusion. Admitted at [facility] 7/11.  Neurology was consulted as well as ID was consulted. Underwent further workup including an LP as well as MRI brain and EEG.  MRI brain showed by lateral abnormal cortical diffusion and FLAIR signal and EEG showed triphasic waves as well as severe encephalopathy.  Patient was started on IV Keppra and was sent to [facility] for long-term EEG monitoring. Now receiving IV steroids. 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 Etiology still not clear. Concern for seizures. Appreciate neurology follow-up. Workup including RPR, HIV, TSH, B12 unremarkable. B1 pending. Thyroid antibodies unremarkable. ANA pending. Autoimmune encephalitis panel pending heavy metal screen pending.  Lyme serology negative. Started on IV doxycycline for now.  ID following, likely can stop doxycycline. Started on IV Keppra. Had LTM EEG per neurology. Started on IV steroids on 7/15.  Hypokalemia. Replaced.  History of anxiety and depression. Not on any medication. Currently undergoing significant stress in her life unsure if that is contributing to her acute worsening. Once organic issues are treated, may require psychiatry consultation as well.  Unintentional weight loss. Reported history.  Unclear about exact extent of weight loss. Could be related to mood disorder. Monitor for now. Receiving thiamine therapy for possible encephalopathy related to that.  History of tick bite. Reported history of tick bite. Concern for CNS encephalitis in the setting of tickborne illness. Currently on IV doxycycline. My suspicion for that is less. ID has seen the patient at [facility].  ID  following.  Will monitor.  Reported right-sided weakness. There is no examination on 7/14 patient does not appear to have any significant swelling edema or tenderness in the right upper extremity. Equal strength in bilateral examination as well.   Subjective: I was called by RN to evaluate the patient as she was agitated after IV being removed.  Patient was able to follow commands and there was no agitation at the time of my evaluation.  Patient was able to redirect.  Physical Exam: Clear to auscultation. S1-S2 present. Right upper extremity mild swelling seen which is unchanged compared to yesterday. Bowel sound present. No asterixis. Alert awake.  More muted today compared to yesterday.  Data Reviewed: I have Reviewed nursing notes, Vitals, and Lab results. I have ordered test including CBC and BMP. I have discussed pt's care plan and test results with ID, neurology.   Disposition: Status is: Inpatient Remains inpatient appropriate because: Requiring IV steroids  Place and maintain sequential compression device Start: 11/05/22 1619   Family Communication: Family at bedside Level of care: Telemetry Medical   Vitals:   11/05/22 2315 11/06/22 0744 11/06/22 1121 11/06/22 1518  BP: (!) 139/92 (!) 139/96 (!) 139/91 125/78  Pulse:   (!) 182 (!) 179  Resp: 20 16 18 18   Temp: 99.1 F (37.3 C) 98.7 F (37.1 C) 99.3 F (37.4 C) 98.6 F (37 C)  TempSrc: Oral Oral Oral Oral  SpO2: 95%  (!) 85% 92%     Author: Lynden Oxford, MD 11/06/2022 6:34 PM  Please look on [email or web address] to find out who is on call.

## 2022-11-06 NOTE — Procedures (Addendum)
Patient Name: Jody Nolan  MRN: [id number]  Epilepsy Attending: Charlsie Quest  Referring Physician/Provider: Milon Dikes, MD  Duration: 11/06/2022 0202 to 11/06/2022 1105   Patient history: 57 year old female with a history of altered mental status the past 3 weeks. EEG to evaluate for seizure    Level of alertness: Awake, asleep   AEDs during EEG study: LEV   Technical aspects: This EEG study was done with scalp electrodes positioned according to the 10-20 International system of electrode placement. Electrical activity was reviewed with band pass filter of 1-70Hz , sensitivity of 7 uV/mm, display speed of 58mm/sec with a 60Hz  notched filter applied as appropriate. EEG data were recorded continuously and digitally stored.  Video monitoring was available and reviewed as appropriate.   Description: No clear posterior dominant rhythm was seen. EEG showed continuous generalized 3 to 6 Hz theta-delta slowing. Intermittent generalized and lateralized left hemisphere periodic discharges with triphasic morphology were noted at 1.5 Hz.. Hyperventilation and photic stimulation were not performed.      ABNORMALITY - Periodic discharges with triphasic morphology, generalized  and lateralized left hemisphere - Continuous slow, generalized   IMPRESSION: This study showed periodic discharges with triphasic morphology which now appears more predominantly in left hemisphere. This pattern is on the ictal-interictal continuum specifically the lateralized nature of the discharges which can suggest underlying cortical abnormality. Creutzfeldt-Jakob disease (CJD) can have similar EEG appearance although EEG is not diagnostic. Additionally there is moderate diffuse encephalopathy. No definite seizures were seen throughout the recording.   Broderick Fonseca Annabelle Harman

## 2022-11-06 NOTE — Plan of Care (Signed)

## 2022-11-07 DIAGNOSIS — G9340 Encephalopathy, unspecified: Secondary | ICD-10-CM | POA: Diagnosis not present

## 2022-11-07 LAB — COMPREHENSIVE METABOLIC PANEL
ALT: 25 U/L (ref 0–44)
AST: 31 U/L (ref 15–41)
Albumin: 4.1 g/dL (ref 3.5–5.0)
Alkaline Phosphatase: 40 U/L (ref 38–126)
Anion gap: 11 (ref 5–15)
BUN: 13 mg/dL (ref 6–20)
CO2: 23 mmol/L (ref 22–32)
Calcium: 10 mg/dL (ref 8.9–10.3)
Chloride: 103 mmol/L (ref 98–111)
Creatinine, Ser: 0.82 mg/dL (ref 0.44–1.00)
GFR, Estimated: >60 mL/min (ref >60)
Glucose, Bld: 151 mg/dL — ABNORMAL HIGH (ref 70–99)
Potassium: 4 mmol/L (ref 3.5–5.1)
Sodium: 137 mmol/L (ref 135–145)
Total Bilirubin: 1 mg/dL (ref 0.3–1.2)
Total Protein: 7.1 g/dL (ref 6.5–8.1)

## 2022-11-07 LAB — ANCA PROFILE
Anti-MPO Antibodies: <0.2 units (ref 0.0–0.9)
Anti-PR3 Antibodies: <0.2 units (ref 0.0–0.9)
Atypical P-ANCA titer: <1:20 {titer}
C-ANCA: <1:20 {titer}
P-ANCA: <1:20 {titer}

## 2022-11-07 LAB — VITAMIN B1: Vitamin B1 (Thiamine): 244 nmol/L — ABNORMAL HIGH (ref 66.5–200.0)

## 2022-11-07 LAB — MISC LABCORP TEST (SEND OUT)
Labcorp test code: 505625
Labcorp test code: 138842

## 2022-11-07 LAB — MAGNESIUM: Magnesium: 2.2 mg/dL (ref 1.7–2.4)

## 2022-11-07 LAB — PHOSPHORUS: Phosphorus: 3.8 mg/dL (ref 2.5–4.6)

## 2022-11-07 LAB — HEAVY METALS, BLOOD
Arsenic: 2 ug/L (ref 0–9)
Lead: 1 ug/dL (ref 0.0–3.4)
Mercury: 3.6 ug/L (ref 0.0–14.9)

## 2022-11-07 MED ORDER — DEXTROSE IN LACTATED RINGERS 5 % IV SOLN
INTRAVENOUS | Status: DC
Start: 2022-11-07 — End: 2022-11-07

## 2022-11-07 MED ORDER — ENOXAPARIN SODIUM 40 MG/0.4ML IJ SOSY
40.0000 mg | PREFILLED_SYRINGE | INTRAMUSCULAR | Status: DC
  Administered 2022-11-07 – 2022-11-13 (×7): 40 mg via SUBCUTANEOUS
  Filled 2022-11-07 (×6): qty 0.4

## 2022-11-07 MED ORDER — CYANOCOBALAMIN 1000 MCG/ML IJ SOLN
1000.0000 ug | Freq: Once | INTRAMUSCULAR | Status: AC
  Administered 2022-11-07: 1000 ug via INTRAMUSCULAR
  Filled 2022-11-07: qty 1

## 2022-11-07 MED ORDER — KCL IN DEXTROSE-NACL 20-5-0.9 MEQ/L-%-% IV SOLN
INTRAVENOUS | Status: DC
  Filled 2022-11-07 (×12): qty 1000

## 2022-11-07 MED ORDER — THIAMINE HCL 100 MG/ML IJ SOLN
500.0000 mg | INTRAVENOUS | Status: DC
  Administered 2022-11-08 – 2022-11-13 (×6): 500 mg via INTRAVENOUS
  Filled 2022-11-07 (×8): qty 5

## 2022-11-07 NOTE — Progress Notes (Signed)
Physical Therapy Treatment Patient Details Name: Jody Nolan MRN: [id number] DOB: 05/20/1965 Today's Date: 11/07/2022   History of Present Illness Pt is a 57 y/o female presenting on [facility] on 7/10 with worsening mental status. MRI brain showed lateral abnormal cortical diffusion and FLAIR signal with severe encephalopathy. EEG study revealed periodic discharges with triphasic morphology which now appears more predominantly in left hemisphere. This pattern is on the ictal-interictal continuum specifically the lateralized nature of the discharges which can suggest underlying cortical abnormality. No signifcant PMH.    PT Comments  Today's session focused on progressive functional mobility. Upon arrival to room pt mother at bedside and trying to calm down. Pt visibly nervous but able to calm with hand held and use of pt name. Pt with decreased initiation today and increased hesitation for mobilization. Mod tactile and verbal cueing to begin BLE navigation to EOB with pt completing once cued to start. Pt able to shift hips forward to reposition EOB when prompted. Mod A x2  person to achieve stand, amb of 76' with min G x2 HHA, hesitation for directional changes but benefits from constant cueing. Max HR recorded during amb of 153 BPM. Pt amb in bathroom with RN and PT for toileting, extreme resistance and fearful to progress amb back to bed once completed. 3 person assist required to facilitate ungrasp of grab bars and safe transition into recliner. Pt calms when returned to supine with max A. Pt will continue to benefit from skilled PT during their acute admission to promote OOB mobility and functional activities.    Assistance Recommended at Discharge Frequent or constant Supervision/Assistance  If plan is discharge home, recommend the following:  Can travel by private vehicle    A little help with walking and/or transfers;A little help with bathing/dressing/bathroom;Assistance with  cooking/housework;Assistance with feeding;Direct supervision/assist for medications management;Direct supervision/assist for financial management;Help with stairs or ramp for entrance;Assist for transportation      Equipment Recommendations  None recommended by PT    Recommendations for Other Services       Precautions / Restrictions Precautions Precautions: Fall;Other (comment) Precaution Comments: watch HR Restrictions Weight Bearing Restrictions: No     Mobility  Bed Mobility Overal bed mobility: Needs Assistance Bed Mobility: Supine to Sit, Sit to Supine     Supine to sit: Mod assist, HOB elevated Sit to supine: HOB elevated, Max assist   General bed mobility comments: pt requires increased assist for initation and sequencing, mod mulitmodal cueing and physical assist to bring BLEs most of the way to the edge of the bed before she initates.    Transfers Overall transfer level: Needs assistance Equipment used: 2 person hand held assist Transfers: Sit to/from Stand Sit to Stand: Mod assist           General transfer comment: mod A to initiate stand, requires HHA to attempt    Ambulation/Gait Ambulation/Gait assistance: Min guard Gait Distance (Feet): 80 Feet Assistive device: 2 person hand held assist Gait Pattern/deviations: Step-to pattern, Decreased stride length, Narrow base of support, Decreased step length - left, Decreased step length - right Gait velocity: reduced Gait velocity interpretation: <1.8 ft/sec, indicate of risk for recurrent falls   General Gait Details: Short steps with no step-through, hesitant with direction change. Narrow BOS. Demos bilateral turning, side stepping, and backwards stepping.   Stairs             Wheelchair Mobility     Tilt Bed    Modified Rankin (Stroke Patients Only)  Balance Overall balance assessment: Needs assistance Sitting-balance support: Feet supported, No upper extremity  supported Sitting balance-Leahy Scale: Fair     Standing balance support: Bilateral upper extremity supported, During functional activity Standing balance-Leahy Scale: Poor Standing balance comment: relies on UE and external support                            Cognition Arousal/Alertness: Awake/alert Behavior During Therapy: Flat affect Overall Cognitive Status: Difficult to assess Area of Impairment: Following commands, Problem solving, Awareness, Attention                   Current Attention Level: Focused   Following Commands: Follows one step commands inconsistently, Follows one step commands with increased time   Awareness: Intellectual Problem Solving: Decreased initiation, Slow processing, Difficulty sequencing, Requires verbal cues, Requires tactile cues General Comments: decreased initiation, benefits greatly from verbal cues using pt name and close proximity. tactile cues helpful for initation of mobility with hand over hand guiding. Pt with extreme resistance at end of session to return to bed from the toilet, requiring 3 person assist to seat in recliner and return to bed safely        Exercises      General Comments General comments (skin integrity, edema, etc.): Pt with heavy resistance to return from bathroom to bed concluding session, requiring 3 person assist to safely move into the recliner. Pt grabbing for things to pull on and difficulty to release grip. Calms down when returned to supine. Hr peak during ambulation of 153 BPM      Pertinent Vitals/Pain Pain Assessment Pain Assessment: Faces Faces Pain Scale: No hurt Pain Intervention(s): Monitored during session    Home Living                          Prior Function            PT Goals (current goals can now be found in the care plan section) Acute Rehab PT Goals Patient Stated Goal: pt unable to participate in goal setitng at this time PT Goal Formulation: With  patient Time For Goal Achievement: 11/19/22 Potential to Achieve Goals: Fair Progress towards PT goals: Progressing toward goals    Frequency    Min 3X/week      PT Plan Current plan remains appropriate    Co-evaluation              AM-PAC PT "6 Clicks" Mobility   Outcome Measure  Help needed turning from your back to your side while in a flat bed without using bedrails?: A Little Help needed moving from lying on your back to sitting on the side of a flat bed without using bedrails?: A Lot Help needed moving to and from a bed to a chair (including a wheelchair)?: A Lot Help needed standing up from a chair using your arms (e.g., wheelchair or bedside chair)?: A Lot Help needed to walk in hospital room?: A Little Help needed climbing 3-5 steps with a railing? : A Lot 6 Click Score: 14    End of Session Equipment Utilized During Treatment: Gait belt Activity Tolerance: Patient tolerated treatment well Patient left: in bed;with call bell/phone within reach;with bed alarm set;with family/visitor present Nurse Communication: Mobility status PT Visit Diagnosis: Other symptoms and signs involving the nervous system (R29.898);Other abnormalities of gait and mobility (R26.89);Unsteadiness on feet (R26.81);Difficulty in walking, not elsewhere classified (R26.2)  Time: 1610-9604 PT Time Calculation (min) (ACUTE ONLY): 30 min  Charges:    $Therapeutic Activity: 23-37 mins PT General Charges $$ ACUTE PT VISIT: 1 Visit                     Hendricks Milo, SPT  Acute Rehabilitation Services    Hendricks Milo 11/07/2022, 5:11 PM

## 2022-11-07 NOTE — Progress Notes (Signed)
Neurology Progress Note  Brief HPI: 57 year old female with relatively abrupt onset 3 weeks ago of progressive and precipitous cognitive decline, with first symptom being short term memory impairment, forgetting key details of her daughter's stated plans for a trip multiple times, manifested as forgetting over time frames of a few hours despite repeatedly being informed/corrected regarding the information she was not retaining. A few days later, her behavior started to become altered and somewhat inappropriate. She had complained of a recent tick bite without an associate rash and was prescribed with Doxycycline for a presumed tick-borne illness at [facility] at some point. Symptoms progressed to severe confusion requiring admission to the hospital in [facility], near her residence there. She was determined to be safe for discharge after some testing that did not yield a diagnosis. Family then drove her up to  to be watched closely and cared for by them, as she seemed to be precipitously losing her ability to manage her own affairs. After further decline, they brought her to [facility] to be evaluated. Work up there included LP as well as MRI brain, which revealed multifocal cortical ribboning on DWI and T2, as well as abnormal basal ganglia signal changes, but no abnormal enhancement.   Again, symptoms of progressively worsening confusion and altered mental status are thought to have been going on for about 3 weeks. Possibilities include autoimmune or infectious encephalitis, though a bacterial or one of the more common viral infectious etiologies are less likely given no fever and negative meningitis/encephalitis CSF PCR panel. Exam reveals a severely abulic patient without myoclonus or tremor. Tone is normal, with no waxy rigidity noted. CSF labs negative for infection so far. There is mild nonspecific elevation of CSF WBCs. CSF Protein and glucose are normal. EEG with triphasic waves.    Subjective: Patient seen in room with her husband at the bedside.  She reportedly slept well last night.  She is not speaking today but will sometimes nod in response to questions.  She will intermittently follow commands as well.  Autoimmune encephalitis panel has returned and is negative, pathology smear of CSF is unrevealing.  Patient's husband voices concern for patient's ability to swallow regular foods, will order SLP eval with swallow study.  Beta Isoform, RT-QuIC and tau protein have been ordered as a send-out panel to the [facility] at [facility] from the remaining CSF at [facility] to assess for possible CJD. Spoke with lab at [facility] and it is being sent. Pathology and Infection Prevention notified as well.  Exam: Vitals:   11/06/22 2317 11/07/22 0759  BP: (!) 122/91 (!) 150/90  Pulse: 100 (!) 119  Resp: 16 20  Temp: 98.6 F (37 C) 99.9 F (37.7 C)  SpO2: 96% 97%   Gen: In bed, NAD Resp: non-labored breathing, no acute distress Abd: soft, nt  Neuro: Mental Status: Awake with eyes open but minimally interactive Cranial Nerves:  II: PERRL. Visual fields full to confrontation.  III, IV, VI: EOM intact. Eyelids elevate symmetrically.  VII: no facial asymmetry   VIII: hearing intact to voice Motor:  RUE: 4/5         LUE: 4/5 RLE 4/5           LLE: 4/5  Tone is normal and bulk is normal No myoclonus or tremor noted.  Sensation- Intact to light touch bilaterally Coordination: Unable to perform Gait- Deferred     Pertinent Labs: Electrolytes normal CBC normal RPR negative UDS negative EtOH  and salicylate: Negative Glucose 98 CRP - 0.5 Sed rate- 10 CSF labs resulted:             - Clear and colorless - Meningitis/encephalitis panel- negative - Protein 41 - Glucose 70 - CSF WBC- mildly elevated at 17 - Cryptococcal antigen- negative - Autoimmune encephalitis Ab negative -Pathologist smear review of CSF was negative for malignancy with  monocytes, lymphocytes and rare neutrophils CSF labs pending:  VDRL  IgG index Oligoclonal bands [place] IgG [place] IgM Ehrlichia Arbovirus IgG and IgM Paraneoplastic Ab  TSH- 2.862 T4, free- 1.04 Thyroglobulin antibody negative Thyroid peroxidase 26 Vitamin B1 pending Vitamin B12 normal ANA w/ reflex pending ANCA pending Heavy metals pending Lime disease serology negative HIV negative Cryptococcal antigen negative Beta isoform pending RT-quic pending Tile protein pending  Imaging Reviewed: CTA head and neck  1. Patent vasculature of the head and neck without hemodynamically significant or occlusion. No evidence of vasospasm or RCVS in the intracranial vasculature. 2. 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  3. 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   MRI brain Widespread, bilateral abnormal cortical diffusion with ribboning and correlating FLAIR signal in a nonspecific pattern. There is also asymmetric caudate nucleus involvement also on the left. But white matter seems spared, there is no associated abnormal enhancement, and no intracranial mass effect. Broad differential diagnosis at this point and recommend Neurology consultation with consideration of: Infectious Meningoencephalitis (although lack of enhancement argues against), Reversible cerebral vasoconstriction (RCVS), Urea cycle disorders/Uremia, hypoglycemia, Seizure disorder, PRES (although sparing of white matter argues against), Hepatic encephalopathy, Mitochondrial disorder, and Creutzfeldt-jakob disease.   LTM EEG 11/04/2022 0202 to 11/04/2022 0915  ABNORMALITY - Periodic discharges with triphasic morphology, generalized ( GPDs) - Continuous slow, generalized  IMPRESSION: This study showed generalized periodic discharges with triphasic morphology which can  be on the ictal-interictal continuum. However, the morphology, frequency and reactivity to stimulation is more commonly suggestive of toxic-metabolic causes. Additionally there is moderate diffuse encephalopathy. No seizures were seen throughout the recording.  LTM EEG 11/05/2022 0202 to 11/05/2022 0956  ABNORMALITY - Periodic discharges with triphasic morphology, generalized  and lateralized left hemisphere - Continuous slow, generalized  IMPRESSION: This study showed periodic discharges with triphasic morphology which now appears more predominantly in left hemisphere. This pattern is on the ictal-interictal continuum specifically the lateralized nature of the discharges which can suggest underlying cortical abnormality. Creutzfeldt-Jakob disease (CJD) can have similar EEG appearance although EEG is not diagnostic. Additionally there is moderate diffuse encephalopathy. No definite seizures were seen throughout the recording.  LTM EEG 715 Periodic discharges with triphasic morphology, generalized and lateralized left hemisphere  Assessment: 57 year old female with relatively abrupt onset severe confusion with cortical ribboning on MRI. Symptoms of rapidly and progressively worsening confusion and altered mental status are endorsed by family as having been going on for about 3 weeks. Possibilities include encephalitis, autoimmune or infectious, though a bacterial or viral infectious etiology is less likely given no fever and negative meningitis/encephalitis CSF PCR panel.  - Exam reveals a severely abulic patient without myoclonus or tremor. Tone is normal, with no waxy rigidity noted.  - CSF labs do not militate in favor of infection. There is mild nonspecific elevation of CSF WBCs. Protein and glucose are normal.  - EEG shows no electrographic seizures to suggest an epileptic etiology for her brain lesions. 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 Per the literature, typical EEG  findings in CJD include bi-anterior dominant periodic triphasic sharp wave complexes, lasting 600-1039ms, occuring at a rate of 0.5 to 2Hz .  - MRI findings as documented above include multifocal cortical ribboning and anterior basal ganglia diffuse hyperintensity.   - Acute thiamine deficiency is unlikely, but will treat with empiric thiamine -Empiric steroids started 7/15 after discussion with ID - DDx:  - CJD  - Paraneoplastic syndrome - Autoimmune encephalitis such as a rare imaging presentation of ADEM, would be worthwhile to start high-dose steroids if infectious disease service has no objection to this - Arbovirus or other CNS-trophic virus not detectable on CSF PCR panel is also possible, and is felt to be an intermediate possibility based on literature review of cases including MRI imaging.  - The imaging findings are not typical for Lyme disease, which tends more to affect the white matter.  - Imaging also not very consistent with RMSF, which tends to show multiple much smaller lesions in the white matter (aka "starry sky" pattern), rather than the grey matter involvement seen on MRI of our patient.      Recommendations: - Continue high-dose empiric thiamine - Continue Keppra 1 g twice daily after 2 g load - RT-QuIC and 14-3-3 ordered along with tau as a send out to the [facility] at [facility]. The approximate current sensitivity of CSF RT-QuIC is 92% and the specificity is 100%.  - No specific isolation precautions for suspected CJD specific to [place] can be found on literature search. Per [facility] guidelines, there is no evidence that normal social or routine contact with a CJD patient represents a risk for healthcare workers, household members, or others. Isolation of patients with CJD is not necessary for routine examinations, etc. However, IF there are any interventions, equipment has to be sequestered (or all disposable) until a diagnosis  is found. .  - CT of chest/abdomen/pelvis with contrast to assess for possible malignancy  - Has been seen by ID at [facility], but has not yet been seen by ID team at [facility]. Obtaining ID consult here. Discussed with Dr. Drue Second via SecureChat.  - started empiric IV steroids after clearance from Hospitalist team. Hospitalist team also discussed with ID and obtianed their clearance per my discussion with them. - Paraneoplastic panel pending     Patient seen and examined by NP/APP and then by MD, MD to edit note is needed Cortney E Ernestina Columbia , MSN, AGACNP-BC [facility] See Amion for schedule and pager information 11/07/2022 8:32 AM   NEUROHOSPITALIST ADDENDUM Performed a face to face diagnostic evaluation.   I have reviewed the contents of history and physical exam as documented by PA/ARNP/Resident and agree with above documentation.  I have discussed and formulated the above plan as documented. Edits to the note have been made as needed.  Impression/Key exam findings/Plan: still waiting on multiple labs to return. CSF and serum Autoimmune encephalopathy panel is negative. Will continue with IV solumedrol in the meantime in case this is a potential autoimmune process.  Plan discussed with husband and mother at bedside.  Erick Blinks, MD [facility] [phone]   If 7pm to 7am, please call on call as listed on AMION.

## 2022-11-07 NOTE — Evaluation (Signed)
Clinical/Bedside Swallow Evaluation Patient Details  Name: Jody Nolan MRN: [id number] Date of Birth: 06-May-1965  Today's Date: 11/07/2022 Time: SLP Start Time (ACUTE ONLY): 1100 SLP Stop Time (ACUTE ONLY): 1115 SLP Time Calculation (min) (ACUTE ONLY): 15 min  Past Medical History:  Past Medical History:  Diagnosis Date   Allergic rhinitis    Degeneration of cervical intervertebral disc    DUB (dysfunctional uterine bleeding)    Family history of adverse reaction to anesthesia    Mothr - PONV   Family history of colon cancer    History of abnormal cervical Pap smear    History of chicken pox    Radiculopathy    Reaction, stress, acute    Shoulder pain    Past Surgical History:  Past Surgical History:  Procedure Laterality Date   ABDOMINAL HYSTERECTOMY     partial   CESAREAN SECTION  2000   COLONOSCOPY WITH PROPOFOL N/A 03/29/2018   Procedure: COLONOSCOPY WITH BIOPSY;  Surgeon: Midge Minium, MD;  Location: [facility];  Service: Endoscopy;  Laterality: N/A;   IR LUMBAR PUNCTURE  11/03/2022   LEEP  1990's   POLYPECTOMY N/A 03/29/2018   Procedure: POLYPECTOMY;  Surgeon: Midge Minium, MD;  Location: [facility];  Service: Endoscopy;  Laterality: N/A;   HPI:  Pt is a 57 yo female presenting to [facility] 7/10 with several weeks of worsening AMS; transferred to [facility] 7/12. MRI of brain showed widespread, bilateral abnormal cortical diffusion and FLAIR signal in a nonspecific pattern. 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 SLP bedside swallow evaluation ordered on 7/16 due to concern of patient holding PO's in mouth and having potential decline in swallowing abilities.    Assessment / Plan / Recommendation  Clinical Impression  Patient presents with clinical s/s of what appears to be a cognitive-based dysphagia as per this bedside swallow  evaluation. Spouse reported that she has been holding liquids and solids (even purees) in mouth without awareness and he fears she might choke on oral residuals. When SLP arrived into room, patient awake and alert, smiling but overall affect was impaired. She was able to hold cup of juice but required tactile and some hand over hand assist cues to initaite sips from straw. She was able to draw liquid through straw and into mouth without observed delay, however she would then hold liquids in oral cavity with average of 3-5 seconds before she initiated a swallow. She did the same with spoon bites of applesauce. Patient became less interactive as session progressed, and when SLP offering her one more sip of liquids, she held cup tightly and would not move her arm; she resisted when straw presented to lips. Spouse and mother expressed concerns of her ability get enough PO intake. SLP did start discussion of possible need for Cortrak feeding tube. SLP is recommending downgrade to puree solids. Spouse would like to hold off on Cortrak until we are able to see how she does with puree solids. SLP will continue to follow. SLP Visit Diagnosis: Dysphagia, unspecified (R13.10)    Aspiration Risk  Mild aspiration risk    Diet Recommendation Thin liquid;Dysphagia 1 (Puree)    Liquid Administration via: Cup;Straw Supervision: Full supervision/cueing for compensatory strategies;Staff to assist with self feeding Compensations: Slow rate;Small sips/bites Postural Changes: Seated upright at 90 degrees    Other  Recommendations Oral Care Recommendations: Oral care BID    Recommendations for follow up therapy  are one component of a multi-disciplinary discharge planning process, led by the attending physician.  Recommendations may be updated based on patient status, additional functional criteria and insurance authorization.  Follow up Recommendations Outpatient SLP      Assistance Recommended at Discharge     Functional Status Assessment Patient has had a recent decline in their functional status and demonstrates the ability to make significant improvements in function in a reasonable and predictable amount of time.  Frequency and Duration min 2x/week  2 weeks       Prognosis Prognosis for improved oropharyngeal function: Fair Barriers to Reach Goals: Severity of deficits;Behavior      Swallow Study   General Date of Onset: 11/07/22 HPI: Pt is a 57 yo female presenting to [facility] 7/10 with several weeks of worsening AMS; transferred to [facility] 7/12. MRI of brain showed widespread, bilateral abnormal cortical diffusion and FLAIR signal in a nonspecific pattern. 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 SLP bedside swallow evaluation ordered on 7/16 due to concern of patient holding PO's in mouth and having potential decline in swallowing abilities. Type of Study: Bedside Swallow Evaluation Previous Swallow Assessment: none found Diet Prior to this Study: Regular;Thin liquids (Level 0) Temperature Spikes Noted: No Respiratory Status: Room air History of Recent Intubation: No Behavior/Cognition: Alert;Cooperative;Confused;Requires cueing Oral Cavity Assessment: Other (comment) (unable to visualize entirely but appeared [facility]) Oral Care Completed by SLP: No Oral Cavity - Dentition: Adequate natural dentition Self-Feeding Abilities: Needs set up;Needs assist Patient Positioning: Upright in bed Baseline Vocal Quality: Not observed Volitional Cough: Cognitively unable to elicit Volitional Swallow: Unable to elicit    Oral/Motor/Sensory Function Overall Oral Motor/Sensory Function: Within functional limits   Ice Chips     Thin Liquid Thin Liquid: Impaired Presentation: Straw Oral Phase Functional Implications: Oral holding Other Comments: holds liquids in oral cavity for up  to 5 seconds before swallowing    Nectar Thick     Honey Thick     Puree Puree: Impaired Oral Phase Functional Implications: Oral holding Other Comments: holds puree in oral cavity for up to 5 seconds before swallowing   Solid     Solid: Not tested     Angela Nevin, MA, CCC-SLP Speech Therapy

## 2022-11-07 NOTE — Plan of Care (Signed)

## 2022-11-07 NOTE — Progress Notes (Signed)
Occupational Therapy Treatment Patient Details Name: Jody Nolan MRN: [id number] DOB: February 29, 1966 Today's Date: 11/07/2022   History of present illness Pt is a 57 y/o female presenting on [facility] on 7/10 with worsening mental status. MRI brain showed lateral abnormal cortical diffusion and FLAIR signal with severe encephalopathy. EEG study revealed periodic discharges with triphasic morphology which now appears more predominantly in left hemisphere. This pattern is on the ictal-interictal continuum specifically the lateralized nature of the discharges which can suggest underlying cortical abnormality. No signifcant PMH.   OT comments  Pt upright in bed upon entry, Mom at side.  Patient without verbalizations today, but smiling intermittently initially.  She requires increased cueing today for task initiation and sequencing, following less simple commands.  She requires mod assist for bed mobility, mod assist for transfers and with mobility to bathroom requires RW and assist to weight shift to progress ambulation.  Attempted familiar task at sink (brushing teeth), but pt continues to require initiation and sequencing assist; able to carry over briefly but then starts shaking and becoming agitated.  Pt reassured, and assisted back to bed. Did not athetosis type movements of Ues today upon entry, and difficulty holding squeeze ball in R hand.  Pt will need 24/7 support at dc, if family feels they cannot provide level of assist we may need to look into rehab options. Will follow acutely.    Recommendations for follow up therapy are one component of a multi-disciplinary discharge planning process, led by the attending physician.  Recommendations may be updated based on patient status, additional functional criteria and insurance authorization.    Assistance Recommended at Discharge Frequent or constant Supervision/Assistance  Patient can return home with the following  A lot of help with  bathing/dressing/bathroom;A little help with walking and/or transfers;Assistance with cooking/housework;Assistance with feeding;Direct supervision/assist for medications management;Direct supervision/assist for financial management;Assist for transportation;Help with stairs or ramp for entrance;A lot of help with walking and/or transfers   Equipment Recommendations  BSC/3in1    Recommendations for Other Services      Precautions / Restrictions Precautions Precautions: Fall;Other (comment) Precaution Comments: watch HR Restrictions Weight Bearing Restrictions: No       Mobility Bed Mobility Overal bed mobility: Needs Assistance Bed Mobility: Supine to Sit, Sit to Supine     Supine to sit: Mod assist, HOB elevated Sit to supine: Mod assist, HOB elevated   General bed mobility comments: pt requires increased assist for initation and sequencing, max mulitmodal cueing and physical asssist to bring BLEs most of the way to the edge of the bed before she initates.  When returning back to bed, she requires assist for BLEs back to supine.    Transfers Overall transfer level: Needs assistance Equipment used: None Transfers: Sit to/from Stand Sit to Stand: Mod assist           General transfer comment: to fully power up and stand, min assist required to initate power up- pt very unsure in standing and appeared to like support of RW     Balance Overall balance assessment: Needs assistance Sitting-balance support: Feet supported, No upper extremity supported Sitting balance-Leahy Scale: Fair     Standing balance support: Bilateral upper extremity supported, During functional activity Standing balance-Leahy Scale: Poor Standing balance comment: relies on UE and external support                           ADL either performed or assessed with clinical judgement  ADL Overall ADL's : Needs assistance/impaired     Grooming: Maximal assistance;Standing;Oral  care Grooming Details (indicate cue type and reason): attempted oral care at sink, able to stand with min guard but max assist initate task, place toothbrush in mouth and initate brushing.  Pt able to carryover briefly and then pulled brush out of mouth and started shaking.  Appeared agitated and assisted pt back to bed.                 Toilet Transfer: Moderate assistance;Ambulation;Rolling walker (2 wheels) Toilet Transfer Details (indicate cue type and reason): used RW         Functional mobility during ADLs: Moderate assistance;Rolling walker (2 wheels);Cueing for sequencing;Cueing for safety      Extremity/Trunk Assessment Upper Extremity Assessment Upper Extremity Assessment:  (athetoid type movements of UEs, rigitidty at times. poor coordination)            Vision       Perception     Praxis      Cognition Arousal/Alertness: Awake/alert Behavior During Therapy: Flat affect Overall Cognitive Status: Difficult to assess Area of Impairment: Following commands, Problem solving, Awareness, Attention                   Current Attention Level: Focused   Following Commands: Follows one step commands inconsistently, Follows one step commands with increased time   Awareness: Intellectual Problem Solving: Decreased initiation, Slow processing, Difficulty sequencing, Requires verbal cues, Requires tactile cues General Comments: pt demonstrating decreased initation and sequencing today, requires mulitmodal cueing and increased time for functional tasks. decreased ability to follow commands noted.no verbalizations.        Exercises      Shoulder Instructions       General Comments mom at side and suppportive.  During functional mobility to bathroom, pt requires weightshifting support to progress mobility but on the way back only requires guidance support of RW until mobility required her to turn.    Pertinent Vitals/ Pain       Pain Assessment Pain  Assessment: Faces Faces Pain Scale: No hurt Pain Intervention(s): Monitored during session  Home Living                                          Prior Functioning/Environment              Frequency  Min 2X/week        Progress Toward Goals  OT Goals(current goals can now be found in the care plan section)  Progress towards OT goals: Not progressing toward goals - comment;OT to reassess next treatment  Acute Rehab OT Goals Patient Stated Goal: unable OT Goal Formulation: Patient unable to participate in goal setting Time For Goal Achievement: 11/19/22 Potential to Achieve Goals: Fair  Plan Discharge plan remains appropriate;Frequency remains appropriate    Co-evaluation                 AM-PAC OT "6 Clicks" Daily Activity     Outcome Measure   Help from another person eating meals?: A Lot Help from another person taking care of personal grooming?: A Lot Help from another person toileting, which includes using toliet, bedpan, or urinal?: A Lot Help from another person bathing (including washing, rinsing, drying)?: A Lot Help from another person to put on and taking off regular upper body clothing?: A Lot Help  from another person to put on and taking off regular lower body clothing?: A Lot 6 Click Score: 12    End of Session Equipment Utilized During Treatment: Gait belt;Rolling walker (2 wheels)  OT Visit Diagnosis: Other abnormalities of gait and mobility (R26.89);Muscle weakness (generalized) (M62.81);Other symptoms and signs involving cognitive function   Activity Tolerance Patient tolerated treatment well   Patient Left in bed;with call bell/phone within reach;with bed alarm set;with family/visitor present   Nurse Communication Mobility status        Time: 5621-3086 OT Time Calculation (min): 30 min  Charges: OT General Charges $OT Visit: 1 Visit OT Treatments $Self Care/Home Management : 23-37 mins  Barry Brunner, OT Acute  Rehabilitation Services Office [phone]   Chancy Milroy 11/07/2022, 1:48 PM

## 2022-11-07 NOTE — Progress Notes (Signed)
[facility] Progress Note Patient: Jody Nolan BJY:[id number] DOB: 1965/11/16 DOA: 11/03/2022  DOS: the patient was seen and examined on 11/07/2022  Brief hospital course: PMH of depression, anxiety, DU B presented to the hospital with complaints of confusion. Admitted at [facility] 7/11.  Neurology was consulted as well as ID was consulted. Underwent further workup including an LP as well as MRI brain and EEG.  MRI brain showed by lateral abnormal cortical diffusion and FLAIR signal and EEG showed triphasic waves as well as severe encephalopathy.  Patient was started on IV Keppra and was sent to [facility] for long-term EEG monitoring. Now receiving IV steroids. 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 Etiology still not clear. Concern for seizures. Appreciate neurology follow-up. Workup including RPR, HIV, TSH, B12 unremarkable. B1 244. Thyroid antibodies unremarkable. ANA negative. Autoimmune encephalitis panel negative heavy metal screen pending.  Lyme serology negative. Started on IV doxycycline earlier due to concerns for tickborne illness.  ID following, now on no antibiotics. Started on IV Keppra. Had LTM EEG per neurology. Started on IV steroids on 7/15.  Hypokalemia. Replaced.  History of anxiety and depression. Not on any medication. Currently undergoing significant stress in her life unsure if that is contributing to her acute worsening. Once organic issues are treated, may require psychiatry consultation as well.  weight loss. Reported history.  Unclear about exact extent of weight loss. Could be related to mood disorder, also patient is on semaglutide weight management. Receiving thiamine therapy for possible encephalopathy related to that. Dietitian consulted.  History of tick bite. Reported history of tick bite. Concern for CNS encephalitis in the setting of tickborne illness. Currently on IV doxycycline. My  suspicion for that is less. ID has seen the patient at [facility].  ID following.  Will monitor.  Reported right-sided weakness. There is no examination on 7/14 patient does not appear to have any significant swelling edema or tenderness in the right upper extremity. Equal strength in bilateral examination as well.   History of anxiety. Almost catatonic appearance. Once organic etiologies are treated I suspect patient will also require psychiatry consultation.  Subjective: Unable to follow commands well today.  Not speaking.  Did not answer any of my question with noding as well.  No nausea no vomiting.  No other events overnight.  Physical Exam: Clear to auscultation. S1-S2 present Bowel sound present. Alert and awake, nonverbal.  Unable to follow commands for me. Withdraws to painful stimuli.  Right upper extremity was showing The Trousseau sign initially but did not persist on further examination (level normal)  Data Reviewed: I have Reviewed nursing notes, Vitals, and Lab results. Ordered CBC and CMP.  Repeated CBC CMP magnesium.  Disposition: Status is: Inpatient Remains inpatient appropriate because: Requiring IV steroids  enoxaparin (LOVENOX) injection 40 mg Start: 11/07/22 1400 Place and maintain sequential compression device Start: 11/05/22 1619   Family Communication: Family at bedside Level of care: Telemetry Medical   Vitals:   11/07/22 0759 11/07/22 0802 11/07/22 1039 11/07/22 1423  BP: (!) 150/90  128/82 114/78  Pulse: (!) 119 (!) 107 (!) 105 (!) 113  Resp: 20 19 17 17   Temp: 99.9 F (37.7 C)  98 F (36.7 C) 98.4 F (36.9 C)  TempSrc: Oral  Oral Oral  SpO2: 97% 95% 97% 90%     Author: Lynden Oxford, MD 11/07/2022 7:26 PM  Please look on [email or web address] to find out who is on call.

## 2022-11-07 NOTE — Progress Notes (Signed)
[facility]    Date of Admission:  11/03/2022   Total days of antibiotics 6           ID: Jody Nolan is a 57 y.o. female with  Principal Problem:   Acute encephalopathy Active Problems:   Anxiety and depression   Hypokalemia    Subjective: Febrile. Remains mostly mute. Appears to have slept well last night after receiving steroids. No significant improvement.  Family at bedside -   Medications:   bisacodyl  10 mg Rectal Once   enoxaparin (LOVENOX) injection  40 mg Subcutaneous Q24H   feeding supplement  237 mL Oral BID BM   levETIRAcetam  1,000 mg Oral BID   mouth rinse  15 mL Mouth Rinse 4 times per day   pantoprazole (PROTONIX) IV  40 mg Intravenous Q24H   sodium chloride flush  3 mL Intravenous Q12H    Objective: Vital signs in last 24 hours: Temp:  [98 F (36.7 C)-99.9 F (37.7 C)] 98 F (36.7 C) (07/16 1039) Pulse Rate:  [100-179] 105 (07/16 1039) Resp:  [12-20] 17 (07/16 1039) BP: (122-151)/(78-91) 128/82 (07/16 1039) SpO2:  [92 %-97 %] 97 % (07/16 1039) Physical Exam  Constitutional:   appears well-developed and well-nourished. No distress.  HENT: [place]/AT, PERRLA, no scleral icterus Mouth/Throat: Oropharynx is clear and moist. No oropharyngeal exudate.  Cardiovascular: Normal rate, regular rhythm and normal heart sounds. Exam reveals no gallop and no friction rub.  No murmur heard.  Pulmonary/Chest: Effort normal and breath sounds normal. No respiratory distress.  has no wheezes.  Neck = supple, no nuchal rigidity Abdominal: Soft. Bowel sounds are normal.  exhibits no distension. There is no tenderness.  Lymphadenopathy: no cervical adenopathy. No axillary adenopathy Neurological: answer 2 of 4 questions with appropriate nod.  Skin: Skin is warm and dry. No rash noted. No erythema.  Psychiatric: a normal mood and affect.  behavior is normal.   Lab Results Recent Labs    11/05/22 0423  WBC 6.3  HGB 13.5  HCT 40.9  NA  140  K 3.5  CL 107  CO2 25  BUN 8  CREATININE 0.69   Microbiology: -------------- Studies/Results: CT CHEST ABDOMEN PELVIS W CONTRAST  Result Date: 11/05/2022 CLINICAL DATA:  Metastatic disease evaluation. EXAM: CT CHEST, ABDOMEN, AND PELVIS WITH CONTRAST TECHNIQUE: Multidetector CT imaging of the chest, abdomen and pelvis was performed following the standard protocol during bolus administration of intravenous contrast. RADIATION DOSE REDUCTION: This exam was performed according to the departmental dose-optimization program which includes automated exposure control, adjustment of the mA and/or kV according to patient size and/or use of iterative reconstruction technique. CONTRAST:  75mL OMNIPAQUE IOHEXOL 350 MG/ML SOLN COMPARISON:  None Available. FINDINGS: CT CHEST FINDINGS Cardiovascular: No significant vascular findings. Normal heart size. No pericardial effusion. Mediastinum/Nodes: No enlarged mediastinal, hilar, or axillary lymph nodes. Thyroid gland, trachea, and esophagus demonstrate no significant findings. Lungs/Pleura: There is a calcified granuloma in the right lung apex. There is minimal atelectasis in both lung bases. The lungs are otherwise clear. There is no pleural effusion or pneumothorax. Musculoskeletal: No chest wall mass or suspicious bone lesions identified. CT ABDOMEN PELVIS FINDINGS Hepatobiliary: Gallstones are present. The gallbladder appears contracted. There is questionable wall thickening of the neck of the gallbladder. There is no biliary ductal dilatation. There is no surrounding inflammation. There is a single cyst in the left lobe of the liver measuring 1.9 cm. No other liver lesions are identified. Pancreas:  Unremarkable. No pancreatic ductal dilatation or surrounding inflammatory changes. Spleen: Normal in size without focal abnormality. Adrenals/Urinary Tract: Adrenal glands are unremarkable. Kidneys are normal, without renal calculi, focal lesion, or hydronephrosis.  Bladder is unremarkable. Stomach/Bowel: Stomach is within normal limits. Appendix appears normal. No evidence of bowel wall thickening, distention, or inflammatory changes. There is a large amount of stool distending the rectum. Vascular/Lymphatic: Aortic atherosclerosis. No enlarged abdominal or pelvic lymph nodes. Reproductive: Status post hysterectomy. No adnexal masses. Other: No abdominal wall hernia or abnormality. No abdominopelvic ascites. Musculoskeletal: No acute or significant osseous findings. IMPRESSION: 1. No evidence for metastatic disease in the chest, abdomen or pelvis. 2. Cholelithiasis with questionable wall thickening of the neck of the gallbladder. Recommend further evaluation with right upper quadrant ultrasound. 3. Large amount of stool distending the rectum. Aortic Atherosclerosis (ICD10-I70.0). Electronically Signed   By: Darliss Cheney M.D.   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 Auto immune panel is negative. She is going to do 5 day course of steroids to see if any difference. CJD labs likely to come back in 2-3wk. Continue with supportive care.  May need evaluation of nutritional intake if not eating 2-3 meals per day.  [facility] Pager: [phone]  11/07/2022, 1:22 PM

## 2022-11-08 ENCOUNTER — Inpatient Hospital Stay (HOSPITAL_COMMUNITY): Payer: BC Managed Care – PPO

## 2022-11-08 LAB — COMPREHENSIVE METABOLIC PANEL
ALT: 21 U/L (ref 0–44)
AST: 24 U/L (ref 15–41)
Albumin: 3.6 g/dL (ref 3.5–5.0)
Alkaline Phosphatase: 32 U/L — ABNORMAL LOW (ref 38–126)
Anion gap: 9 (ref 5–15)
BUN: 19 mg/dL (ref 6–20)
CO2: 24 mmol/L (ref 22–32)
Calcium: 9.2 mg/dL (ref 8.9–10.3)
Chloride: 106 mmol/L (ref 98–111)
Creatinine, Ser: 0.71 mg/dL (ref 0.44–1.00)
GFR, Estimated: >60 mL/min (ref >60)
Glucose, Bld: 171 mg/dL — ABNORMAL HIGH (ref 70–99)
Potassium: 4 mmol/L (ref 3.5–5.1)
Sodium: 139 mmol/L (ref 135–145)
Total Bilirubin: 0.6 mg/dL (ref 0.3–1.2)
Total Protein: 6.4 g/dL — ABNORMAL LOW (ref 6.5–8.1)

## 2022-11-08 LAB — CBC
HCT: 39.4 % (ref 36.0–46.0)
Hemoglobin: 13 g/dL (ref 12.0–15.0)
MCH: 29.8 pg (ref 26.0–34.0)
MCHC: 33 g/dL (ref 30.0–36.0)
MCV: 90.4 fL (ref 80.0–100.0)
Platelets: 300 10*3/uL (ref 150–400)
RBC: 4.36 MIL/uL (ref 3.87–5.11)
RDW: 12.9 % (ref 11.5–15.5)
WBC: 10.4 10*3/uL (ref 4.0–10.5)
nRBC: 0 % (ref 0.0–0.2)

## 2022-11-08 LAB — IGG CSF INDEX
Albumin CSF-mCnc: 43 mg/dL — ABNORMAL HIGH (ref 8–37)
Albumin: 4.1 g/dL (ref 3.8–4.9)
CSF IgG Index: 0.5 (ref 0.0–0.7)
IgG (Immunoglobin G), Serum: 673 mg/dL (ref 586–1602)
IgG, CSF: 3.6 mg/dL (ref 0.0–6.7)
IgG/Alb Ratio, CSF: 0.08 (ref 0.00–0.25)

## 2022-11-08 LAB — GLUCOSE, CAPILLARY
Glucose-Capillary: 174 mg/dL — ABNORMAL HIGH (ref 70–99)
Glucose-Capillary: 190 mg/dL — ABNORMAL HIGH (ref 70–99)
Glucose-Capillary: 193 mg/dL — ABNORMAL HIGH (ref 70–99)

## 2022-11-08 LAB — MAGNESIUM
Magnesium: 2.2 mg/dL (ref 1.7–2.4)
Magnesium: 2.3 mg/dL (ref 1.7–2.4)

## 2022-11-08 LAB — MISC LABCORP TEST (SEND OUT)
Labcorp test code: 138966
Labcorp test code: 138412

## 2022-11-08 LAB — VDRL, CSF: VDRL Quant, CSF: NONREACTIVE

## 2022-11-08 LAB — PHOSPHORUS: Phosphorus: 3.4 mg/dL (ref 2.5–4.6)

## 2022-11-08 MED ORDER — LORAZEPAM 2 MG/ML IJ SOLN: 2.0000 mg | Freq: Once | INTRAMUSCULAR | Status: DC

## 2022-11-08 MED ORDER — PROSOURCE TF20 ENFIT COMPATIBL EN LIQD
60.0000 mL | Freq: Every day | ENTERAL | Status: DC
  Administered 2022-11-08: 60 mL
  Filled 2022-11-08: qty 60

## 2022-11-08 MED ORDER — LORAZEPAM 2 MG/ML IJ SOLN
1.0000 mg | INTRAMUSCULAR | Status: DC | PRN
  Administered 2022-11-08: 1 mg via INTRAVENOUS
  Filled 2022-11-08: qty 1

## 2022-11-08 MED ORDER — BISACODYL 10 MG RE SUPP: 10.0000 mg | Freq: Every day | RECTAL | Status: DC | PRN

## 2022-11-08 MED ORDER — HALOPERIDOL LACTATE 5 MG/ML IJ SOLN
2.0000 mg | Freq: Four times a day (QID) | INTRAMUSCULAR | Status: DC | PRN
  Administered 2022-11-08: 5 mg via INTRAVENOUS
  Filled 2022-11-08: qty 1

## 2022-11-08 MED ORDER — LORAZEPAM 2 MG/ML IJ SOLN: 1.0000 mg | Freq: Once | INTRAMUSCULAR | Status: DC

## 2022-11-08 MED ORDER — LEVETIRACETAM IN NACL 1000 MG/100ML IV SOLN
1000.0000 mg | Freq: Two times a day (BID) | INTRAVENOUS | Status: DC
  Administered 2022-11-08 – 2022-11-16 (×17): 1000 mg via INTRAVENOUS
  Filled 2022-11-08 (×17): qty 100

## 2022-11-08 MED ORDER — GADOBUTROL 1 MMOL/ML IV SOLN
7.0000 mL | Freq: Once | INTRAVENOUS | Status: AC | PRN
  Administered 2022-11-08: 7 mL via INTRAVENOUS

## 2022-11-08 MED ORDER — OSMOLITE 1.5 CAL PO LIQD
1000.0000 mL | ORAL | Status: DC
  Administered 2022-11-08: 1000 mL

## 2022-11-08 NOTE — Procedures (Signed)
Cortrak  Person Inserting Tube:  Alroy Dust, Makayla L, RD Tube Type:  Cortrak - 43 inches Tube Size:  10 Tube Location:  Right nare Secured by: Bridle Technique Used to Measure Tube Placement:  Marking at nare/corner of mouth Cortrak Secured At:  75 cm   Cortrak Tube Team Note:  Consult received to place a Cortrak feeding tube.   X-ray is required, abdominal x-ray has been ordered by the Cortrak team. Please confirm tube placement before using the Cortrak tube.   If the tube becomes dislodged please keep the tube and contact the Cortrak team at [email or web address] for replacement.  If after hours and replacement cannot be delayed, place a NG tube and confirm placement with an abdominal x-ray.    Hermina Barters RD, LDN Clinical Dietitian See Shea Evans for contact information.

## 2022-11-08 NOTE — Progress Notes (Signed)
Neurology Progress Note  Brief HPI: 57 year old female with relatively abrupt onset 3 weeks ago of progressive and precipitous cognitive decline, with first symptom being short term memory impairment, forgetting key details of her daughter's stated plans for a trip multiple times, manifested as forgetting over time frames of a few hours despite repeatedly being informed/corrected regarding the information she was not retaining. A few days later, her behavior started to become altered and somewhat inappropriate. She had complained of a recent tick bite without an associate rash and was prescribed with Doxycycline for a presumed tick-borne illness at [facility] at some point. Symptoms progressed to severe confusion requiring admission to the hospital in [facility], near her residence there. She was determined to be safe for discharge after some testing that did not yield a diagnosis. Family then drove her up to [place] to be watched closely and cared for by them, as she seemed to be precipitously losing her ability to manage her own affairs. After further decline, they brought her to [facility] to be evaluated. Work up there included LP as well as MRI brain, which revealed multifocal cortical ribboning on DWI and T2, as well as abnormal basal ganglia signal changes, but no abnormal enhancement.   Again, symptoms of progressively worsening confusion and altered mental status are thought to have been going on for about 3 weeks. Possibilities include autoimmune or infectious encephalitis, though a bacterial or one of the more common viral infectious etiologies are less likely given no fever and negative meningitis/encephalitis CSF PCR panel. Exam reveals a severely abulic patient without myoclonus or tremor. Tone is normal, with no waxy rigidity noted. CSF labs negative for infection so far. There is mild nonspecific elevation of CSF WBCs. CSF Protein and glucose are normal. EEG with triphasic waves.    Subjective: Patient seen in room with her mother at the bedside.   She is Pensions consultant today. Mimics midline but does not follow any commands for me. Nods yes to all questions regardless of validity. (When asked if her sister was sitting next to her she nodded yes and nodded yes to mother being who is sitting next to her) SLP recommends puree solids with thin liquids after eval yesterday.    Beta Isoform, RT-QuIC and tau protein have been ordered as a send-out panel to the [facility] from the remaining CSF at [facility] to assess for possible CJD. Spoke with lab at [facility] and it is being sent. Pathology and Infection Prevention notified as well.  Exam: Vitals:   11/07/22 2310 11/08/22 0500  BP: (!) 142/95   Pulse: (!) 105 (!) 106  Resp: 17 15  Temp: 99.7 F (37.6 C)   SpO2: 94% 96%   Gen: In bed, NAD Resp: non-labored breathing, no acute distress Abd: soft, nt   Neuro: Mental Status: Awake with eyes open but minimally interactive. Tracks examiner Cranial Nerves:  II: PERRL. Visual fields full to confrontation.  III, IV, VI: EOM intact. Eyelids elevate symmetrically.  VII: no facial asymmetry   VIII: hearing intact to voice Motor:  RUE: 4/5         LUE: 4/5 RLE 4/5           LLE: 4/5  Tone increased in upper extremities and bulk is normal Rigidity noted with elevating extremities No myoclonus or tremor noted.  Reflexes: Babinski right- mute Babinski left- up going Sensation- Intact to light touch bilaterally Coordination: Unable to perform Gait- Deferred  Pertinent Labs: Electrolytes normal CBC normal RPR negative UDS negative EtOH and salicylate: Negative Glucose 98 CRP - 0.5  Sed rate- 10 TSH- 2.862 T4, free- 1.04 Thyroglobulin antibody negative Thyroid peroxidase 26 Vitamin B1 - 244 Vitamin B12 normal ANA w/ reflex pending ANCA - <0.2 Heavy metals - negative Lime disease serology negative HIV-  negative Cryptococcal antigen- negative  CSF labs resulted:             - Clear and colorless - Meningitis/encephalitis panel- negative - Protein 41 - Glucose 70 - CSF WBC- mildly elevated at 17 - Cryptococcal antigen- negative - Autoimmune encephalitis Ab negative -Pathologist smear review of CSF was negative for malignancy with monocytes, lymphocytes and rare neutrophils - [place] IgG- Negative - [place] IgM- Negative  CSF labs pending:  VDRL  IgG index Oligoclonal bands Ehrlichia Arbovirus IgG and IgM Paraneoplastic Ab  Autoimmune encephalitis anti NMDA receptor Beta isoform pending RT-quic pending Tile protein pending  Imaging Reviewed: CTA head and neck  1. Patent vasculature of the head and neck without hemodynamically significant or occlusion. No evidence of vasospasm or RCVS in the intracranial vasculature. 2. 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  3. 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   MRI brain Widespread, bilateral abnormal cortical diffusion with ribboning and correlating FLAIR signal in a nonspecific pattern. There is also asymmetric caudate nucleus involvement also on the left. But white matter seems spared, there is no associated abnormal enhancement, and no intracranial mass effect. Broad differential diagnosis at this point and recommend Neurology consultation with consideration of: Infectious Meningoencephalitis (although lack of enhancement argues against), Reversible cerebral vasoconstriction (RCVS), Urea cycle disorders/Uremia, hypoglycemia, Seizure disorder, PRES (although sparing of white matter argues against), Hepatic encephalopathy, Mitochondrial disorder, and Creutzfeldt-jakob disease.   LTM EEG 11/04/2022 0202 to 11/04/2022 0915  ABNORMALITY - Periodic discharges with triphasic morphology,  generalized ( GPDs) - Continuous slow, generalized  IMPRESSION: This study showed generalized periodic discharges with triphasic morphology which can be on the ictal-interictal continuum. However, the morphology, frequency and reactivity to stimulation is more commonly suggestive of toxic-metabolic causes. Additionally there is moderate diffuse encephalopathy. No seizures were seen throughout the recording.  LTM EEG 11/05/2022 0202 to 11/05/2022 0956  ABNORMALITY - Periodic discharges with triphasic morphology, generalized  and lateralized left hemisphere - Continuous slow, generalized  IMPRESSION: This study showed periodic discharges with triphasic morphology which now appears more predominantly in left hemisphere. This pattern is on the ictal-interictal continuum specifically the lateralized nature of the discharges which can suggest underlying cortical abnormality. Creutzfeldt-Jakob disease (CJD) can have similar EEG appearance although EEG is not diagnostic. Additionally there is moderate diffuse encephalopathy. No definite seizures were seen throughout the recording.  LTM EEG 715 Periodic discharges with triphasic morphology, generalized and lateralized left hemisphere  Assessment: 57 year old female with relatively abrupt onset severe confusion with cortical ribboning on MRI. Symptoms of rapidly and progressively worsening confusion and altered mental status are endorsed by family as having been going on for about 3 weeks. Possibilities include encephalitis, autoimmune or infectious, though a bacterial or viral infectious etiology is less likely given no fever and negative meningitis/encephalitis CSF PCR panel.  - Exam reveals a severely abulic patient without myoclonus or tremor. Tone is increased. Rigidity noted in upper and lower extremities - CSF labs do not militate in favor of infection. There is mild nonspecific  elevation of CSF WBCs. Protein and glucose are normal.  - EEG shows no  electrographic seizures to suggest an epileptic etiology for her brain lesions. However, the triphasic waves are suggestive of possible CJD. Per the literature, typical EEG findings in CJD include bi-anterior dominant periodic triphasic sharp wave complexes, lasting 600-1044ms, occuring at a rate of 0.5 to 2Hz .  - MRI findings as documented above include multifocal cortical ribboning and anterior basal ganglia diffuse hyperintensity.   - Acute thiamine deficiency is unlikely, but will treat with empiric thiamine -Empiric steroids started 7/15 after discussion with ID - DDx:  - CJD  - Paraneoplastic syndrome - Autoimmune encephalitis such as a rare imaging presentation of ADEM, would be worthwhile to start high-dose steroids if infectious disease service has no objection to this - Arbovirus or other CNS-trophic virus not detectable on CSF PCR panel is also possible, and is felt to be an intermediate possibility based on literature review of cases including MRI imaging.  - The imaging findings are not typical for Lyme disease, which tends more to affect the white matter.  - Imaging also not very consistent with RMSF, which tends to show multiple much smaller lesions in the white matter (aka "starry sky" pattern), rather than the grey matter involvement seen on MRI of our patient.      Recommendations: - Continue high-dose empiric thiamine - Continue Keppra 1 g twice daily after 2 g load - RT-QuIC and 14-3-3 ordered along with tau as a send out to the [facility] at [facility]. The approximate current sensitivity of CSF RT-QuIC is 92% and the specificity is 100%.  - No specific isolation precautions for suspected CJD specific to [place] can be found on literature search. Per [facility] guidelines, there is no evidence that normal social or routine contact with a CJD patient represents a risk for healthcare workers, household members, or others. Isolation of  patients with CJD is not necessary for routine examinations, etc. However, IF there are any interventions, equipment has to be sequestered (or all disposable) until a diagnosis is found. .  - CT of chest/abdomen/pelvis with contrast to assess for possible malignancy  - Has been seen by ID at [facility], but has not yet been seen by ID team at [facility]. Obtaining ID consult here. Discussed with Dr. Drue Second via SecureChat.  - started empiric IV steroids after clearance from Hospitalist team. Hospitalist team also discussed with ID and obtianed their clearance per my discussion with them. - Paraneoplastic panel pending    Patient seen and examined by NP/APP with MD. MD to update note as needed.   Elmer Picker, DNP, FNP-BC [facility] Pager: [phone]  NEUROHOSPITALIST ADDENDUM Performed a face to face diagnostic evaluation.   I have reviewed the contents of history and physical exam as documented by PA/ARNP/Resident and agree with above documentation.  I have discussed and formulated the above plan as documented. Edits to the note have been made as needed.  Impression/Key exam findings/Plan: no significant improvement despite steroids. Seems more withdrawn and somnolent today. Will continue steroids but will plan to get repeat MRI brain w + w/o C and LTM EEG today. Wet Nile Ab negative in serum. Still waiting on prior disease testing to come back.  Erick Blinks, MD [facility] [phone]   If 7pm to 7am, please call on call as listed on AMION.

## 2022-11-08 NOTE — Progress Notes (Signed)
Progress Note   Patient: Jody Nolan ZOX:[id number] DOB: 1965-12-06 DOA: 11/03/2022     5 DOS: the patient was seen and examined on 11/08/2022    Brief hospital course: 57yo with PMH of depression, anxiety, DUB presented on 7/10 to [facility] with complaints of confusion.  Neurology and ID were consulted. Underwent further workup including LP, MRI brain, and EEG.  MRI brain showed lateral abnormal cortical diffusion and FLAIR signal and EEG showed triphasic waves as well as severe encephalopathy.  Patient was started on IV Keppra and was sent to [facility] for long-term EEG monitoring.  Now receiving IV steroids.   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 Repeating LTM EEG and MRI now. Appreciate neurology follow-up. Workup including RPR, HIV, TSH, B12 unremarkable. B1 mildly elevated. Thyroid antibodies unremarkable. ANA negative Autoimmune encephalitis panel negative heavy metal screen negative.   Lyme serology negative. Started on IV doxycycline but now off.  ID following. Started on IV Keppra. Started on IV steroids on 7/15.  History of anxiety and depression Not on any medication. Currently undergoing significant stress in her life unsure if that is contributing to her acute worsening. Once organic issues are treated, may require psychiatry consultation as well.  Unintentional weight loss Reported history.  Unclear about exact extent of weight loss. Could be related to mood disorder. Monitor for now. Receiving thiamine therapy for possible encephalopathy related to that.  History of tick bite Reported history of tick bite. Concern for CNS encephalitis  in the setting of tickborne illness. ID following.   Likely unrelated. No longer on Doxy.  Disposition Increasing UE tone No attempts at engagement on exam Overall, appears to have a very poor prognosis Family seems to be aware and understand  They are receptive to palliative care consultation - ordered on 7/17 With inattention and minimal ability to follow commands, would change to NPO status and start Cortrak feeds - family is in agreement She does not appear to be currently a candidate for CIR - but is also not nearing discharge at this time  Assessment and Plan: No notes have been filed under this hospital service. Service: Hospitalist    Subjective: I spoke with her mother at bedside at length.  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  She moved to [facility] and is teaching there while her husband continues to work in [place].  They have an adult daughter.  She was apparently function in late June and drove home to [place].  This is thought to be acute starting 2-3 weeks ago.  Her UE weakness and hyperspasticity appears to be worse today.  She was emotionally labile this AM - jumped up smiling to hug an employee (mother says she resembles her daughter) and then bursting into tears after.  She was somnolent/not interactive at the time of my evaluation.  She was attempting to eat (mother fed 2 bites) - but those bites remained in her mouth without an effort to swallow and had to be manually removed by the nurse.  I spoke with her husband, who agrees to [employer]  They have an awesome long-term care insurance and terminal rider on her insurance. He is still  working, no way they can take care of her at home like this.  He is open to palliative care and receptive to any and all help.    Physical Exam: Vitals:   11/07/22 2310 11/08/22 0500 11/08/22 0818 11/08/22 1114  BP: (!) 142/95  124/87 138/80  Pulse: (!) 105 (!) 106 (!) 111 (!) 118  Resp: 17  15 20  (!) 22  Temp: 99.7 F (37.6 C)  98.9 F (37.2 C) 98 F (36.7 C)  TempSrc: Axillary  Axillary Oral  SpO2: 94% 96% 97% 93%   General:  Awake with eyes closed, not following commands.  All of breakfast remained in her mouth and had to be manually removed. Eyes:   normal lids, eyes closed tightly throughout ENT:  grossly normal lips & tongue, mmm; appropriate dentition Neck:  no LAD, masses or thyromegaly Cardiovascular:  RR with mild tachycardia, no m/r/g. No LE edema.  Respiratory:   CTA bilaterally with no wheezes/rales/rhonchi.  Normal respiratory effort. Abdomen:  soft, NT, ND Skin:  no rash or induration seen on limited exam Musculoskeletal:  appears to have hypertonicity of BUE with some developing rigidity, strength diffusely 4/5 Psychiatric:  no attempt at interaction/engagement Neurologic:  increasing UE tone/rigidity    Pertinent labs:    Glucose 171 Normal CBC   Family Communication: I spoke with her mother at the bedside and husband by telephone  Disposition: Status is: Inpatient Remains inpatient appropriate because: neurologically devastated currently  Planned Discharge Destination:  TBD    Time spent: 50 minutes  Author: Jonah Blue, MD 11/08/2022 1:18 PM  For on call review [email or web address].

## 2022-11-08 NOTE — Progress Notes (Signed)
Lead Repair on C4, FP1

## 2022-11-08 NOTE — Progress Notes (Signed)
LTM EEG hooked up with MRI safe leads. [facility] monitoring. Test button tested.

## 2022-11-08 NOTE — Progress Notes (Signed)
LTM maint complete - no skin breakdown serviced C4 [facility] monitored, Event button test confirmed by [facility].

## 2022-11-08 NOTE — Progress Notes (Signed)
Pt gradually requiring more verbal and tactile cues with transfers to/ from the bathroom as the shift progressed, pt hesitant requiring 2-3 people to sit on the toilet and return back to bed

## 2022-11-08 NOTE — Progress Notes (Signed)
Initial Nutrition Assessment  DOCUMENTATION CODES:   Severe malnutrition in context of acute illness/injury  INTERVENTION:   Once Cortrak placement confirmed via x-ray, initiate enteral nutrition: - Start Osmolite 1.5 @ 20 ml/hr and increase rate by 10 ml every 8 hours to goal rate of 50 ml/hr (1200 ml/day) - PROSource TF20 60 ml daily  Tube feeding regimen at goal rate provides 1880 kcal, 95 grams of protein, and 914 ml of H2O.  Monitor magnesium, potassium, and phosphorus every 12 hours for at least 6 occurrences. MD to replete as needed as pt is at risk for refeeding syndrome given severe acute malnutrition.  - Continue IV thiamine for at least 5 days due to refeeding risk  NUTRITION DIAGNOSIS:   Severe Malnutrition related to acute illness (acute encephalopathy) as evidenced by energy intake < or equal to 50% for > or equal to 5 days, mild fat depletion, moderate muscle depletion, percent weight loss (11% weight loss in 6 months).  GOAL:   Patient will meet greater than or equal to 90% of their needs  MONITOR:   Diet advancement, Labs, Weight trends, TF tolerance  REASON FOR ASSESSMENT:   Consult Assessment of nutrition requirement/status  ASSESSMENT:   57 year old female who presented to [facility] ED with AMS x 3 weeks on 7/11. 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 Pt admitted with acute encephalopathy.  07/12 - s/p LP using fluoro, transferred to [facility], regular diet 07/16 - dysphagia 1 diet with thin liquids (full supervision) 07/17 - NPO, Cortrak placement  Pt undergoing work-up for auto-immune encephalitis as well as CJD. Etiology of acute encephalopathy remains unclear.  Discussed pt with SLP and with MD. Pt made NPO today. MD spoke with pt's husband who is agreeable to Cortrak placement for enteral access. Consult received for enteral nutrition initiation and management.  Spoke with pt's mother at  bedside. Pt has been living in [facility]; husband lives in [place] and mother lives in [place]. Pt's mother believes that pt's encephalopathy likely started 2-3 weeks ago. Pt's mother is unsure how pt has been eating during this 2-3 week timeframe. When pt's mother last saw pt on Mother's Day, pt was eating normally and looked normal to her. Pt looks like she has lost weight since Mother's Day per pt's mother.  Pt's mother shares that pt has lost about 80 lbs over the last 1 year. Per pt's mother, majority of weight loss has been intentional. Pt has been taking semaglutide and phentermine per chart review. Pt's mother also reports seeing a medication called "Xyngular" on pt's countertop when pt's mother was there a few weeks ago. Jody Nolan appears to be a weight loss supplement.  Per pt's mother, pt typically weighs 150-160 lbs. Reviewed weight history in chart. Pt weighed 78.2 kg (172 lbs) on 05/01/22 and weighed 93.4 kg (205 lbs) on 03/11/21. Pt with an 8.5 kg weight loss since 05/01/22. This is a 11% weight loss in 6 months which is significant for timeframe.  Pt's mother shares that pt consumed 50% of lunch tray yesterday with significant encouragement and assistance. Pt did not eat any dinner last night and did not eat any breakfast today. Pt consumed about half of an Ensure supplement yesterday. When pt's mother tried to feed pt today prior to NPO status, pt did not initiate a swallow and food had to be suctioned from pt's mouth by RN.  RD will initiate tube feeds at trickle rate and will slowly advance to  goal. Pt is at refeeding risk. Will order refeeding labs.  Meal Completion: 25% x 2 documented meals on 7/14  Medications reviewed and include: IV solu-medrol, IV protonix, IV thiamine 500 mg daily IVF: D5-NS with KCl @ 75 ml/hr  Micronutrient Profile: Thiamine B1: 244.0 (high) Vitamin B12: 534 (WNL) CRP: 0.5 (WNL)  Labs reviewed.  NUTRITION - FOCUSED PHYSICAL EXAM:  Flowsheet Row  Most Recent Value  Orbital Region Mild depletion  Upper Arm Region No depletion  Thoracic and Lumbar Region Mild depletion  Buccal Region Mild depletion  Temple Region Mild depletion  Clavicle Bone Region Mild depletion  Clavicle and Acromion Bone Region Moderate depletion  Scapular Bone Region Mild depletion  Dorsal Hand Moderate depletion  Patellar Region Mild depletion  Anterior Thigh Region Mild depletion  Posterior Calf Region No depletion  Edema (RD Assessment) None  Hair Reviewed  Eyes Reviewed  Mouth Reviewed  Skin Reviewed  Nails Reviewed    Diet Order:   Diet Order             Diet NPO time specified  Diet effective now                   EDUCATION NEEDS:   Education needs have been addressed  Skin:  Skin Assessment: Reviewed RN Assessment  Last BM:  11/05/22 medium type 2  Height:   Ht Readings from Last 1 Encounters:  11/03/22 5' 5.51" (1.664 m)    Weight:   Wt Readings from Last 1 Encounters:  11/03/22 69.7 kg    BMI:  25.17 kg/m2 (calculated using height and weight from 7/12)  Estimated Nutritional Needs:   Kcal:  1800-2000  Protein:  90-105 grams  Fluid:  1.8-2.0 L    Mertie Clause, MS, RD, LDN Inpatient Clinical Dietitian Please see AMiON for contact information.

## 2022-11-08 NOTE — Plan of Care (Signed)

## 2022-11-09 DIAGNOSIS — E43 Unspecified severe protein-calorie malnutrition: Secondary | ICD-10-CM | POA: Diagnosis present

## 2022-11-09 LAB — BASIC METABOLIC PANEL
Anion gap: 7 (ref 5–15)
BUN: 14 mg/dL (ref 6–20)
CO2: 26 mmol/L (ref 22–32)
Calcium: 9 mg/dL (ref 8.9–10.3)
Chloride: 104 mmol/L (ref 98–111)
Creatinine, Ser: 0.57 mg/dL (ref 0.44–1.00)
GFR, Estimated: >60 mL/min (ref >60)
Glucose, Bld: 141 mg/dL — ABNORMAL HIGH (ref 70–99)
Potassium: 3.6 mmol/L (ref 3.5–5.1)
Sodium: 137 mmol/L (ref 135–145)

## 2022-11-09 LAB — CBC WITH DIFFERENTIAL/PLATELET
Abs Immature Granulocytes: 0.03 10*3/uL (ref 0.00–0.07)
Basophils Absolute: 0 10*3/uL (ref 0.0–0.1)
Basophils Relative: 0 %
Eosinophils Absolute: 0 10*3/uL (ref 0.0–0.5)
Eosinophils Relative: 0 %
HCT: 41.4 % (ref 36.0–46.0)
Hemoglobin: 13.3 g/dL (ref 12.0–15.0)
Immature Granulocytes: 0 %
Lymphocytes Relative: 7 %
Lymphs Abs: 0.6 10*3/uL — ABNORMAL LOW (ref 0.7–4.0)
MCH: 30 pg (ref 26.0–34.0)
MCHC: 32.1 g/dL (ref 30.0–36.0)
MCV: 93.5 fL (ref 80.0–100.0)
Monocytes Absolute: 0.6 10*3/uL (ref 0.1–1.0)
Monocytes Relative: 7 %
Neutro Abs: 8 10*3/uL — ABNORMAL HIGH (ref 1.7–7.7)
Neutrophils Relative %: 86 %
Platelets: 293 10*3/uL (ref 150–400)
RBC: 4.43 MIL/uL (ref 3.87–5.11)
RDW: 12.8 % (ref 11.5–15.5)
WBC: 9.3 10*3/uL (ref 4.0–10.5)
nRBC: 0 % (ref 0.0–0.2)

## 2022-11-09 LAB — PHOSPHORUS
Phosphorus: 2.7 mg/dL (ref 2.5–4.6)
Phosphorus: 2.8 mg/dL (ref 2.5–4.6)

## 2022-11-09 LAB — GLUCOSE, CAPILLARY
Glucose-Capillary: 168 mg/dL — ABNORMAL HIGH (ref 70–99)
Glucose-Capillary: 146 mg/dL — ABNORMAL HIGH (ref 70–99)
Glucose-Capillary: 128 mg/dL — ABNORMAL HIGH (ref 70–99)
Glucose-Capillary: 205 mg/dL — ABNORMAL HIGH (ref 70–99)

## 2022-11-09 LAB — MAGNESIUM
Magnesium: 2.5 mg/dL — ABNORMAL HIGH (ref 1.7–2.4)
Magnesium: 2.3 mg/dL (ref 1.7–2.4)

## 2022-11-09 NOTE — Progress Notes (Signed)
PT Cancellation Note  Patient Details Name: Jody Nolan MRN: [id number] DOB: Dec 07, 1965   Cancelled Treatment:    Reason Eval/Treat Not Completed: Other (comment). Pt with continued decline and with palliative consult pending. Will monitor for continued appropriateness.    [name] [facility] 11/09/2022, 10:06 AM Skip Mayer PT Acute Rehabilitation Services Office [phone]

## 2022-11-09 NOTE — Progress Notes (Signed)
Speech Language Pathology Treatment: Dysphagia  Patient Details Name: Jody Nolan MRN: [id number] DOB: 28-Mar-1966 Today's Date: 11/09/2022 Time: 1445-1510 SLP Time Calculation (min) (ACUTE ONLY): 25 min  Assessment / Plan / Recommendation Clinical Impression  Patient seen by SLP for skilled treatment focused on dysphagia goals. Spouse was in the room as well and plan is for discussion on GOC with attending MD today when patient's mother arrives. Patient herself was awake and alert and more interactive than she had been when this SLP first met her on 7/16. She continues to be non-verbal however.  After oral care, SLP administered small ice chips and after slow mastication with first, she was more efficient when masticating rest of ice chips. She was then able to hold cup of water in her left hand and with tactile cues of SLP gently guiding cup up to lips, she was then able to complete this action and exhibited appropriate oral preparatory phase and then took sips of water with only brief oral holding. Swallow initiation was timely except for a couple instances of questionable delay. No coughing observed. Patient was then able to hold spoon and give self bites of pudding, exhibiting mild oral delay but full clearance from oral cavity after swallow. SLP recommending continue NPO but allow floor stock puree/pudding solids and thin liquids. She is not consistently alert and interactive enough throughout the day for full meal trays. SLP will continue to follow.    HPI HPI: Pt is a 57 yo female presenting to [facility] 7/10 with several weeks of worsening AMS; transferred to [facility] 7/12. MRI of brain showed widespread, bilateral abnormal cortical diffusion and FLAIR signal in a nonspecific pattern. 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 SLP bedside swallow evaluation ordered on 7/16 due to  concern of patient holding PO's in mouth and having potential decline in swallowing abilities. She had Cortrak plaed 7/17 but pulled it out later that date.      SLP Plan  Continue with current plan of care      Recommendations for follow up therapy are one component of a multi-disciplinary discharge planning process, led by the attending physician.  Recommendations may be updated based on patient status, additional functional criteria and insurance authorization.    Recommendations  Diet recommendations: NPO;Other(comment) (floor stock liquids and puree/pudding solids) Medication Administration: Crushed with puree Supervision: Trained caregiver to feed patient;Full supervision/cueing for compensatory strategies Compensations: Slow rate;Small sips/bites;Minimize environmental distractions Postural Changes and/or Swallow Maneuvers: Seated upright 90 degrees                  Oral care BID;Oral care before and after PO   Frequent or constant Supervision/Assistance Dysphagia, unspecified (R13.10)     Continue with current plan of care     Angela Nevin, MA, CCC-SLP Speech Therapy

## 2022-11-09 NOTE — Progress Notes (Signed)
vLTM maintenance  No skin breakdown noted at FP1  FP2  F4  T4   All impedances below 10khms

## 2022-11-09 NOTE — Progress Notes (Signed)
Upon entering room during hand-off pt observed to have removed coretrack and appeared to be restless in bed with intermittent facial grimacing. HR in the 120s. Pt spontaneously opening eyes but not tracking or following commands. Non-verbal. Within 10 min pt was at rest again with no visual signs of distress and HR had come back down to the 80s. MD placed 1mg  ativan for agitation. Holding for now as pt is calm.

## 2022-11-09 NOTE — Progress Notes (Signed)
Progress Note   Patient: Jody Nolan EAV:[id number] DOB: Oct 06, 1965 DOA: 11/03/2022     6 DOS: the patient was seen and examined on 11/09/2022   Brief hospital course: 57yo with PMH of depression, anxiety, DUB presented on 7/10 to [facility] with complaints of confusion.  Neurology and ID were consulted. Underwent further workup including LP, MRI brain, and EEG.  MRI brain showed lateral abnormal cortical diffusion and FLAIR signal and EEG showed triphasic waves as well as severe encephalopathy.  Patient was started on IV Keppra and was sent to [facility] for long-term EEG monitoring.  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  She has not responded to steroids, due to complete them on 7/20 and family wishes to continue.  Family at this time wants to wait until someone can tell them definitively that this is a terminal condition - which might mean waiting for the CJD testing. They are uncertain about replacing the Cortrak and will keep thinking about that overnight. She does seem to know what bothers her - and the Cortrak is one of those things - so they don't think she is having pain and do not want a narcotic drip.   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 Repeating LTM EEG Repeat MRI on 7/17 with extensive cortical hyperintensity with associated restricted diffusion, most c/w CJD Appreciate neurology follow-up. Workup including RPR, HIV, TSH, B12 unremarkable. B1 mildly elevated. Thyroid antibodies unremarkable. ANA negative Autoimmune encephalitis panel negative heavy metal screen negative.   Lyme serology negative. Started on IV doxycycline but now off.  ID followed, appears  to have signed off. Started on IV Keppra. Started on IV steroids on 7/15 x 5 days.  History of anxiety and depression Not on any medication. Currently undergoing significant stress in her life unsure if that is contributing to her acute worsening but this is increasingly unlikely to be the cause.  Unintentional weight loss Reported history.  Unclear about exact extent of weight loss. Could be related to mood disorder. Monitor for now. Receiving thiamine therapy for possible encephalopathy related to that Nutrition Problem: Severe Malnutrition Etiology: acute illness (acute encephalopathy) Signs/Symptoms: energy intake < or equal to 50% for > or equal to 5 days, mild fat depletion, moderate muscle depletion, percent weight loss (11% weight loss in 6 months) Percent weight loss: 11 % Interventions: Tube feeding, MVI   History of tick bite Reported history of tick bite. Concern for CNS encephalitis in the setting of tickborne illness. ID following.   Likely unrelated. No longer on Doxy.  Disposition Increasing UE tone, rigidity Overall, appears to have a very poor prognosis Family seems to be aware and understand  They are receptive to palliative care consultation - ordered on 7/17 Family is moving towards comfort care sequentially Patient is now DNR Will stop acute PT/OT therapies Family is still considering replacement of Cortrak  Assessment and Plan: No notes have been filed under this hospital service. Service: Hospitalist       Consultants: Neurology ID Palliative care PT/OT SLP [facility] team  Procedures: EEG 7/12 LTM EEG 7/13, 7/18 Cortrak - placed and nearly immediately removed by patient on 7/17 LP (by IR at [facility] on 7/12, PTA)  Subjective: Eyes closed throughout PM visit, awake and alert without attempt to track or engage this AM.  She did follow limited commands.  Husband reports that she was "dancing" in bed and smiling/joking with limited speech  today.   Objective: Vitals:   11/09/22 1110 11/09/22 1537  BP: 131/88 (!) 129/96  Pulse: 93 (!) 107  Resp: 15 17  Temp: 98.2 F (36.8 C) 99.1 F (37.3 C)  SpO2: 98% 93%    Intake/Output Summary (Last 24 hours) at 11/09/2022 1802 Last data filed at 11/09/2022 0940 Gross per 24 hour  Intake 1859.22 ml  Output 362 ml  Net 1497.22 ml   Filed Weights   11/09/22 0325  Weight: 66 kg    Exam: General:  Awake, alert, no attempt to engage but + following commands.   Eyes:   normal lids, sclera ENT:  grossly normal lips & tongue, mmm; appropriate dentition Neck:  no LAD, masses or thyromegaly Cardiovascular:  RRR. No LE edema.  Respiratory:   CTA bilaterally with no wheezes/rales/rhonchi.  Normal respiratory effort. Abdomen:  soft, NT, ND Skin:  no rash or induration seen on limited exam; has picked off all but 2 of her artificial fingernails Musculoskeletal:  increasing hypertonicity of BUE > BLE with increasing rigidity, strength diffusely 4/5 Psychiatric:  no attempt at interaction/engagement but followed commands today Neurologic:  increasing UE tone/rigidity   Data Reviewed: I have reviewed the patient's lab results since admission.  Pertinent labs for today include:  Glucoses 141   Family Communication: Husband was present throughout the day and mother was present this afternoon during family meeting  Disposition: Status is: Inpatient Remains inpatient appropriate because: likely terminal condition  Planned Discharge Destination:  TBD    Time spent: 70 minutes  Author: Jonah Blue, MD 11/09/2022 6:02 PM  For on call review [email or web address].

## 2022-11-09 NOTE — TOC CM/SW Note (Signed)
Transition of Care [facility]) - Inpatient Brief Assessment   Patient Details  Name: Jody Nolan MRN: [id number] Date of Birth: 1965/08/30  Transition of Care [facility] CM/SW Contact:    Darrold Span, RN Phone Number: 11/09/2022, 2:14 PM   Clinical Narrative: Note PC has been consulted, We will continue to monitor patient advancement through interdisciplinary progression rounds. If new patient transition needs arise, please place a TOC consult.   Transition of Care Asessment: Insurance and Status: Insurance coverage has been reviewed Patient has primary care physician: Yes Home environment has been reviewed: home w/ spouse Prior level of function:: self Prior/Current Home Services: No current home services Social Determinants of Health Reivew: SDOH reviewed no interventions necessary Readmission risk has been reviewed: Yes Transition of care needs: transition of care needs identified, TOC will continue to follow

## 2022-11-09 NOTE — Progress Notes (Addendum)
OT Cancellation Note  Patient Details Name: Jody Nolan MRN: [id number] DOB: 1966/03/10   Cancelled Treatment:    Reason Eval/Treat Not Completed:  Pt. With noted decline along with pending palliative consult.  Will monitor for continued appropriateness.     Alessandra Bevels Lorraine-COTA/L 11/09/2022, 10:40 AM

## 2022-11-09 NOTE — Plan of Care (Signed)
  Problem: Clinical Measurements: Goal: Ability to maintain clinical measurements within normal limits will improve Outcome: Progressing Goal: Will remain free from infection Outcome: Progressing Goal: Cardiovascular complication will be avoided Outcome: Progressing   Problem: Elimination: Goal: Will not experience complications related to bowel motility Outcome: Progressing Goal: Will not experience complications related to urinary retention Outcome: Progressing   Problem: Safety: Goal: Ability to remain free from injury will improve Outcome: Progressing

## 2022-11-09 NOTE — Procedures (Addendum)
Patient Name: Jody Nolan  MRN: [id number]  Epilepsy Attending: Charlsie Quest  Referring Physician/Provider: Elmer Picker, NP   Duration: 11/08/2022 4696 to 11/09/2022 1115   Patient history: 57 year old female with a history of altered mental status the past 3 weeks. EEG to evaluate for seizure    Level of alertness: Awake, asleep   AEDs during EEG study: LEV   Technical aspects: This EEG study was done with scalp electrodes positioned according to the 10-20 International system of electrode placement. Electrical activity was reviewed with band pass filter of 1-70Hz , sensitivity of 7 uV/mm, display speed of 60mm/sec with a 60Hz  notched filter applied as appropriate. EEG data were recorded continuously and digitally stored.  Video monitoring was available and reviewed as appropriate.   Description: No clear posterior dominant rhythm was seen. EEG showed continuous generalized 3 to 6 Hz theta-delta slowing. Intermittent generalized and lateralized left hemisphere periodic discharges with triphasic morphology were noted at 0.5-1 Hz, more frequent when awake/stimulated. Hyperventilation and photic stimulation were not performed.     EEG was disconnected between 11/08/2022 1153 to 1310 for  imaging.     ABNORMALITY - Periodic discharges with triphasic morphology, generalized  and lateralized left hemisphere - Continuous slow, generalized   IMPRESSION: This study showed periodic discharges with triphasic morphology which now appears more predominantly in left hemisphere. This pattern is on the ictal-interictal continuum specifically the lateralized nature of the discharges which can suggest underlying cortical abnormality. Creutzfeldt-Jakob disease (CJD) can have similar EEG appearance although EEG is not diagnostic. Additionally there is moderate diffuse encephalopathy. No definite seizures were seen throughout the recording.  EEG appears similar to study on 11/06/2022   Lennox Dolberry Annabelle Harman

## 2022-11-09 NOTE — Progress Notes (Signed)
Neurology Progress Note  Brief HPI: 57 year old female with relatively abrupt onset 3 weeks ago of progressive and precipitous cognitive decline, with first symptom being short term memory impairment, forgetting key details of her daughter's stated plans for a trip multiple times, manifested as forgetting over time frames of a few hours despite repeatedly being informed/corrected regarding the information she was not retaining. A few days later, her behavior started to become altered and somewhat inappropriate. She had complained of a recent tick bite without an associate rash and was prescribed with Doxycycline for a presumed tick-borne illness at [facility] at some point. Symptoms progressed to severe confusion requiring admission to the hospital in [facility], near her residence there. She was determined to be safe for discharge after some testing that did not yield a diagnosis. Family then drove her up to [place] to be watched closely and cared for by them, as she seemed to be precipitously losing her ability to manage her own affairs. After further decline, they brought her to [facility] to be evaluated. Work up there included LP as well as MRI brain, which revealed multifocal cortical ribboning on DWI and T2, as well as abnormal basal ganglia signal changes, but no abnormal enhancement.   Again, symptoms of progressively worsening confusion and altered mental status are thought to have been going on for about 3 weeks. Possibilities include autoimmune or infectious encephalitis, though a bacterial or one of the more common viral infectious etiologies are less likely given no fever and negative meningitis/encephalitis CSF PCR panel. Exam reveals a severely abulic patient without myoclonus or tremor. Tone is normal, with no waxy rigidity noted. CSF labs negative for infection so far. There is mild nonspecific elevation of CSF WBCs. CSF Protein and glucose are normal. EEG with triphasic waves.    Subjective: Patient seen in room with her husband at the bedside.  She is less interactive today but did speak to her husband a few times earlier.  She had an NG tube placed yesterday but removed it.   Beta Isoform, RT-QuIC and tau protein have been ordered as a send-out panel to the [facility] at [facility] from the remaining CSF at [facility] to assess for possible CJD. Spoke with lab at [facility] and it is being sent. Pathology and Infection Prevention notified as well.  Exam: Vitals:   11/09/22 0325 11/09/22 0744  BP: 98/71 98/71  Pulse: 78 81  Resp: 15 14  Temp: 98.4 F (36.9 C) 98.4 F (36.9 C)  SpO2: 97% 100%   Gen: In bed, NAD Resp: non-labored breathing, no acute distress Abd: soft, nt   Neuro: Mental Status: Awake with eyes open but minimally interactive.  Does not track examiner Cranial Nerves:  II: PERRL. Visual fields full to confrontation.  III, IV, VI: EOM intact. Eyelids elevate symmetrically.  VII: no facial asymmetry   VIII: hearing intact to voice Motor:  RUE: 4/5         LUE: 4/5 RLE 4/5           LLE: 4/5  Tone increased in upper extremities and bulk is normal Rigidity noted with elevating extremities No myoclonus or tremor noted.  Sensation- Intact to light touch bilaterally Coordination: Unable to perform Gait- Deferred     Pertinent Labs: Electrolytes normal CBC normal RPR negative UDS negative EtOH and salicylate: Negative Glucose 98 CRP - 0.5  Sed rate- 10 TSH- 2.862 T4, free- 1.04 Thyroglobulin antibody negative Thyroid peroxidase 26 Vitamin B1 -  244 Vitamin B12 normal ANA w/ reflex pending ANCA - <0.2 Heavy metals - negative Lime disease serology negative HIV- negative Cryptococcal antigen- negative  CSF labs resulted:             - Clear and colorless - Meningitis/encephalitis panel- negative - Protein 41 - Glucose 70 - CSF WBC- mildly elevated at 17 - Cryptococcal antigen- negative - Autoimmune  encephalitis Ab negative -Pathologist smear review of CSF was negative for malignancy with monocytes, lymphocytes and rare neutrophils - [place] IgG- Negative - [place] IgM- Negative -IgG index albumin slightly elevated at 43 -CSF VDRL negative  CSF labs pending:  Oligoclonal bands Ehrlichia Arbovirus IgG and IgM Paraneoplastic Ab  Autoimmune encephalitis anti NMDA receptor Beta isoform pending RT-quic pending Tile protein pending  Imaging Reviewed: CTA head and neck  1. Patent vasculature of the head and neck without hemodynamically significant or occlusion. No evidence of vasospasm or RCVS in the intracranial vasculature. 2. 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  3. 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   MRI brain Widespread, bilateral abnormal cortical diffusion with ribboning and correlating FLAIR signal in a nonspecific pattern. There is also asymmetric caudate nucleus involvement also on the left. But white matter seems spared, there is no associated abnormal enhancement, and no intracranial mass effect. Broad differential diagnosis at this point and recommend Neurology consultation with consideration of: Infectious Meningoencephalitis (although lack of enhancement argues against), Reversible cerebral vasoconstriction (RCVS), Urea cycle disorders/Uremia, hypoglycemia, Seizure disorder, PRES (although sparing of white matter argues against), Hepatic encephalopathy, Mitochondrial disorder, and Creutzfeldt-jakob disease.  Repeat MRI brain 7/17 IMPRESSION: Extensive confluent areas of cerebral cortical and caudate nuclei T2 hyperintensity with associated restricted diffusion without mass effect or contrast enhancement. 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 Profound hypoxic injury could have similar  appearance.   LTM EEG 11/04/2022 0202 to 11/04/2022 0915  ABNORMALITY - Periodic discharges with triphasic morphology, generalized ( GPDs) - Continuous slow, generalized  IMPRESSION: This study showed generalized periodic discharges with triphasic morphology which can be on the ictal-interictal continuum. However, the morphology, frequency and reactivity to stimulation is more commonly suggestive of toxic-metabolic causes. Additionally there is moderate diffuse encephalopathy. No seizures were seen throughout the recording.  LTM EEG 11/05/2022 0202 to 11/05/2022 0956  ABNORMALITY - Periodic discharges with triphasic morphology, generalized  and lateralized left hemisphere - Continuous slow, generalized  IMPRESSION: This study showed periodic discharges with triphasic morphology which now appears more predominantly in left hemisphere. This pattern is on the ictal-interictal continuum specifically the lateralized nature of the discharges which can suggest underlying cortical abnormality. Creutzfeldt-Jakob disease (CJD) can have similar EEG appearance although EEG is not diagnostic. Additionally there is moderate diffuse encephalopathy. No definite seizures were seen throughout the recording.  LTM EEG 715 Periodic discharges with triphasic morphology, generalized and lateralized left hemisphere  LTM EEG 7/18 Periodic discharges with triphasic morphology, generalized and lateralized left hemisphere, continuous slow generalized  Assessment: 57 year old female with relatively abrupt onset severe confusion with cortical ribboning on MRI. Symptoms of rapidly and progressively worsening confusion and altered mental status are endorsed by family as having been going on for about 3 weeks. Possibilities include encephalitis, autoimmune or infectious, though a bacterial or viral infectious etiology is less likely given no fever and negative meningitis/encephalitis CSF PCR panel.  - Exam reveals a severely  abulic patient  without myoclonus or tremor. Tone is increased. Rigidity noted in upper and lower extremities - CSF labs do not militate in favor of infection. There is mild nonspecific elevation of CSF WBCs. Protein and glucose are normal.  - EEG shows no electrographic seizures to suggest an epileptic etiology for her brain lesions. However, the triphasic waves are suggestive of possible CJD. Per the literature, typical EEG findings in CJD include bi-anterior dominant periodic triphasic sharp wave complexes, lasting 600-1059ms, occuring at a rate of 0.5 to 2Hz .  - MRI findings as documented above include multifocal cortical ribboning and anterior basal ganglia diffuse hyperintensity.   - Acute thiamine deficiency is unlikely, but will treat with empiric thiamine -Empiric steroids started 7/15 after discussion with ID, but patient has shown no response with steroid treatment - DDx:  - CJD  - Paraneoplastic syndrome - Autoimmune encephalitis such as a rare imaging presentation of ADEM, would be worthwhile to start high-dose steroids if infectious disease service has no objection to this - Arbovirus or other CNS-trophic virus not detectable on CSF PCR panel is also possible, and is felt to be an intermediate possibility based on literature review of cases including MRI imaging.  - The imaging findings are not typical for Lyme disease, which tends more to affect the white matter.  - Imaging also not very consistent with RMSF, which tends to show multiple much smaller lesions in the white matter (aka "starry sky" pattern), rather than the grey matter involvement seen on MRI of our patient.      Recommendations: - Continue high-dose empiric thiamine - Continue Keppra 1 g twice daily after 2 g load - RT-QuIC and 14-3-3 ordered along with tau as a send out to the [facility] at [facility]. The approximate current sensitivity of CSF RT-QuIC is 92% and the specificity is 100%.   - No specific isolation precautions for suspected CJD specific to [place] can be found on literature search. Per [facility] guidelines, there is no evidence that normal social or routine contact with a CJD patient represents a risk for healthcare workers, household members, or others. Isolation of patients with CJD is not necessary for routine examinations, etc. However, IF there are any interventions, equipment has to be sequestered (or all disposable) until a diagnosis is found. .  - CT of chest/abdomen/pelvis with contrast to assess for possible malignancy  - Has been seen by ID at [facility], but has not yet been seen by ID team at [facility]. Obtaining ID consult here. Discussed with Dr. Drue Second via SecureChat.  - started empiric IV steroids after clearance from Hospitalist team. Hospitalist team also discussed with ID and obtianed their clearance per my discussion with them. - Paraneoplastic panel pending    Patient seen and examined by NP/APP with MD. MD to update note as needed.   Cortney E Ernestina Columbia , MSN, AGACNP-BC [facility] See Amion for schedule and pager information 11/09/2022 9:46 AM  NEUROHOSPITALIST ADDENDUM Performed a face to face diagnostic evaluation.   I have reviewed the contents of history and physical exam as documented by PA/ARNP/Resident and agree with above documentation.  I have discussed and formulated the above plan as documented. Edits to the note have been made as needed.  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 So far extensive autoimmune/infectious workup is non revealing.  Erick Blinks, MD [facility] [phone]   If 7pm to 7am, please  call on call as listed on AMION.

## 2022-11-09 NOTE — Progress Notes (Signed)
Per speech and Attending patient okay to have floor stock snacks like pudding, ice cream and applesauce. Per husband, patient ate 1 whole pudding cup. Will continue to monitor patient.

## 2022-11-10 DIAGNOSIS — Z7189 Other specified counseling: Secondary | ICD-10-CM

## 2022-11-10 LAB — OLIGOCLONAL BANDS, CSF + SERM

## 2022-11-10 MED ORDER — SODIUM CHLORIDE 0.9 % IV SOLN
1000.0000 mg | INTRAVENOUS | Status: AC
  Administered 2022-11-11 – 2022-11-12 (×2): 1000 mg via INTRAVENOUS
  Filled 2022-11-10 (×2): qty 16

## 2022-11-10 MED ORDER — PANTOPRAZOLE SODIUM 40 MG IV SOLR
40.0000 mg | INTRAVENOUS | Status: AC
  Administered 2022-11-11 – 2022-11-12 (×2): 40 mg via INTRAVENOUS
  Filled 2022-11-10 (×2): qty 10

## 2022-11-10 NOTE — Progress Notes (Signed)
Brief Nutrition Note  Discussed pt with Palliative Medicine NP. Per discussion, family would like to hold off on replacing Cortrak at this time. NP shares that pt's mother believed Cortrak placement was very uncomfortable for pt, and that having a feeding tube in place increased agitation.  Pt has been consuming floor-stock liquids and puree/pudding solids per SLP recommendations. Otherwise, pt is NPO as she is not consistently alert and interactive enough throughout the day for full meal trays per SLP note. Family would like to proceed with continuing to offer floor-stock items over the weekend and reevaluate for Cortrak replacement on Monday.  GOC discussions are ongoing.  RD will continue to follow pt during admission.   Mertie Clause, MS, RD, LDN Inpatient Clinical Dietitian Please see AMiON for contact information.

## 2022-11-10 NOTE — Procedures (Signed)
Patient Name: Jody Nolan  MRN: [id number]  Epilepsy Attending: Charlsie Quest  Referring Physician/Provider: Elmer Picker, NP   Duration: 11/09/2022 1115 to 11/10/2022 1022   Patient history: 57 year old female with a history of altered mental status the past 3 weeks. EEG to evaluate for seizure    Level of alertness: Awake, asleep   AEDs during EEG study: LEV   Technical aspects: This EEG study was done with scalp electrodes positioned according to the 10-20 International system of electrode placement. Electrical activity was reviewed with band pass filter of 1-70Hz , sensitivity of 7 uV/mm, display speed of 43mm/sec with a 60Hz  notched filter applied as appropriate. EEG data were recorded continuously and digitally stored.  Video monitoring was available and reviewed as appropriate.   Description: No clear posterior dominant rhythm was seen. EEG showed continuous generalized 3 to 6 Hz theta-delta slowing. Intermittent generalized and lateralized left hemisphere periodic discharges with triphasic morphology were noted at 0.5-1.5 Hz, more frequent when awake/stimulated. Hyperventilation and photic stimulation were not performed.      ABNORMALITY - Periodic discharges with triphasic morphology, generalized  and lateralized left hemisphere - Continuous slow, generalized   IMPRESSION: This study showed periodic discharges with triphasic morphology which now appears more predominantly in left hemisphere. This pattern is on the ictal-interictal continuum specifically the lateralized nature of the discharges which can suggest underlying cortical abnormality. Creutzfeldt-Jakob disease (CJD) can have similar EEG appearance although EEG is not diagnostic. Additionally there is moderate diffuse encephalopathy. No definite seizures were seen throughout the recording.  Kayvon Mo Annabelle Harman

## 2022-11-10 NOTE — Consult Note (Signed)
Palliative Medicine Inpatient Consult Note  Consulting Provider: Jonah Blue, MD   Reason for consult:   Palliative Care Consult Services Palliative Medicine Consult  Reason for Consult? goals of care   11/10/2022  HPI:  Per intake H&P --> 57yo with PMH of depression, anxiety, DUB presented on 7/10 to [facility] with abrupt onset 3 weeks ago of progressive and precipitous cognitive decline, with first symptom being short term memory impairment, forgetting key details of her daughter's stated plans for a trip multiple times, manifested as forgetting over time frames of a few hours despite repeatedly being informed/corrected regarding the information she was not retaining. A few days later, her behavior started to become altered and somewhat inappropriate. Neurology and ID were consulted. Being worked up to rule of CJD, paraneoplastic syndrome,  autoimmune encephalitis, arbovirus, lyme disease, and/or RMSF.   Palliative care has been consulted in the setting of this very difficult situation to support family and help discern goals of care if improvement neglect to be seen.   Clinical Assessment/Goals of Care:  *Please note that this is a verbal dictation therefore any spelling or grammatical errors are due to the "Dragon Medical One" system interpretation.  I have reviewed medical records including EPIC notes, labs and imaging, received report from bedside RN, assessed the patient .    I met with patients spouse, Fayrene Fearing and mother, [name] to further discuss diagnosis prognosis, GOC, EOL wishes, disposition and options.   I introduced Palliative Medicine as specialized medical care for people living with serious illness. It focuses on providing relief from the symptoms and stress of a serious illness. The goal is to improve quality of life for both the patient and the family.  Medical History Review and Understanding:  A review of patients past medical history was held inclusive of her  depression and anxiety.   Social History:  Kamill is from [place], [place]. She has been living in a condo in [place]. She has been married to her spouse for the past 28 years. They share two children aged 77. She is a former Engineer, materials though retired in 2021. She is a woman of faith practicing within the [employer] denomination.   Functional and Nutritional State:  Preceding three weeks ago - Kaylynne was fully functional of all bADLs and iADLs. She saw her husband once every few weeks.  Advance Directives:  A detailed discussion was had today regarding advanced directives.  Patient does have a living will and advanced directives.   Code Status:  Concepts specific to code status, artifical feeding and hydration, continued IV antibiotics and rehospitalization was had.  The difference between a aggressive medical intervention path and a palliative comfort care path for this patient at this time was had.   Margi is an established DNAR/DNI code status at this time.   Discussion:  We reviewed the series of events leading to hospitalization. We discussed that Tonye started to become more altered while in [facility] and her children were alerted to this via neighbors of hers. She was sent to a local hospital there and deemed okay to discharge. Anjel went to spend time with her mother afterwards and had some bizarre behaviors inclusive of disappearing with the dog at midnight. She was found though it was determined she would need to be re-hospitalized.   Patients mother and spouse note that she was initially able to speak when first admitted to [facility] though over the last nine days has gradually declined.  Patients family have spoken  at length to both Dr. Ophelia Charter and Dr. Derry Lory. 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 They do desire hearing from another neurologist to verify this as a second opinion. We discussed that this would be  pursued.   We reviewed the "what if's" if patients clinical condition continuing to deteriorate. We discussed artificial nutrition. At this time, Madelline is able to take in fluids, pudding, and oatmeal which patients family continue to encourage. Given that Stephenie was terribly distressed by the core-track prior the family has elected to allow her the weekend to eat on her own.   We discussed if patients condition continues to decline the possibility of a hospice home.   At this time patients family would prefer if we get the confirmative diagnosis prior to decisions related to the pursuit of comfort care.   Discussed the importance of continued conversation with family and their  medical providers regarding overall plan of care and treatment options, ensuring decisions are within the context of the patients values and GOCs.  Decision Maker: Krithi, Bray (Spouse): [phone] (Home Phone)   SUMMARY OF RECOMMENDATIONS   DNAR/DNI  Patients family are interested in a second opinion from neurology  Awaiting results for diagnosis confirmation  Open and honest conversations held regarding patients possible poor prognosis  PMT will continue to support patient and her family  Code Status/Advance Care Planning: DNAR/DNI  Palliative Prophylaxis:  Aspiration, Bowel Regimen, Delirium Protocol, Frequent Pain Assessment, Oral Care, Palliative Wound Care, and Turn Reposition  Additional Recommendations (Limitations, Scope, Preferences): Continue current care  Psycho-social/Spiritual:  Desire for further Chaplaincy support: Yes - [employer] Additional Recommendations: Education on CJD   Prognosis: If confirmed diagnosis very poor prognosis.   Discharge Planning: Continue current care  Vitals:   11/09/22 2315 11/10/22 0328  BP: 119/79 125/82  Pulse: 98 94  Resp: 14 17  Temp: 98.7 F (37.1 C) 98.8 F (37.1 C)  SpO2: 95% 97%    Intake/Output Summary (Last 24 hours) at 11/10/2022  0707 Last data filed at 11/10/2022 0700 Gross per 24 hour  Intake --  Output 762 ml  Net -762 ml   Last Weight  Most recent update: 11/09/2022  3:27 AM    Weight  66 kg (145 lb 8.1 oz)            Gen:  Middle aged woman in NAD HEENT: moist mucous membranes CV: Regular rate and rhythm  PULM:  On RA, breathing is even and nonlabored ABD: soft/nontender  EXT: No edema  Neuro: Disoriented  PPS: 50%   This conversation/these recommendations were discussed with patient primary care team, Dr. Ophelia Charter  Billing based on MDM: High ______________________________________________________ Lamarr Lulas [facility] Palliative Medicine Team Team Cell Phone: [phone] Please utilize secure chat with additional questions, if there is no response within 30 minutes please call the above phone number  Palliative Medicine Team providers are available by phone from 7am to 7pm daily and can be reached through the team cell phone.  Should this patient require assistance outside of these hours, please call the patient's attending physician.

## 2022-11-10 NOTE — Progress Notes (Signed)
ID PROGRESS NOTE  In reviewing CSF labs, will ask micro to send out arbovirus panel to [facility] clinic if there is remaining CSF. Also will do serology for powassan to [facility] clinic. These additional tests are for thoroughness and suspect that CJD is higher on the differential- which tests are pending to return over next 2 -2.5 wks.  ID will sign off. Call us back when needed.  Duke Salvia Drue Second MD MPH [facility] [phone]

## 2022-11-10 NOTE — Progress Notes (Signed)
Progress Note   Patient: Jody Nolan RUE:[id number] DOB: 1965/11/22 DOA: 11/03/2022     7 DOS: the patient was seen and examined on 11/10/2022    Brief hospital course: 57yo with PMH of depression, anxiety, DUB presented on 7/10 to [facility] with complaints of confusion.  Neurology and ID were consulted. Underwent further workup including LP, MRI brain, and EEG.  MRI brain showed lateral abnormal cortical diffusion and FLAIR signal and EEG showed triphasic waves as well as severe encephalopathy.  Patient was started on IV Keppra and was sent to [facility] for long-term EEG monitoring.  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  She has not responded to steroids, due to complete them on 7/20 and family wishes to complete the 5 days.  Family at this time wants to wait to transition to comfort measures until someone can tell them definitively that this is a terminal condition - which might mean waiting for the CJD testing. They are uncertain about replacing the Cortrak and will hold on this through the weekend in the hopes that she can continue to take some PO intake. She does seem to know what bothers her - and the Cortrak is one of those things - so they don't think she is having pain and do not want a narcotic drip.   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 Repeating LTM EEG Repeat MRI on 7/17 with extensive cortical hyperintensity with associated restricted diffusion, most c/w CJD Appreciate neurology follow-up. Workup including RPR, HIV, TSH, B12 unremarkable. B1 mildly elevated. Thyroid antibodies unremarkable. ANA negative Autoimmune encephalitis panel negative heavy metal screen negative.    Lyme serology negative. Started on IV doxycycline but now off.  ID followed, appears to have signed off. Started on IV Keppra. Started on IV steroids on 7/15 x 5 days Palliative care is consulting Neurology 2nd opinion requested by family - will do in house with overnight neurologist rather than transferring to tertiary center  History of anxiety and depression Not on any medication. Currently undergoing significant stress in her life unsure if that is contributing to her acute worsening but this is increasingly unlikely to be the cause.  Unintentional weight loss Reported history.  Unclear about exact extent of weight loss. Could be related to mood disorder. Monitor for now. Receiving thiamine therapy for possible encephalopathy related to that Nutrition Problem: Severe Malnutrition Etiology: acute illness (acute encephalopathy) Signs/Symptoms: energy intake < or equal to 50% for > or equal to 5 days, mild fat depletion, moderate muscle depletion, percent weight loss (11% weight loss in 6 months) Percent weight loss: 11 % Interventions: Tube feeding, MVI   History of tick bite Reported history of tick bite. Concern for CNS encephalitis in the setting of tickborne illness - appears to have been ruled out ID followed initially No longer on Doxy.  Disposition Increasing UE tone, rigidity Overall, appears to have a very poor prognosis Family seems to be aware and understand  They are receptive to palliative care consultation, which happened initially on 7/19 Family is moving towards comfort care sequentially Patient is now DNR Will stop acute PT/OT therapies Family is still considering replacement of Cortrak but will hold through the weekend and continue to encourage PO intake  Consultants: Neurology ID Palliative care PT/OT SLP [facility] team   Procedures: EEG 7/12 LTM EEG 7/13, 7/18 Cortrak - placed and nearly immediately removed by patient on 7/17 LP (by IR at  [facility] on 7/12, PTA)     Subjective: Patient was alert and sitting in the chair, being fed ice cream this AM by her mother.  No new issues.   Objective: Vitals:   11/10/22 1300 11/10/22 1400  BP:    Pulse: 93 (!) 107  Resp: 19 (!) 24  Temp:    SpO2: 97% 96%    Intake/Output Summary (Last 24 hours) at 11/10/2022 1502 Last data filed at 11/10/2022 0700 Gross per 24 hour  Intake --  Output 400 ml  Net -400 ml   Filed Weights   11/09/22 0325 11/10/22 0700  Weight: 66 kg 66 kg    Exam:  General:  Awake, alert, no attempt to engage but + following commands.   Eyes:   normal lids, sclera ENT:  grossly normal lips & tongue, mmm; appropriate dentition Neck:  no LAD, masses or thyromegaly Cardiovascular:  RRR. No LE edema.  Respiratory:   CTA bilaterally with no wheezes/rales/rhonchi.  Normal respiratory effort. Abdomen:  soft, NT, ND Skin:  no rash or induration seen on limited exam; has picked off all but 2 of her artificial fingernails Musculoskeletal:  increasing hypertonicity of BUE > BLE with increasing rigidity, strength diffusely 4/5 Psychiatric:  no attempt at interaction/engagement but follows commands periodically and smiles Neurologic:  increasing UE tone/rigidity; RUE is contracting but she is still able to move it and follows commands  Data Reviewed: I have reviewed the patient's lab results since admission.  Pertinent labs for today include:  None   Family Communication: Mother was present throughout evaluation  Disposition: Status is: Inpatient Remains inpatient appropriate because: critically ill  Planned Discharge Destination:  TBD    Time spent: 50 minutes  Author: Jonah Blue, MD 11/10/2022 3:02 PM  For on call review [email or web address].

## 2022-11-10 NOTE — Progress Notes (Signed)
SLP Cancellation Note  Patient Details Name: Jody Nolan MRN: [id number] DOB: [birth year]   Cancelled treatment:       Reason Eval/Treat Not Completed: Other (comment) attempted to see pt this morning - family is currently meeting with palliative care. Will f/u as able.    Mahala Menghini., M.A. CCC-SLP Acute Rehabilitation Services Office [phone]  Secure chat preferred  11/10/2022, 10:59 AM

## 2022-11-10 NOTE — Progress Notes (Signed)
LTM EEG discontinued - no skin breakdown at unhook.   

## 2022-11-10 NOTE — Progress Notes (Signed)
Speech Language Pathology Treatment: Dysphagia  Patient Details Name: Jody Nolan MRN: [id number] DOB: 1966/02/23 Today's Date: 11/10/2022 Time: 1660-6301 SLP Time Calculation (min) (ACUTE ONLY): 19 min  Assessment / Plan / Recommendation Clinical Impression  Per review of chart and discussion with RN, family is wanting to leave cortrak out at least through the weekend. Family confirms, and says that she has done better with PO intake yesterday and today (less coughing, oral holding, but still with limited intake). SLP provided hand-over-hand assist to facilitate self-feeding and increase awareness to PO intake. Pt consumed thin liquids and purees with only one instance of prolonged oral holding that did result in coughing. Other intake is not associated with s/s of possible aspiration, but there is still concern for inadequate nutrition/hydration. Would continue to offer small bites of purees and sips of thin liquids from the floor stock with careful assistance during feeding. Family has been helping her a lot with this already. At this time pt does not seem ready for full meal trays; however, if family continues to prefer holding off on cortrak and she continues to do well with snacks from the floor, MD could consider starting a diet of Dys 1 solids and thin liquids to at least offer more variety.    HPI HPI: Pt is a 57 yo female presenting to [facility] 7/10 with several weeks of worsening AMS; transferred to [facility] 7/12. MRI of brain showed widespread, bilateral abnormal cortical diffusion and FLAIR signal in a nonspecific pattern. 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 SLP bedside swallow evaluation ordered on 7/16 due to concern of patient holding PO's in mouth and having potential decline in swallowing abilities. She had Cortrak plaed 7/17 but pulled it out later that date.       SLP Plan  Continue with current plan of care      Recommendations for follow up therapy are one component of a multi-disciplinary discharge planning process, led by the attending physician.  Recommendations may be updated based on patient status, additional functional criteria and insurance authorization.    Recommendations  Diet recommendations: Other(comment) (floor stock liquids and puree/pudding solids) Liquids provided via: Teaspoon;Cup;Straw Medication Administration: Crushed with puree Supervision: Trained caregiver to feed patient;Full supervision/cueing for compensatory strategies Compensations: Slow rate;Small sips/bites;Minimize environmental distractions Postural Changes and/or Swallow Maneuvers: Seated upright 90 degrees                  Oral care BID;Oral care before and after PO   Frequent or constant Supervision/Assistance Dysphagia, unspecified (R13.10)     Continue with current plan of care     Mahala Menghini., M.A. CCC-SLP Acute Rehabilitation Services Office [phone]  Secure chat preferred   11/10/2022, 12:58 PM

## 2022-11-11 ENCOUNTER — Other Ambulatory Visit: Payer: Self-pay

## 2022-11-11 DIAGNOSIS — A8100 Creutzfeldt-Jakob disease, unspecified: Secondary | ICD-10-CM

## 2022-11-11 LAB — MISC LABCORP TEST (SEND OUT): Labcorp test code: 505535

## 2022-11-11 MED ORDER — CHLORHEXIDINE GLUCONATE CLOTH 2 % EX PADS
6.0000 | MEDICATED_PAD | Freq: Every day | CUTANEOUS | Status: DC
  Administered 2022-11-12 – 2022-11-16 (×5): 6 via TOPICAL

## 2022-11-11 MED ORDER — SODIUM CHLORIDE 0.9% FLUSH: 10.0000 mL | INTRAVENOUS | Status: DC | PRN

## 2022-11-11 MED ORDER — SODIUM CHLORIDE 0.9% FLUSH
10.0000 mL | Freq: Two times a day (BID) | INTRAVENOUS | Status: DC
  Administered 2022-11-11 – 2022-11-13 (×4): 10 mL
  Administered 2022-11-14: 20 mL
  Administered 2022-11-15: 10 mL
  Administered 2022-11-15: 20 mL
  Administered 2022-11-16: 10 mL

## 2022-11-11 NOTE — Progress Notes (Signed)
OT Cancellation Note  Patient Details Name: Jody Nolan MRN: [id number] DOB: 01/23/1966   Cancelled Treatment:    Reason Eval/Treat Not Completed: Other (comment) (OT signing off at this time per order from MD.)  Limmie Patricia, OTR/L,CBIS  Supplemental OT - [facility] and [facility] Secure Chat Preferred   11/11/2022, 8:19 AM

## 2022-11-11 NOTE — Progress Notes (Signed)
Progress Note   Patient: Jody Nolan ONG:[id number] DOB: 1965-06-02 DOA: 11/03/2022     8 DOS: the patient was seen and examined on 11/11/2022    Brief hospital course: 57yo with PMH of depression, anxiety, DUB presented on 7/10 to [facility] with complaints of confusion.  Neurology and ID were consulted. Underwent further workup including LP, MRI brain, and EEG.  MRI brain showed lateral abnormal cortical diffusion and FLAIR signal and EEG showed triphasic waves as well as severe encephalopathy.  Patient was started on IV Keppra and was sent to [facility] for long-term EEG monitoring.  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  She has not responded to steroids, due to complete them on 7/20 and family wishes to complete the 5 days.  Family at this time wants to wait to transition to comfort measures until someone can tell them definitively that this is a terminal condition - which might mean waiting for the CJD testing. They are uncertain about replacing the Cortrak and will hold on this through the weekend in the hopes that she can continue to take some PO intake. She does seem to know what bothers her - and the Cortrak is one of those things - so they don't think she is having pain and do not want a narcotic drip.  As the diagnosis becomes more apparent, more family is coming to town to spend time with her.  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 Repeating LTM EEG Repeat MRI on 7/17 with extensive cortical hyperintensity with associated restricted diffusion, most c/w CJD Appreciate neurology follow-up. Workup including RPR, HIV, TSH, B12 unremarkable. B1 mildly elevated. Thyroid antibodies  unremarkable. ANA negative Autoimmune encephalitis panel negative heavy metal screen negative.   Lyme serology negative. Started on IV doxycycline but now off.  ID followed but has signed off. Started on IV Keppra. Started on IV steroids on 7/15 x 5 days Palliative care is consulting [facility] neurologists will have seen patient and rendered an opinion by Monday, 7/22 Awaiting definitive testing result for CJD  History of anxiety and depression Not on any medication. Currently undergoing significant stress in her life unsure if that is contributing to her acute worsening but this is increasingly unlikely to be the cause.  Unintentional weight loss Reported history.  Unclear about exact extent of weight loss. Could be related to mood disorder. Monitor for now. Receiving thiamine therapy for possible encephalopathy related to that Nutrition Problem: Severe Malnutrition Etiology: acute illness (acute encephalopathy) Signs/Symptoms: energy intake < or equal to 50% for > or equal to 5 days, mild fat depletion, moderate muscle depletion, percent weight loss (11% weight loss in 6 months) Percent weight loss: 11 % Interventions: Tube feeding, MVI Pulled out Cortrak within hours of placement, clearly agitated by it She is able to tolerate floor stock liquids and puree/pudding solids with slow, small sips/bites   History of tick bite Reported history of tick bite. Concern for CNS encephalitis in the setting of tickborne illness - appears to have been ruled out ID followed initially No longer on Doxy.  Disposition Increasing UE tone, rigidity Overall, appears to have a very poor prognosis Family seems to be aware and understand  They are receptive to palliative  care consultation, which happened initially on 7/19 Family is moving towards comfort care sequentially Patient is now DNR No longer receiving acute PT/OT therapies Family is continuing to encourage PO  intake    Consultants: Neurology ID Palliative care PT/OT SLP [facility] team   Procedures: EEG 7/12 LTM EEG 7/13, 7/18 Cortrak - placed and nearly immediately removed by patient on 7/17 LP (by IR at [facility] on 7/12, PTA)     Subjective: Periodic follows directions/commands - none today on my evaluation.  Able to stand/walk with assistance.  Worsening bladder control today, having episodes of incontinence.  Family prefers Depends over a foley at this time.   Objective: Vitals:   11/11/22 0753 11/11/22 1059  BP: (!) 131/93 (!) 124/96  Pulse: 89 97  Resp: 16 20  Temp: 99 F (37.2 C) 98.7 F (37.1 C)  SpO2: 98% (!) 89%    Intake/Output Summary (Last 24 hours) at 11/11/2022 1339 Last data filed at 11/10/2022 1923 Gross per 24 hour  Intake --  Output 150 ml  Net -150 ml   Filed Weights   11/09/22 0325 11/10/22 0700  Weight: 66 kg 66 kg    Exam:  General:  Awake, no attempt to engage, pleasant but did not follow commands today  Eyes:   closed throughout today ENT:  grossly normal lips & tongue, mmm Neck:  no LAD, masses or thyromegaly Cardiovascular:  RRR. No LE edema.  Respiratory:   CTA bilaterally with no wheezes/rales/rhonchi.  Normal respiratory effort. Abdomen:  soft, NT, ND Skin:  no rash or induration seen on limited exam; has picked off all but 2 of her artificial fingernails Musculoskeletal:  increasing hypertonicity of BUE > BLE with increasing rigidity, strength diffusely 4/5 Psychiatric:  no attempt at interaction/engagement today and did not follow commands Neurologic:  increasing UE tone/rigidity; RUE is contracting but she is still able to move it and follows commands     Family Communication: Husband, brother, SIL, niece were present throughout evaluation  Disposition: Status is: Inpatient Remains inpatient appropriate because: critical illness  Planned Discharge Destination:  TBD    Time spent: 50 minutes  Author: Jonah Blue,  MD 11/11/2022 1:39 PM  For on call review [email or web address].

## 2022-11-11 NOTE — Progress Notes (Signed)
Palliative Medicine Inpatient Follow Up Note HPI: 57yo with PMH of depression, anxiety, DUB presented on 7/10 to [facility] with abrupt onset 3 weeks ago of progressive and precipitous cognitive decline, with first symptom being short term memory impairment, forgetting key details of her daughter's stated plans for a trip multiple times, manifested as forgetting over time frames of a few hours despite repeatedly being informed/corrected regarding the information she was not retaining. A few days later, her behavior started to become altered and somewhat inappropriate. Neurology and ID were consulted. Being worked up to rule of CJD, paraneoplastic syndrome,  autoimmune encephalitis, arbovirus, lyme disease, and/or RMSF.    Palliative care has been consulted in the setting of this very difficult situation to support family and help discern goals of care if improvement neglect to be seen.   Today's Discussion 11/11/2022  *Please note that this is a verbal dictation therefore any spelling or grammatical errors are due to the "[employer]agon [employer] One" system interpretation.  Chart reviewed inclusive of vital signs, progress notes, laboratory results, and diagnostic images.   I met with Jody Nolan's RN, Alan Ripper this morning. She shares that patient had some incontinence episodes and family plan to bring pull ups. Otherwise does not note anything new.   Upon assessment this morning Jody Nolan is awake though not verbal. She is pleasant and smiling. She does not consistently follow commands for me.   I met with patients spouse, Fayrene Fearing who shares that he had spent the night. He vocalizes that he is glad his children were able to see and spend time with Jody Nolan yesterday as it was also their birthday. Fayrene Fearing shares that they are relying on one another during this difficult time. Fayrene Fearing is asking about the second opinion and when this may happen. I shared that I would speak to neurology who could give [place] a better idea.  Plan  at this time is to wait for results for clinical lab(s) for further decision making.   Questions and concerns addressed/Palliative Support Provided.   Objective Assessment: Vital Signs Vitals:   11/11/22 0753 11/11/22 1059  BP: (!) 131/93 (!) 124/96  Pulse: 89 97  Resp: 16 20  Temp: 99 F (37.2 C) 98.7 F (37.1 C)  SpO2: 98% (!) 89%    Intake/Output Summary (Last 24 hours) at 11/11/2022 1359 Last data filed at 11/10/2022 1923 Gross per 24 hour  Intake --  Output 150 ml  Net -150 ml   Last Weight  Most recent update: 11/10/2022  8:06 AM    Weight  66 kg (145 lb 8.1 oz)            Gen:  Middle aged woman in NAD HEENT: moist mucous membranes CV: Regular rate and rhythm  PULM:  On RA, breathing is even and nonlabored ABD: soft/nontender  EXT: No edema  Neuro: Disoriented  SUMMARY OF RECOMMENDATIONS   DNAR/DNI   Patients family are interested in a second opinion from neurology - Have made the Neurology team aware   Awaiting results from [facility] Prior Lab for diagnosis confirmation   Patients family are aware in regards to patients possible poor prognosis   PMT will continue to support patient and her family  Billing based on MDM: Moderate ______________________________________________________________________________________ Lamarr Lulas [place] Palliative Medicine Team Team Cell Phone: [phone] Please utilize secure chat with additional questions, if there is no response within 30 minutes please call the above phone number  Palliative Medicine Team providers are available by phone from 7am to  7pm daily and can be reached through the team cell phone.  Should this patient require assistance outside of these hours, please call the patient's attending physician.

## 2022-11-11 NOTE — Progress Notes (Signed)
Neurology Progress Note  Brief HPI: 57 year old female with relatively abrupt onset 3 weeks ago of progressive and precipitous cognitive decline, with first symptom being short term memory impairment, forgetting key details of her daughter's stated plans for a trip multiple times, manifested as forgetting over time frames of a few hours despite repeatedly being informed/corrected regarding the information she was not retaining. A few days later, her behavior started to become altered and somewhat inappropriate. She had complained of a recent tick bite without an associate rash and was prescribed with Doxycycline for a presumed tick-borne illness at [facility] at some point. Symptoms progressed to severe confusion requiring admission to the hospital in [facility], near her residence there. She was determined to be safe for discharge after some testing that did not yield a diagnosis. Family then drove her up to [place] to be watched closely and cared for by them, as she seemed to be precipitously losing her ability to manage her own affairs. After further decline, they brought her to [facility] to be evaluated. Work up there included LP as well as MRI brain, which revealed multifocal cortical ribboning on DWI and T2, as well as abnormal basal ganglia signal changes, but no abnormal enhancement.   Again, symptoms of progressively worsening confusion and altered mental status are thought to have been going on for about 3 weeks. Possibilities include autoimmune or infectious encephalitis, though a bacterial or one of the more common viral infectious etiologies are less likely given no fever and negative meningitis/encephalitis CSF PCR panel. Exam reveals a severely abulic patient without myoclonus or tremor. Tone is normal, with no waxy rigidity noted. CSF labs negative for infection so far. There is mild nonspecific elevation of CSF WBCs. CSF Protein and glucose are normal. EEG with triphasic waves.    Subjective: Patient seen in room with her husband at the bedside.  She is more interactive with family members today, is able to recognize family member states "oh my goodness" smiles and hugs family members.   Beta Isoform, RT-QuIC and tau protein have been ordered as a send-out panel to the [facility] at [facility] from the remaining CSF at [facility] to assess for possible CJD. Spoke with lab at [facility] and it is being sent. Pathology and Infection Prevention notified as well.  Exam: Vitals:   11/11/22 0325 11/11/22 0753  BP: 121/77 (!) 131/93  Pulse: 85 89  Resp:  16  Temp: 98.9 F (37.2 C) 99 F (37.2 C)  SpO2: 97% 98%   Gen: In bed, NAD Resp: non-labored breathing, no acute distress Abd: soft, nt   Neuro: Mental Status: Awake with eyes open but minimally interactive.  Does not track examiner Cranial Nerves:  II: PERRL. Visual fields full to confrontation.  III, IV, VI: EOM intact. Eyelids elevate symmetrically.  VII: no facial asymmetry   VIII: hearing intact to voice Motor:  RUE: 4/5         LUE: 4/5 RLE 4/5           LLE: 4/5  Tone increased in upper extremities and bulk is normal Rigidity noted with elevating extremities No myoclonus or tremor noted.  Sensation- Intact to light touch bilaterally Coordination: Unable to perform Gait- Deferred     Pertinent Labs: Electrolytes normal CBC normal RPR negative UDS negative EtOH and salicylate: Negative Glucose 98 CRP - 0.5  Sed rate- 10 TSH- 2.862 T4, free- 1.04 Thyroglobulin antibody negative Thyroid peroxidase 26 Vitamin B1 - 244  Vitamin B12 normal ANA w/ reflex pending ANCA - <0.2 Heavy metals - negative Lime disease serology negative HIV- negative Cryptococcal antigen- negative  CSF labs resulted:             - Clear and colorless - Meningitis/encephalitis panel- negative - Protein 41 - Glucose 70 - CSF WBC- mildly elevated at 17 - Cryptococcal antigen- negative -  Autoimmune encephalitis Ab negative -Pathologist smear review of CSF was negative for malignancy with monocytes, lymphocytes and rare neutrophils - [place] IgG- Negative - [place] IgM- Negative -IgG index albumin slightly elevated at 43 -CSF VDRL negative  -Oligoclonal bands negative  -Ehrlichia negative CSF labs pending:  Arbovirus IgG and IgM Paraneoplastic Ab  Autoimmune encephalitis anti NMDA receptor Beta isoform pending RT-quic pending Tile protein pending  Imaging Reviewed: CTA head and neck  1. Patent vasculature of the head and neck without hemodynamically significant or occlusion. No evidence of vasospasm or RCVS in the intracranial vasculature. 2. 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  3. 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   MRI brain Widespread, bilateral abnormal cortical diffusion with ribboning and correlating FLAIR signal in a nonspecific pattern. There is also asymmetric caudate nucleus involvement also on the left. But white matter seems spared, there is no associated abnormal enhancement, and no intracranial mass effect. Broad differential diagnosis at this point and recommend Neurology consultation with consideration of: Infectious Meningoencephalitis (although lack of enhancement argues against), Reversible cerebral vasoconstriction (RCVS), Urea cycle disorders/Uremia, hypoglycemia, Seizure disorder, PRES (although sparing of white matter argues against), Hepatic encephalopathy, Mitochondrial disorder, and Creutzfeldt-jakob disease.  Repeat MRI brain 7/17 IMPRESSION: Extensive confluent areas of cerebral cortical and caudate nuclei T2 hyperintensity with associated restricted diffusion without mass effect or contrast enhancement. 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 Profound hypoxic  injury could have similar appearance.   LTM EEG 11/04/2022 0202 to 11/04/2022 0915  ABNORMALITY - Periodic discharges with triphasic morphology, generalized ( GPDs) - Continuous slow, generalized  IMPRESSION: This study showed generalized periodic discharges with triphasic morphology which can be on the ictal-interictal continuum. However, the morphology, frequency and reactivity to stimulation is more commonly suggestive of toxic-metabolic causes. Additionally there is moderate diffuse encephalopathy. No seizures were seen throughout the recording.  LTM EEG 11/05/2022 0202 to 11/05/2022 0956  ABNORMALITY - Periodic discharges with triphasic morphology, generalized  and lateralized left hemisphere - Continuous slow, generalized  IMPRESSION: This study showed periodic discharges with triphasic morphology which now appears more predominantly in left hemisphere. This pattern is on the ictal-interictal continuum specifically the lateralized nature of the discharges which can suggest underlying cortical abnormality. Creutzfeldt-Jakob disease (CJD) can have similar EEG appearance although EEG is not diagnostic. Additionally there is moderate diffuse encephalopathy. No definite seizures were seen throughout the recording.  LTM EEG 7/15 Periodic discharges with triphasic morphology, generalized and lateralized left hemisphere  LTM EEG 7/18 Periodic discharges with triphasic morphology, generalized and lateralized left hemisphere, continuous slow generalized  LTM EEG 7/19 Periodic discharges with triphasic morphology appearing more predominantly in the left hemisphere  Assessment: 57 year old female with relatively abrupt onset severe confusion with cortical ribboning on MRI. Symptoms of rapidly and progressively worsening confusion and altered mental status are endorsed by family as having been going on for about 3 weeks. Possibilities include encephalitis, autoimmune or infectious, though a bacterial  or viral infectious etiology is less  likely given no fever and negative meningitis/encephalitis CSF PCR panel.  - Exam reveals a severely abulic patient without myoclonus or tremor. Tone is increased. Rigidity noted in upper and lower extremities - CSF labs do not militate in favor of infection. There is mild nonspecific elevation of CSF WBCs. Protein and glucose are normal.  - EEG shows no electrographic seizures to suggest an epileptic etiology for her brain lesions. However, the triphasic waves are suggestive of possible CJD. Per the literature, typical EEG findings in CJD include bi-anterior dominant periodic triphasic sharp wave complexes, lasting 600-1085ms, occuring at a rate of 0.5 to 2Hz .  - MRI findings as documented above include multifocal cortical ribboning and anterior basal ganglia diffuse hyperintensity.   - Acute thiamine deficiency is unlikely, but will treat with empiric thiamine -Empiric steroids started 7/15 after discussion with ID, but patient has shown no response with steroid treatment - DDx:  - CJD  - Paraneoplastic syndrome - Autoimmune encephalitis such as a rare imaging presentation of ADEM, would be worthwhile to start high-dose steroids if infectious disease service has no objection to this - Arbovirus or other CNS-trophic virus not detectable on CSF PCR panel is also possible, and is felt to be an intermediate possibility based on literature review of cases including MRI imaging.  - The imaging findings are not typical for Lyme disease, which tends more to affect the white matter.  - Imaging also not very consistent with RMSF, which tends to show multiple much smaller lesions in the white matter (aka "starry sky" pattern), rather than the grey matter involvement seen on MRI of our patient.      Recommendations: - Continue high-dose empiric thiamine - Continue Keppra 1 g twice daily after 2 g load - RT-QuIC and 14-3-3 ordered along with tau as a send out to the  [facility] at [facility]. The approximate current sensitivity of CSF RT-QuIC is 92% and the specificity is 100%.  - No specific isolation precautions for suspected CJD specific to [place] can be found on literature search. Per [facility] guidelines, there is no evidence that normal social or routine contact with a CJD patient represents a risk for healthcare workers, household members, or others. Isolation of patients with CJD is not necessary for routine examinations, etc. However, IF there are any interventions, equipment has to be sequestered (or all disposable) until a diagnosis is found. .  - CT of chest/abdomen/pelvis with contrast to assess for possible malignancy has been negative and non revealing. - Has been seen by ID at [facility], seen and evaluated by ID team at [facility]. Arbovirus panel ordered and pending. - empiric solumedrol daily x 5 doses with some ?improvement after 3rd day. Thou not a robust response and could very well be day to day fluctuations in exam. -May consider PLEX. However with negative autoimmune encephalopath panel, IgG index and OCB not suggestive of Csf autoantibody production, unclear if this truly would be helpful. Will do 2 additional days of IV solumedrol and then re-evaluate for PLEX.   Patient seen and examined by NP/APP with MD. MD to update note as needed.   Cortney E Ernestina Columbia , MSN, AGACNP-BC [facility] See Amion for schedule and pager information 11/11/2022 10:27 AM  NEUROHOSPITALIST ADDENDUM Performed a face to face diagnostic evaluation.   I have reviewed the contents of history and physical exam as documented by PA/ARNP/Resident and agree with above documentation.  I have discussed and formulated the above  plan as documented. Edits to the note have been made as needed.  Erick Blinks, MD [facility] [phone]   If 7pm to 7am, please call on call as listed on AMION.

## 2022-11-11 NOTE — Progress Notes (Signed)
Peripherally Inserted Central Catheter Placement  The IV Nurse has discussed with the patient and/or persons authorized to consent for the patient, the purpose of this procedure and the potential benefits and risks involved with this procedure.  The benefits include less needle sticks, lab draws from the catheter, and the patient may be discharged home with the catheter. Risks include, but not limited to, infection, bleeding, blood clot (thrombus formation), and puncture of an artery; nerve damage and irregular heartbeat and possibility to perform a PICC exchange if needed/ordered by physician.  Alternatives to this procedure were also discussed.  Bard Power PICC patient education guide, fact sheet on infection prevention and patient information card has been provided to patient /or left at bedside.  Husband and children at bedside gave consent for PICC placement.  PICC Placement Documentation  PICC Double Lumen 11/11/22 Right Basilic 36 cm 0 cm (Active)  Indication for Insertion or Continuance of Line Prolonged intravenous therapies 11/11/22 1604  Exposed Catheter (cm) 0 cm 11/11/22 1604  Site Assessment Clean, Dry, Intact 11/11/22 1604  Lumen #1 Status Flushed;Saline locked;Blood return noted 11/11/22 1604  Lumen #2 Status Flushed;Saline locked;Blood return noted 11/11/22 1604  Dressing Type Transparent;Securing device 11/11/22 1604  Dressing Status Antimicrobial disc in place;Clean, Dry, Intact 11/11/22 1604  Safety Lock Not Applicable 11/11/22 1604  Line Care Connections checked and tightened 11/11/22 1604  Line Adjustment (NICU/IV Team Only) No 11/11/22 1604  Dressing Intervention New dressing 11/11/22 1604  Dressing Change Due 11/18/22 11/11/22 1604       Elliot Dally 11/11/2022, 4:06 PM

## 2022-11-11 NOTE — Plan of Care (Signed)

## 2022-11-12 NOTE — Progress Notes (Signed)
Progress Note   Patient: Jody Nolan UEA:[id number] DOB: 10/14/1965 DOA: 11/03/2022     9 DOS: the patient was seen and examined on 11/12/2022    Brief hospital course: 57yo with PMH of depression, anxiety, DUB presented on 7/10 to [facility] with complaints of confusion.  Neurology and ID were consulted. Underwent further workup including LP, MRI brain, and EEG.  MRI brain showed lateral abnormal cortical diffusion and FLAIR signal and EEG showed triphasic waves as well as severe encephalopathy.  Patient was started on IV Keppra and was sent to [facility] for long-term EEG monitoring.  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  She has not responded to steroids, due to complete them on 7/20 and family wishes to complete the 5 days.  Family at this time wants to wait to transition to comfort measures until someone can tell them definitively that this is a terminal condition - which might mean waiting for the CJD testing. They are uncertain about replacing the Cortrak and will hold on this through the weekend in the hopes that she can continue to take some PO intake. She does seem to know what bothers her - and the Cortrak is one of those things - so they don't think she is having pain and do not want a narcotic drip.  As the diagnosis becomes more apparent, more family is coming to town to spend time with her.  PICC was placed on 7/20.  Palliative care is consulting.  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 Repeating LTM EEG Repeat MRI on 7/17 with extensive cortical hyperintensity with associated restricted diffusion, most c/w CJD Appreciate neurology follow-up. Workup including RPR, HIV, TSH, B12  unremarkable. B1 mildly elevated. Thyroid antibodies unremarkable. ANA negative Autoimmune encephalitis panel negative heavy metal screen negative.   Lyme serology negative. Started on IV doxycycline but now off.  ID followed but has signed off. Started on IV Keppra. Started on IV steroids on 7/15 x 5 days Palliative care is consulting [facility] neurologists will have seen patient and rendered an opinion by Monday, 7/22 Awaiting definitive testing result for CJD  History of anxiety and depression Not on any medication. Currently undergoing significant stress in her life unsure if that is contributing to her acute worsening but this is increasingly unlikely to be the cause.  Unintentional weight loss Reported history.  Unclear about exact extent of weight loss. Could be related to mood disorder. Monitor for now. Receiving thiamine therapy for possible encephalopathy related to that Nutrition Problem: Severe Malnutrition Etiology: acute illness (acute encephalopathy) Signs/Symptoms: energy intake < or equal to 50% for > or equal to 5 days, mild fat depletion, moderate muscle depletion, percent weight loss (11% weight loss in 6 months) Percent weight loss: 11 % Interventions: Tube feeding, MVI Pulled out Cortrak within hours of placement, clearly agitated by it She is able to tolerate floor stock liquids and puree/pudding solids with slow, small sips/bites   History of tick bite Reported history of tick bite. Concern for CNS encephalitis in the setting of tickborne illness - appears to have been ruled out ID followed initially No longer on Doxy.  Disposition Increasing UE tone, rigidity Overall, appears to have a very poor prognosis Family seems  to be aware and understand  They are receptive to palliative care consultation, which happened initially on 7/19 Family is moving towards comfort care sequentially Patient is now DNR No longer receiving acute PT/OT therapies Family is  continuing to encourage PO intake    Consultants: Neurology ID Palliative care PT/OT SLP [facility] team   Procedures: EEG 7/12 LTM EEG 7/13, 7/18 Cortrak - placed and nearly immediately removed by patient on 7/17 LP (by IR at [facility] on 7/12, PTA)     Subjective: Less interactive today, according to family. She did smile at me and waved goodbye when I left.  She also made a funny face at her brother and appeared to laugh.  Less consistently following commands today.   Objective: Vitals:   11/11/22 2313 11/12/22 0254  BP: (!) 142/94 (!) 139/98  Pulse: 89 94  Resp: 20 20  Temp:  98.1 F (36.7 C)  SpO2: 96% 97%    Intake/Output Summary (Last 24 hours) at 11/12/2022 0744 Last data filed at 11/12/2022 0539 Gross per 24 hour  Intake 4888.62 ml  Output --  Net 4888.62 ml   Filed Weights   11/09/22 0325 11/10/22 0700  Weight: 66 kg 66 kg    Exam:  General:  Awake, no attempt to engage, pleasant but did not follow commands today other than squeezing L hand lightly Eyes:   clear conjunctivae ENT:  grossly normal lips & tongue, mmm Neck:  no LAD, masses or thyromegaly Cardiovascular:  RRR. No LE edema.  Respiratory:   CTA bilaterally with no wheezes/rales/rhonchi.  Normal respiratory effort. Abdomen:  soft, NT, ND Skin:  no rash or induration seen on limited exam; has picked off all but 2 of her artificial fingernails Musculoskeletal:  increasing hypertonicity of BUE > BLE with increasing rigidity, only attempted to weakly squeeze with left hand today Psychiatric:  Mostly did not follow commands or engage but she did smile upon my entrance and wave upon my exit Neurologic:  increasing UE tone/rigidity; both arms were extended next to her head today (appears to be posturing some)   Data Reviewed: I have reviewed the patient's lab results since admission.  Pertinent labs for today include:  None   Family Communication: Mother, brother were present during today's  encounter  Disposition: Status is: Inpatient Remains inpatient appropriate because: critically ill  Planned Discharge Destination:  TBD    Time spent: 35 minutes  Author: Jonah Blue, MD 11/12/2022 7:44 AM  For on call review [email or web address].

## 2022-11-12 NOTE — Plan of Care (Signed)

## 2022-11-12 NOTE — Plan of Care (Signed)

## 2022-11-12 NOTE — Plan of Care (Signed)
  Problem: Education: Goal: Knowledge of General Education information will improve Description: Including pain rating scale, medication(s)/side effects and non-pharmacologic comfort measures Outcome: Not Progressing   Problem: Health Behavior/Discharge Planning: Goal: Ability to manage health-related needs will improve Outcome: Not Progressing   Problem: Clinical Measurements: Goal: Ability to maintain clinical measurements within normal limits will improve Outcome: Progressing Goal: Will remain free from infection Outcome: Progressing Goal: Diagnostic test results will improve Outcome: Progressing Goal: Respiratory complications will improve Outcome: Progressing Goal: Cardiovascular complication will be avoided Outcome: Progressing   Problem: Nutrition: Goal: Adequate nutrition will be maintained Outcome: Not Progressing   Problem: Coping: Goal: Level of anxiety will decrease Outcome: Not Progressing

## 2022-11-12 NOTE — Progress Notes (Signed)
Neurology Progress Note  Brief HPI: 57 year old female with relatively abrupt onset 3 weeks ago of progressive and precipitous cognitive decline, with first symptom being short term memory impairment, forgetting key details of her daughter's stated plans for a trip multiple times, manifested as forgetting over time frames of a few hours despite repeatedly being informed/corrected regarding the information she was not retaining. A few days later, her behavior started to become altered and somewhat inappropriate. She had complained of a recent tick bite without an associate rash and was prescribed with Doxycycline for a presumed tick-borne illness at [facility] at some point. Symptoms progressed to severe confusion requiring admission to the hospital in [facility], near her residence there. She was determined to be safe for discharge after some testing that did not yield a diagnosis. Family then drove her up to [place] to be watched closely and cared for by them, as she seemed to be precipitously losing her ability to manage her own affairs. After further decline, they brought her to [facility] to be evaluated. Work up there included LP as well as MRI brain, which revealed multifocal cortical ribboning on DWI and T2, as well as abnormal basal ganglia signal changes, but no abnormal enhancement.   Again, symptoms of progressively worsening confusion and altered mental status are thought to have been going on for about 3 weeks. Possibilities include autoimmune or infectious encephalitis, though a bacterial or one of the more common viral infectious etiologies are less likely given no fever and negative meningitis/encephalitis CSF PCR panel. Exam reveals a severely abulic patient without myoclonus or tremor. Tone is normal, with no waxy rigidity noted. CSF labs negative for infection so far. There is mild nonspecific elevation of CSF WBCs. CSF Protein and glucose are normal. EEG with triphasic waves.    Subjective: Patient seen in room with her mother at the bedside.  She smiles and waves when examiner enters the room.   Beta Isoform, RT-QuIC and tau protein have been ordered as a send-out panel to the [facility] at [facility] from the remaining CSF at [facility] to assess for possible CJD. Spoke with lab at [facility] and it is being sent. Pathology and Infection Prevention notified as well.  Exam: Vitals:   11/11/22 2313 11/12/22 0254  BP: (!) 142/94 (!) 139/98  Pulse: 89 94  Resp: 20 20  Temp:  98.1 F (36.7 C)  SpO2: 96% 97%   Gen: In bed, NAD Resp: non-labored breathing, no acute distress Abd: soft, nt   Neuro: Mental Status: Awake with eyes open but minimally interactive.  Smiles and waves and tracks examiner around the room but is unable to follow commands or mimic today Cranial Nerves:  II: PERRL. Visual fields full to confrontation.  III, IV, VI: EOM intact. Eyelids elevate symmetrically.  VII: no facial asymmetry   VIII: hearing intact to voice Motor:  Moves all four extremities spontaneously Tone increased in upper extremities and bulk is normal No myoclonus or tremor noted.  Sensation- Intact to light touch bilaterally Coordination: Unable to perform Gait- Deferred     Pertinent Labs: Electrolytes normal CBC normal RPR negative UDS negative EtOH and salicylate: Negative Glucose 98 CRP - 0.5  Sed rate- 10 TSH- 2.862 T4, free- 1.04 Thyroglobulin antibody negative Thyroid peroxidase 26 Vitamin B1 - 244 Vitamin B12 normal ANA w/ reflex pending ANCA - <0.2 Heavy metals - negative Lime disease serology negative HIV- negative Cryptococcal antigen- negative  CSF labs resulted:             -  Clear and colorless - Meningitis/encephalitis panel- negative - Protein 41 - Glucose 70 - CSF WBC- mildly elevated at 17 - Cryptococcal antigen- negative - Autoimmune encephalitis Ab negative -Pathologist smear review of CSF was negative  for malignancy with monocytes, lymphocytes and rare neutrophils - [place] IgG- Negative - [place] IgM- Negative -IgG index albumin slightly elevated at 43 -CSF VDRL negative  -Oligoclonal bands negative  -Ehrlichia negative CSF labs pending:  Arbovirus IgG and IgM Paraneoplastic Ab  Autoimmune encephalitis anti NMDA receptor Beta isoform pending RT-quic pending Tile protein pending  Imaging Reviewed: CTA head and neck  1. Patent vasculature of the head and neck without hemodynamically significant or occlusion. No evidence of vasospasm or RCVS in the intracranial vasculature. 2. 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  3. 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   MRI brain Widespread, bilateral abnormal cortical diffusion with ribboning and correlating FLAIR signal in a nonspecific pattern. There is also asymmetric caudate nucleus involvement also on the left. But white matter seems spared, there is no associated abnormal enhancement, and no intracranial mass effect. Broad differential diagnosis at this point and recommend Neurology consultation with consideration of: Infectious Meningoencephalitis (although lack of enhancement argues against), Reversible cerebral vasoconstriction (RCVS), Urea cycle disorders/Uremia, hypoglycemia, Seizure disorder, PRES (although sparing of white matter argues against), Hepatic encephalopathy, Mitochondrial disorder, and Creutzfeldt-jakob disease.  Repeat MRI brain 7/17 IMPRESSION: Extensive confluent areas of cerebral cortical and caudate nuclei T2 hyperintensity with associated restricted diffusion without mass effect or contrast enhancement. 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 Profound hypoxic injury could have similar appearance.   LTM EEG 11/04/2022 0202 to 11/04/2022  0915  ABNORMALITY - Periodic discharges with triphasic morphology, generalized ( GPDs) - Continuous slow, generalized  IMPRESSION: This study showed generalized periodic discharges with triphasic morphology which can be on the ictal-interictal continuum. However, the morphology, frequency and reactivity to stimulation is more commonly suggestive of toxic-metabolic causes. Additionally there is moderate diffuse encephalopathy. No seizures were seen throughout the recording.  LTM EEG 11/05/2022 0202 to 11/05/2022 0956  ABNORMALITY - Periodic discharges with triphasic morphology, generalized  and lateralized left hemisphere - Continuous slow, generalized  IMPRESSION: This study showed periodic discharges with triphasic morphology which now appears more predominantly in left hemisphere. This pattern is on the ictal-interictal continuum specifically the lateralized nature of the discharges which can suggest underlying cortical abnormality. Creutzfeldt-Jakob disease (CJD) can have similar EEG appearance although EEG is not diagnostic. Additionally there is moderate diffuse encephalopathy. No definite seizures were seen throughout the recording.  LTM EEG 7/15 Periodic discharges with triphasic morphology, generalized and lateralized left hemisphere  LTM EEG 7/18 Periodic discharges with triphasic morphology, generalized and lateralized left hemisphere, continuous slow generalized  LTM EEG 7/19 Periodic discharges with triphasic morphology appearing more predominantly in the left hemisphere  Assessment: 57 year old female with relatively abrupt onset severe confusion with cortical ribboning on MRI. Symptoms of rapidly and progressively worsening confusion and altered mental status are endorsed by family as having been going on for about 3 weeks. Possibilities include encephalitis, autoimmune or infectious, though a bacterial or viral infectious etiology is less likely given no fever and negative  meningitis/encephalitis CSF PCR panel.  - Exam reveals a severely abulic patient without myoclonus or tremor. Tone is increased. Rigidity noted in upper and lower extremities - CSF labs do not militate in favor  of infection. There is mild nonspecific elevation of CSF WBCs. Protein and glucose are normal.  - EEG shows no electrographic seizures to suggest an epileptic etiology for her brain lesions. However, the triphasic waves are suggestive of possible CJD. Per the literature, typical EEG findings in CJD include bi-anterior dominant periodic triphasic sharp wave complexes, lasting 600-1054ms, occuring at a rate of 0.5 to 2Hz .  - MRI findings as documented above include multifocal cortical ribboning and anterior basal ganglia diffuse hyperintensity.   - Acute thiamine deficiency is unlikely, but will treat with empiric thiamine -Empiric steroids started 7/15 after discussion with ID, but patient has shown no response with steroid treatment - DDx:  - CJD  - Paraneoplastic syndrome - Autoimmune encephalitis such as a rare imaging presentation of ADEM, would be worthwhile to start high-dose steroids if infectious disease service has no objection to this - Arbovirus or other CNS-trophic virus not detectable on CSF PCR panel is also possible, and is felt to be an intermediate possibility based on literature review of cases including MRI imaging.  - The imaging findings are not typical for Lyme disease, which tends more to affect the white matter.  - Imaging also not very consistent with RMSF, which tends to show multiple much smaller lesions in the white matter (aka "starry sky" pattern), rather than the grey matter involvement seen on MRI of our patient.      Recommendations: - Continue high-dose empiric thiamine - Continue Keppra 1 g twice daily after 2 g load - RT-QuIC and 14-3-3 ordered along with tau as a send out to the [facility] at [facility]. The approximate  current sensitivity of CSF RT-QuIC is 92% and the specificity is 100%.  - No specific isolation precautions for suspected CJD specific to [place] can be found on literature search. Per [facility] guidelines, there is no evidence that normal social or routine contact with a CJD patient represents a risk for healthcare workers, household members, or others. Isolation of patients with CJD is not necessary for routine examinations, etc. However, IF there are any interventions, equipment has to be sequestered (or all disposable) until a diagnosis is found. .  - CT of chest/abdomen/pelvis with contrast to assess for possible malignancy has been negative and non revealing. - Has been seen by ID at [facility], seen and evaluated by ID team at [facility]. Arbovirus panel ordered and pending. - empiric solumedrol daily x 5 doses with some ?improvement after 3rd day. Thou not a robust response and could very well be day to day fluctuations in exam. -May consider PLEX. However with negative autoimmune encephalopath panel, IgG index and OCB not suggestive of Csf autoantibody production, unclear if this truly would be helpful. Will do 2 additional days of IV solumedrol and then re-evaluate for PLEX.   Patient seen and examined by NP/APP with MD. MD to update note as needed.   Cortney E Ernestina Columbia , MSN, AGACNP-BC [facility] See Amion for schedule and pager information 11/12/2022 8:34 AM  NEUROHOSPITALIST ADDENDUM Performed a face to face diagnostic evaluation.   I have reviewed the contents of history and physical exam as documented by PA/ARNP/Resident and agree with above documentation.  I have discussed and formulated the above plan as documented. Edits to the note have been made as needed.  Impression/Key exam findings/Plan: family feels she has declined a litte in the afternoon today. CJD testing is still pending. Given labs not suggestive of an autoimmune process,  will hold off on  PLEX.  Discussed with husband and patient's children. Dr. Wilford Corner taking over for me tomorrow and family looking forward to a second opinion from him.  Erick Blinks, MD [facility] [phone]   If 7pm to 7am, please call on call as listed on AMION.

## 2022-11-12 NOTE — Progress Notes (Signed)
   Palliative Medicine Inpatient Follow Up Note HPI: 57yo with PMH of depression, anxiety, DUB presented on 7/10 to [facility] with abrupt onset 3 weeks ago of progressive and precipitous cognitive decline, with first symptom being short term memory impairment, forgetting key details of her daughter's stated plans for a trip multiple times, manifested as forgetting over time frames of a few hours despite repeatedly being informed/corrected regarding the information she was not retaining. A few days later, her behavior started to become altered and somewhat inappropriate. Neurology and ID were consulted. Being worked up to rule of CJD, paraneoplastic syndrome,  autoimmune encephalitis, arbovirus, lyme disease, and/or RMSF.    Palliative care has been consulted in the setting of this very difficult situation to support family and help discern goals of care if improvement neglect to be seen.   Today's Discussion 11/12/2022  *Please note that this is a verbal dictation therefore any spelling or grammatical errors are due to the "Dragon Medical One" system interpretation.  Chart reviewed inclusive of vital signs, progress notes, laboratory results, and diagnostic images.   I met with Jolonda, her mother, [name] and her brother this morning.   Per Shanda Bumps is less interactive this morning. She had not slept well overnight.   Lakaisha opens her eyes to me this morning though is not interactive. She does not appear to be in distress.   Offered time and support to patients mother and brother given the difficulty of Delonna's clinical state and possible prognosis.   Plan at this time is to wait for results for clinical lab(s) for further decision making.   Questions and concerns addressed/Palliative Support Provided.   Objective Assessment: Vital Signs Vitals:   11/12/22 0254 11/12/22 0805  BP: (!) 139/98 126/84  Pulse: 94 88  Resp: 20   Temp: 98.1 F (36.7 C) 98.2 F (36.8 C)  SpO2: 97% 95%     Intake/Output Summary (Last 24 hours) at 11/12/2022 1130 Last data filed at 11/12/2022 0539 Gross per 24 hour  Intake 4888.62 ml  Output --  Net 4888.62 ml   Last Weight  Most recent update: 11/10/2022  8:06 AM    Weight  66 kg (145 lb 8.1 oz)            Gen:  Middle aged woman in NAD HEENT: moist mucous membranes CV: Regular rate and rhythm  PULM:  On RA, breathing is even and nonlabored ABD: soft/nontender  EXT: No edema  Neuro: Disoriented  SUMMARY OF RECOMMENDATIONS   DNAR/DNI   Patients family are interested in a second opinion from neurology - Have made the Neurology team aware and this should occur tomorrow   Awaiting results from [facility] for diagnosis confirmation --> Sent on 7/14 (takes 2-3 weeks)   Patients family are aware in regards to patients possible poor prognosis   PMT will continue to support patient and her family  Billing based on MDM: Moderate ______________________________________________________________________________________ Lamarr Lulas  Palliative Medicine Team Team Cell Phone: [phone] Please utilize secure chat with additional questions, if there is no response within 30 minutes please call the above phone number  Palliative Medicine Team providers are available by phone from 7am to 7pm daily and can be reached through the team cell phone.  Should this patient require assistance outside of these hours, please call the patient's attending physician.

## 2022-11-13 LAB — BASIC METABOLIC PANEL
Anion gap: 12 (ref 5–15)
BUN: 16 mg/dL (ref 6–20)
CO2: 22 mmol/L (ref 22–32)
Calcium: 9.1 mg/dL (ref 8.9–10.3)
Chloride: 103 mmol/L (ref 98–111)
Creatinine, Ser: 0.76 mg/dL (ref 0.44–1.00)
GFR, Estimated: >60 mL/min (ref >60)
Glucose, Bld: 210 mg/dL — ABNORMAL HIGH (ref 70–99)
Potassium: 3.9 mmol/L (ref 3.5–5.1)
Sodium: 137 mmol/L (ref 135–145)

## 2022-11-13 LAB — MISC LABCORP TEST (SEND OUT): Labcorp test code: 9985

## 2022-11-13 LAB — CBC WITH DIFFERENTIAL/PLATELET
Abs Immature Granulocytes: 0.04 10*3/uL (ref 0.00–0.07)
Basophils Absolute: 0 10*3/uL (ref 0.0–0.1)
Basophils Relative: 0 %
Eosinophils Absolute: 0 10*3/uL (ref 0.0–0.5)
Eosinophils Relative: 0 %
HCT: 46.4 % — ABNORMAL HIGH (ref 36.0–46.0)
Hemoglobin: 15.3 g/dL — ABNORMAL HIGH (ref 12.0–15.0)
Immature Granulocytes: 1 %
Lymphocytes Relative: 5 %
Lymphs Abs: 0.3 10*3/uL — ABNORMAL LOW (ref 0.7–4.0)
MCH: 29.4 pg (ref 26.0–34.0)
MCHC: 33 g/dL (ref 30.0–36.0)
MCV: 89.2 fL (ref 80.0–100.0)
Monocytes Absolute: 0.1 10*3/uL (ref 0.1–1.0)
Monocytes Relative: 1 %
Neutro Abs: 6.1 10*3/uL (ref 1.7–7.7)
Neutrophils Relative %: 93 %
Platelets: 332 10*3/uL (ref 150–400)
RBC: 5.2 MIL/uL — ABNORMAL HIGH (ref 3.87–5.11)
RDW: 12.5 % (ref 11.5–15.5)
WBC: 6.6 10*3/uL (ref 4.0–10.5)
nRBC: 0 % (ref 0.0–0.2)

## 2022-11-13 MED ORDER — MORPHINE SULFATE (PF) 2 MG/ML IV SOLN
2.0000 mg | INTRAVENOUS | Status: DC | PRN
  Administered 2022-11-13 – 2022-11-16 (×2): 2 mg via INTRAVENOUS
  Filled 2022-11-13 (×2): qty 1

## 2022-11-13 NOTE — Plan of Care (Signed)
Attempted to see the pt this evening. She had just received Morphine and was pretty out of it. CSF studies pending. Will re-evaluate in the AM tomorrow.  -- Milon Dikes, MD Neurologist [facility] Pager: [phone]

## 2022-11-13 NOTE — Progress Notes (Signed)
SLP Cancellation Note  Patient Details Name: Jody Nolan MRN: [id number] DOB: 1965-05-28   Cancelled treatment:       Reason Eval/Treat Not Completed: Patient not medically ready. Consulted with RN, who reports pt not appropriate for PO trials at this time. Family has decided against Cortrak placement, but they will continue IV hydration. Awaiting results to confirm CJD. SLP will recheck next date for appropriateness.  Spiro Ausborn B. Murvin Natal, [facility], CCC-SLP Speech Language Pathologist Office: [phone]  Leigh Aurora 11/13/2022, 12:11 PM

## 2022-11-13 NOTE — Progress Notes (Signed)
Progress Note   Patient: Jody Nolan TFT:[id number] DOB: 03/08/1966 DOA: 11/03/2022     10 DOS: the patient was seen and examined on 11/13/2022    Brief hospital course: 57yo with PMH of depression, anxiety, DUB presented on 7/10 to [facility] with complaints of confusion.  Neurology and ID were consulted. Underwent further workup including LP, MRI brain, and EEG.  MRI brain showed lateral abnormal cortical diffusion and FLAIR signal and EEG showed triphasic waves as well as severe encephalopathy.  Patient was started on IV Keppra and was sent to [facility] for long-term EEG monitoring.  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   -She has not responded to steroids, completed 7 days.   -Family at this time wants to wait to transition to comfort measures until someone can tell them definitively that this is a terminal condition - which might mean waiting for the CJD testing. They are moving in the direction of comfort now, and are considering addition of morphine. -She briefly had Cortrak for nutrition but pulled this out, was very agitated by it.  Family is not currently considering replacement. -As the diagnosis becomes more apparent, more family is coming to town to spend time with her.   -PICC was placed on 7/20.   -Palliative care is consulting.  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 Repeat MRI on 7/17 with extensive cortical hyperintensity with associated restricted diffusion, most c/w CJD Appreciate neurology follow-up. Started on IV doxycycline but now off.  ID followed but has signed off. Started on IV Keppra Completed 7  days of IV steroids Palliative care is consulting Awaiting definitive testing result for CJD Family is moving towards more of a comfort approach  History of anxiety and depression Not on any medication. Significant life stress PTA is unlikely to be the cause.  Unintentional weight loss Reported history.  Unclear about exact extent of weight loss. Could be related to mood disorder. Monitor for now. Receiving thiamine therapy for possible encephalopathy related to that Nutrition Problem: Severe Malnutrition Etiology: acute illness (acute encephalopathy) Signs/Symptoms: energy intake < or equal to 50% for > or equal to 5 days, mild fat depletion, moderate muscle depletion, percent weight loss (11% weight loss in 6 months) Percent weight loss: 11 % Interventions: Tube feeding, MVI Pulled out Cortrak within hours of placement, clearly agitated by it She has been able to tolerate floor stock liquids and puree/pudding solids with slow, small sips/bites but less responsive currently and not taking any PO   History of tick bite Reported history of tick bite. Concern for CNS encephalitis in the setting of tickborne illness - appears to have been ruled out ID followed initially No longer on Doxy.  Disposition Increasing UE tone, rigidity Overall, appears to have a very poor prognosis Family seems to be aware and understand  They are receptive to palliative care consultation, which happened initially on 7/19 Family is moving towards comfort care sequentially Patient is now DNR No longer receiving acute PT/OT therapies Family is continuing to encourage PO intake but patient is currently not taking any      Consultants: Neurology ID Palliative care PT/OT SLP [facility] team   Procedures:  EEG 7/12 LTM EEG 7/13, 7/18 Cortrak - placed and nearly immediately removed by patient on 7/17 LP (by IR at [facility] on 7/12, PTA)    Subjective: She is noticably worse today.  Less tracking eye  movements, less interactive, no smiles.  Did not follow commands today.  Family reports that she is not effectively swallowing.   Objective: Vitals:   11/13/22 0741 11/13/22 1139  BP: (!) 140/87 (!) 151/102  Pulse:    Resp: 18 18  Temp: 98.5 F (36.9 C) 98 F (36.7 C)  SpO2:      Intake/Output Summary (Last 24 hours) at 11/13/2022 1401 Last data filed at 11/13/2022 0700 Gross per 24 hour  Intake 1341.1 ml  Output 200 ml  Net 1141.1 ml   Filed Weights   11/09/22 0325 11/10/22 0700 11/13/22 0400  Weight: 66 kg 66 kg 64.3 kg    Exam:  General:  Awake, no attempt to engage, did not follow commands today; appearance subtly changed with more face fullness and less alertness Eyes:   clear conjunctivae ENT:  grossly normal lips & tongue, mmm Neck:  no LAD, masses or thyromegaly Cardiovascular:  RRR. No LE edema.  Respiratory:   CTA bilaterally with no wheezes/rales/rhonchi.  Normal respiratory effort. Abdomen:  soft, NT, ND Skin:  no rash or induration seen on limited exam; has picked off all but 2 of her artificial fingernails Musculoskeletal:  increasing hypertonicity of BUE > BLE with increasing rigidity, did not follow commands Psychiatric:  less engagement today, did not follow commands Neurologic:  increasing UE tone/rigidity; both arms were flexed with R hand contracted   Data Reviewed: I have reviewed the patient's lab results since admission.  Pertinent labs for today include:  Glucose 210 WBC 6.6 Hgb 15.3     Family Communication: Husband and son were present throughout evaluation  Disposition: Status is: Inpatient Remains inpatient appropriate because: critically ill  Planned Discharge Destination:  TBD    Time spent: 50 minutes  Author: Jonah Blue, MD 11/13/2022 2:01 PM  For on call review [email or web address].

## 2022-11-13 NOTE — Progress Notes (Signed)
Palliative:  HPI: 57 yo with PMH of depression, anxiety, DUB presented on 7/10 to [facility] with abrupt onset 3 weeks ago of progressive and precipitous cognitive decline, with first symptom being short term memory impairment, forgetting key details of her daughter's stated plans for a trip multiple times, manifested as forgetting over time frames of a few hours despite repeatedly being informed/corrected regarding the information she was not retaining. A few days later, her behavior started to become altered and somewhat inappropriate. Neurology and ID were consulted. Being worked up to rule of CJD, paraneoplastic syndrome,  autoimmune encephalitis, arbovirus, lyme disease, and/or RMSF. Palliative care has been consulted in the setting of this very difficult situation to support family and help discern goals of care if improvement neglect to be seen.   I met today at Jody Nolan Nolan's bedside along with husband "Jody Nolan" Jody Nolan Nolan) and son. Dr. Ophelia Charter is at bedside when I enter the room. I spoke further with family regarding goals of care. We were joined later by Jody Nolan Nolan's mother, Jody Nolan Nolan. Family have good understanding of poor prognosis. They are more interested in ensuring that Jody Nolan Nolan does not suffer any more than she already has. They are considering adding low dose morphine and potentially scheduling. She occasionally grimaces and is very tense worrying that she may be experiencing some discomfort. She is unable to express her needs at this time. We discussed that adding pain medication would cause her to be more sedated and sleepy. Jody Nolan worries about decreased intake but after discussing further she has only been accepting a couple bites on occasion - not enough to sustain her. She is receiving IV fluids for hydration.   We discussed interventions moving forward. No desire for feeding tube. They do wish to continue conservative measures and IV fluids until test results return. They are anticipating transition to full  comfort care if test results confirm suspicion for CJD. Jody Nolan also expresses that even if test is not clear confirmation he understands that prognosis remains poor. We discussed hospice options. They feel that they would prefer her to remain hospitalized for full comfort care if her time were very short. We did discuss that without IV fluids and not eating/drinking that prognosis likely < 2 weeks.   All questions/concerns addressed to best of my ability. Emotional support provided.   Exam: Fluctuating mentation. At best she seemed to maintain eye contact with me for a couple minutes. She seemed to try and smile. Non-verbal. Non-purposeful movements of arms. Occasional grimace. Breathing regular, unlabored. Abd soft.   Plan: - DNR in place - Awaiting official lab results to confirm diagnosis: maintain conservative treatment until results available - No desire for feeding tube; maintain IVF for now - Family considering adding comfort medication - Ongoing palliative care support  60 min  Yong Channel, NP Palliative Medicine Team Pager [phone] (Please see [email or web address] for schedule) Team Phone [phone]    Greater than 50%  of this time was spent counseling and coordinating care related to the above assessment and plan

## 2022-11-13 NOTE — Progress Notes (Signed)
Brief Nutrition Note  Discussed pt with Palliative Medicine NP and attending MD via Secure Chat. Per discussion, Cortrak replacement is no longer being considered by family. No plans for nutrition support.  Pt remains NPO except for small bites of purees and sips of thin liquids from floor stock with full supervision.  Per MD note, pt is noticeably worse today and is not effectively swallowing. GOC discussions are ongoing.  RD will monitor for GOC outcomes and potential diet advancement and ability to order oral nutrition supplements.   Mertie Clause, MS, RD, LDN Inpatient Clinical Dietitian Please see AMiON for contact information.

## 2022-11-14 LAB — BETA ISOFORM (CREUTZFELDT-JAKOB DISEASE)

## 2022-11-14 MED ORDER — ONDANSETRON 4 MG PO TBDP: 4.0000 mg | ORAL_TABLET | Freq: Four times a day (QID) | ORAL | Status: DC | PRN

## 2022-11-14 MED ORDER — ONDANSETRON HCL 4 MG/2ML IJ SOLN: 4.0000 mg | Freq: Four times a day (QID) | INTRAMUSCULAR | Status: DC | PRN

## 2022-11-14 MED ORDER — GLYCOPYRROLATE 0.2 MG/ML IJ SOLN: 0.2000 mg | INTRAMUSCULAR | Status: DC | PRN

## 2022-11-14 MED ORDER — POLYVINYL ALCOHOL 1.4 % OP SOLN: 1.0000 [drp] | Freq: Four times a day (QID) | OPHTHALMIC | Status: DC | PRN

## 2022-11-14 MED ORDER — GLYCOPYRROLATE 1 MG PO TABS: 1.0000 mg | ORAL_TABLET | ORAL | Status: DC | PRN

## 2022-11-14 MED ORDER — BIOTENE DRY MOUTH MT LIQD: 15.0000 mL | OROMUCOSAL | Status: DC | PRN

## 2022-11-14 MED ORDER — LORAZEPAM 2 MG/ML IJ SOLN: 1.0000 mg | INTRAMUSCULAR | Status: DC | PRN

## 2022-11-14 NOTE — Progress Notes (Signed)
Brief neurology progress note  Received results from the [facility] quick and 14 3 3  and detail protein all positive.  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 (result scanned in the chart - media section and hardcopy placed in the patient's physical chart) In addition to the clinical course as well as imaging findings, this is consistent with Nicholes Mango disease. This result was relayed to the husband over the phone and mother and son at bedside. Treatment from here on out supportive. Plan also discussed with Dr. Ophelia Charter from the hospitalist team and Yong Channel from the palliative medicine team. Family is going to get together this evening to discuss neck steps amongst themselves. Neurology will be available as needed. Please call with questions.  -- Milon Dikes, MD Neurologist [facility] Pager: [phone]   No charge note

## 2022-11-14 NOTE — Plan of Care (Signed)

## 2022-11-14 NOTE — Progress Notes (Addendum)
Palliative:  HPI: 57 yo with PMH of depression, anxiety, DUB presented on 7/10 to [facility] with abrupt onset 3 weeks ago of progressive and precipitous cognitive decline, with first symptom being short term memory impairment, forgetting key details of her daughter's stated plans for a trip multiple times, manifested as forgetting over time frames of a few hours despite repeatedly being informed/corrected regarding the information she was not retaining. A few days later, her behavior started to become altered and somewhat inappropriate. Neurology and ID were consulted. Being worked up to rule of CJD, paraneoplastic syndrome,  autoimmune encephalitis, arbovirus, lyme disease, and/or RMSF. Palliative care has been consulted in the setting of this very difficult situation to support family and help discern goals of care if improvement neglect to be seen.   I met today initially with mother, Jody Nolan, and son at bedside. Jody Nolan is less restless today with less grimacing. She seems overall more relaxed. She makes eye contact and waves back at me today. Still nonverbal. She does make eye contact and seems to make attempts to smile. Family report that Dr. Wilford Corner is going to call to follow up on CJD lab results.   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 Dr. Ophelia Charter is meeting with family this evening 5pm. I attended meeting and plans for transition to full comfort care. We discussed expectation that we will continue path of decline. However, since Jody Nolan was otherwise young and healthy prior to this illness this may still take some time even with full comfort care. Family is discussing the possibility of hospice facility. We discussed that we can provide medication to help with comfort for transport and if she were to decline prior to transport we can stop and maintain her care here. We encouraged family to visit hospice facility if they are interested in pursuing. No decisions for hospice at this time but will  transition to comfort care at this time here in the hospital.   All questions/concerns addressed. Emotional support provided. Very difficult situation and Dr. Ophelia Charter has been extremely supportive to family during this difficult time.   Exam: Alert. Nonverbal. Does not follow commands. No grimacing noted today. Waves and smiles at times. No distress. Breathing regular, unlabored. Abd soft.   Plan: - DNR - Full comfort care - Family considering hospice facility - no decisions - I will follow up tomorrow  40 min  Yong Channel, NP Palliative Medicine Team Pager [phone] (Please see [email or web address] for schedule) Team Phone [phone]    Greater than 50%  of this time was spent counseling and coordinating care related to the above assessment and plan

## 2022-11-14 NOTE — Progress Notes (Signed)
Speech Language Pathology Treatment: Dysphagia  Patient Details Name: Jody Nolan MRN: [id number] DOB: 02-06-1966 Today's Date: 11/14/2022 Time: 8295-6213 SLP Time Calculation (min) (ACUTE ONLY): 21 min  Assessment / Plan / Recommendation Clinical Impression  Pt is alert and smiled at SLP upon arrival, nodding her head "yes" a few times. Her mother and son are present and shared that they did not give her POs most of the weekend because she was only holding them in her mouth. They were interested in SLP trying to get her to take something though. Her presentation is similar this morning, not following commands for me despite just following a few with nursing just prior to my arrival. SLP provided Max multimodal cues and hand-over-hand assist with self-feeding but pt not able to initiate getting POs from spoon or straw. SLP tried to siphon a small amount of water onto her tongue but suction was ultimately used as pt demonstrated oral holding. Discussed findings with pt/family, who are still awaiting results of testing to help their decision making. While NPO, could consider small bites of purees or sips of liquids but would be very mindful of her initiation deficits. Discussed presenting her with an empty spoon/straw to see if she would interact with it before offering boluses, and that they can always focus primarily on oral care.   HPI HPI: Pt is a 57 yo female presenting to [facility] 7/10 with several weeks of worsening AMS; transferred to [facility] 7/12. MRI of brain showed widespread, bilateral abnormal cortical diffusion and FLAIR signal in a nonspecific pattern. 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 SLP bedside swallow evaluation ordered on 7/16 due to concern of patient holding PO's in mouth and having potential decline in swallowing abilities. She had Cortrak plaed 7/17 but  pulled it out later that date.      SLP Plan  Continue with current plan of care      Recommendations for follow up therapy are one component of a multi-disciplinary discharge planning process, led by the attending physician.  Recommendations may be updated based on patient status, additional functional criteria and insurance authorization.    Recommendations  Diet recommendations: NPO;Other(comment) (floor stock liquids and puree/pudding solids IF initiating a swallow) Liquids provided via: Teaspoon;Cup;Straw Medication Administration: Via alternative means Supervision: Trained caregiver to feed patient;Full supervision/cueing for compensatory strategies Compensations: Slow rate;Small sips/bites;Minimize environmental distractions Postural Changes and/or Swallow Maneuvers: Seated upright 90 degrees                  Oral care QID   Frequent or constant Supervision/Assistance Dysphagia, unspecified (R13.10)     Continue with current plan of care      Mahala Menghini., M.A. CCC-SLP Acute Rehabilitation Services Office [phone]  Secure chat preferred   11/14/2022, 9:34 AM

## 2022-11-14 NOTE — Plan of Care (Signed)
  Problem: Education: Goal: Knowledge of General Education information will improve Description: Including pain rating scale, medication(s)/side effects and non-pharmacologic comfort measures Outcome: Progressing   Problem: Clinical Measurements: Goal: Ability to maintain clinical measurements within normal limits will improve Outcome: Progressing Goal: Will remain free from infection Outcome: Progressing Goal: Diagnostic test results will improve Outcome: Progressing Goal: Respiratory complications will improve Outcome: Progressing Goal: Cardiovascular complication will be avoided Outcome: Progressing   Problem: Activity: Goal: Risk for activity intolerance will decrease Outcome: Progressing   Problem: Nutrition: Goal: Adequate nutrition will be maintained Outcome: Progressing   Problem: Coping: Goal: Level of anxiety will decrease Outcome: Progressing   Problem: Elimination: Goal: Will not experience complications related to bowel motility Outcome: Progressing Goal: Will not experience complications related to urinary retention Outcome: Progressing   Problem: Pain Managment: Goal: General experience of comfort will improve Outcome: Progressing   Problem: Safety: Goal: Ability to remain free from injury will improve Outcome: Progressing   Problem: Skin Integrity: Goal: Risk for impaired skin integrity will decrease Outcome: Progressing   Problem: Education: Goal: Knowledge of the prescribed therapeutic regimen will improve Outcome: Progressing   Problem: Coping: Goal: Ability to identify and develop effective coping behavior will improve Outcome: Progressing   Problem: Clinical Measurements: Goal: Quality of life will improve Outcome: Progressing   Problem: Respiratory: Goal: Verbalizations of increased ease of respirations will increase Outcome: Progressing   Problem: Role Relationship: Goal: Family's ability to cope with current situation will  improve Outcome: Progressing Goal: Ability to verbalize concerns, feelings, and thoughts to partner or family member will improve Outcome: Progressing   Problem: Pain Management: Goal: Satisfaction with pain management regimen will improve Outcome: Progressing

## 2022-11-14 NOTE — Progress Notes (Signed)
Progress Note   Patient: Jody Nolan WGN:[id number] DOB: 10-Jul-1965 DOA: 11/03/2022     11 DOS: the patient was seen and examined on 11/14/2022   Brief hospital course: 57yo with PMH of depression, anxiety, DUB presented on 7/10 to [facility] with complaints of confusion.  Neurology and ID were consulted. Underwent further workup including LP, MRI brain, and EEG.  MRI brain showed lateral abnormal cortical diffusion and FLAIR signal and EEG showed triphasic waves as well as severe encephalopathy.  Patient was started on IV Keppra and was sent to [facility] for long-term EEG monitoring.  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   -She has not responded to steroids, completed 7 days.   -Family at this time wants to wait to transition to comfort measures until someone can tell them definitively that this is a terminal condition - which might mean waiting for the CJD testing. They are moving in the direction of comfort now, and are considering addition of morphine. -She briefly had Cortrak for nutrition but pulled this out, was very agitated by it.  Family is not currently considering replacement. -As the diagnosis becomes more apparent, more family is coming to town to spend time with her.   -PICC was placed on 7/20.   -Palliative care is consulting. 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  Family is deciding about whether to transition to [facility].  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 Repeat MRI on 7/17 with extensive cortical hyperintensity with associated restricted diffusion, most c/w CJD Appreciate neurology  follow-up. Started on IV doxycycline but now off.  ID followed but has signed off. 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 Significant life stress PTA is unlikely to be the cause.  Unintentional weight loss Reported history.  Unclear about exact extent of weight loss. Could be related to mood disorder. Monitor for now. Receiving thiamine therapy for possible encephalopathy related to that Nutrition Problem: Severe Malnutrition Etiology: acute illness (acute encephalopathy) Signs/Symptoms: energy intake < or equal to 50% for > or equal to 5 days, mild fat depletion, moderate muscle depletion, percent weight loss (11% weight loss in 6 months) Percent weight loss: 11 % Interventions: Tube feeding, MVI Pulled out Cortrak within hours of placement, clearly agitated by it She has been able to tolerate floor stock liquids and puree/pudding solids with slow, small sips/bites but less responsive currently and not taking any PO   History of tick bite Reported history of tick bite. Concern for CNS encephalitis in the setting of tickborne illness - appears to have been ruled out ID followed initially No longer on Doxy.  Consultants: Neurology ID Palliative care PT/OT SLP [facility] team   Procedures: EEG 7/12 LTM EEG 7/13, 7/18 Cortrak - placed and nearly immediately removed by patient on 7/17 LP (by IR at [facility] on 7/12, PTA)  Subjective: More alert today, periodically interactive but did not attempt to follow commands   Objective: Vitals:   11/14/22 1217 11/14/22 1230  BP:  122/81  Pulse: 94 96  Resp:    Temp:     SpO2: 94% 95%    Intake/Output Summary (Last 24 hours) at 11/14/2022 1802 Last data filed at 11/14/2022 0846 Gross per 24 hour  Intake 1270.92 ml  Output --  Net 1270.92 ml   Filed Weights   11/10/22 0700 11/13/22 0400 11/14/22 0410  Weight: 66 kg 64.3 kg 65.3 kg    Exam:  General:  Appears calm and comfortable and is in NAD, periodically makes eye contact and at other times stares blankly Eyes:   normal lids, iris ENT:  grossly normal lips & tongue, mmm Neck:  no LAD, masses or thyromegaly Cardiovascular:  RRR. No LE edema.  Respiratory:   CTA bilaterally with no wheezes/rales/rhonchi.  Normal respiratory effort. Abdomen:  soft, NT, ND Skin:  no rash or induration seen on limited exam; has picked off all but 2 of her artificial fingernails  Musculoskeletal:  increasing hypertonicity of BUE > BLE with increasing rigidity, did not follow commands Psychiatric:  engagement waxes and wanes; minimal interaction this AM but had a huge smile for her niece this evening Neurologic:  increasing UE tone/rigidity; both arms were flexed with thumbs extended for most of the day  Data Reviewed: I have reviewed the patient's lab results since admission.  Pertinent labs for today include:   None    Family Communication: Mother and son were present this AM but all family were present for family meeting this evening with decision to transition to comfort care fully (husband, twin young adult children, mother, father, brother)  Disposition: Status is: Inpatient Remains inpatient appropriate because: terminally ill, neurologically devastated  Planned Discharge Destination:  in-hospital death vs. Residential hospice     Total critical care time: 65 minutes Critical care time was exclusive of separately billable procedures and treating other patients. Critical care was necessary to treat or prevent imminent or life-threatening deterioration. Critical care was time spent personally by me on  the following activities: development of treatment plan with patient and/or surrogate as well as nursing, discussions with consultants, evaluation of patient's response to treatment, examination of patient, obtaining history from patient or surrogate, ordering and performing treatments and interventions, ordering and review of laboratory studies, ordering and review of radiographic studies, pulse oximetry and re-evaluation of patient's condition.     Author: Jonah Blue, MD 11/14/2022 6:02 PM  For on call review [email or web address].

## 2022-11-15 DIAGNOSIS — Z515 Encounter for palliative care: Secondary | ICD-10-CM

## 2022-11-15 DIAGNOSIS — E43 Unspecified severe protein-calorie malnutrition: Secondary | ICD-10-CM

## 2022-11-15 LAB — MISC LABCORP TEST (SEND OUT)

## 2022-11-15 MED ORDER — GLYCOPYRROLATE 0.2 MG/ML IJ SOLN
0.2000 mg | Freq: Three times a day (TID) | INTRAMUSCULAR | Status: DC
  Administered 2022-11-15 – 2022-11-16 (×4): 0.2 mg via INTRAVENOUS
  Filled 2022-11-15 (×4): qty 1

## 2022-11-15 NOTE — Progress Notes (Signed)
PROGRESS NOTE    Jody Nolan  WUJ:[id number] DOB: 1965/04/26 DOA: 11/03/2022 PCP: Jacky Kindle, FNP   Brief Narrative:  Brief hospital course: The patient is a 57yo with PMH of depression, anxiety, DUB presented on 7/10 to [facility] with complaints of confusion.  Neurology and ID were consulted. She Underwent further workup including LP, MRI brain, and EEG.  MRI brain showed lateral abnormal cortical diffusion and FLAIR signal and EEG showed triphasic waves as well as severe encephalopathy.  Patient was started on IV Keppra and was sent to [facility] for long-term EEG monitoring.  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    Interim History: -She has not responded to steroids, completed 7 days.   -She briefly had Cortrak for nutrition but pulled this out, was very agitated by it.  Family is not currently considering replacement. -Diagnosis becomes more apparent and [facility] RT quick and 14 3 3  Protein all Positive.   -PICC was placed on 7/20.   -Palliative care is consulting. 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  Family is deciding about whether to transition to [facility] and husband touring [employer] today.   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 -Repeat MRI on 7/17 with extensive cortical hyperintensity with associated restricted diffusion, most c/w CJD -Appreciate neurology follow-up. -Started on IV doxycycline but now off.  ID followed but has signed off. 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 -Significant life stress PTA is unlikely to be the cause.  Unintentional Weight Loss -Reported history.   -Unclear about exact extent of weight loss. -Could be related to mood disorder. Monitor for now. Receiving thiamine therapy for possible encephalopathy related to that Nutrition Problem: Severe Malnutrition Etiology: acute illness (acute encephalopathy) Signs/Symptoms: energy intake < or equal to 50% for > or equal to 5 days, mild fat depletion, moderate muscle depletion, percent weight loss (11% weight loss in 6 months) Percent weight loss: 11 % Interventions: Tube feeding, MVI -Pulled out Cortrak within hours of placement, clearly agitated by it -She has been able to tolerate floor stock liquids and puree/pudding solids with slow, small sips/bites b   History of Tick Bite -Reported history of tick bite. -Concern for CNS encephalitis in the setting of tickborne illness - appears to have been ruled out -ID followed initially and now signed off -No longer on Doxy.  All medications not in the patient's comfort have been discontinued as patient has been made comfort care.  Anticipating discharging to residential hospice once bed is available.   DVT prophylaxis: None as patient is comfort care    Code Status: DNR Family Communication: Discussed with the patient's husband at bedside  Disposition Plan:  Level of care: Palliative Care Status is: Inpatient Remains inpatient appropriate because: Needs residential hospice placement and awaiting bed availability   Consultants:  Palliative Care Medicine Neurology  Procedures:  As delineated above  Antimicrobials:  Anti-infectives (From admission,  onward)    Start     Dose/Rate Route Frequency Ordered Stop   11/06/22 2200  doxycycline (VIBRA-TABS) tablet 100 mg  Status:  Discontinued        100 mg Oral Every 12 hours 11/06/22 1341 11/06/22 1343   11/06/22 2200  doxycycline (VIBRA-TABS) tablet 100 mg  Status:  Discontinued        100 mg Oral 2 times daily before breakfast and at bedtime 11/06/22 1343 11/07/22 0959   11/03/22 2230  doxycycline (VIBRAMYCIN) 100 mg in sodium chloride 0.9 % 250 mL IVPB  Status:  Discontinued        100 mg 125 mL/hr over 120 Minutes Intravenous Every 12 hours 11/03/22 2133 11/06/22 1341   11/03/22 2200  doxycycline (VIBRA-TABS) tablet 100 mg  Status:  Discontinued        100 mg Oral Every 12 hours 11/03/22 2017 11/03/22 2133       Subjective: Seen and examined at bedside and smiles and waves but does not really interact.  Husband states that she had a very restful night and after the Foley catheter was placed he feels that she is much more comfortable.  He is going to be touring [employer], sport and exercise later today.  No other concerns or complaints at this time.  Objective: Vitals:   11/14/22 1217 11/14/22 1230 11/14/22 2250 11/15/22 0823  BP:  122/81 105/65 (!) 152/90  Pulse: 94 96 69 95  Resp:   19 18  Temp:   98.6 F (37 C) 99.1 F (37.3 C)  TempSrc:   Oral Oral  SpO2: 94% 95% 97% 98%  Weight:        Intake/Output Summary (Last 24 hours) at 11/15/2022 1313 Last data filed at 11/15/2022 6295 Gross per 24 hour  Intake --  Output 1850 ml  Net -1850 ml   Filed Weights   11/10/22 0700 11/13/22 0400 11/14/22 0410  Weight: 66 kg 64.3 kg 65.3 kg   Examination: Physical Exam:  Constitutional: WN/WD Caucasian female in no acute distress remains encephalopathic but waves and smiles Respiratory: Diminished to auscultation bilaterally, no wheezing, rales, rhonchi or crackles. Normal respiratory effort and patient is not tachypenic. No accessory muscle use.  Unlabored breathing Cardiovascular: RRR, no murmurs  / rubs / gallops. S1 and S2 auscultated. No extremity edema. 2+ pedal pulses. No carotid bruits.  Abdomen: Soft, non-tender, non-distended. Bowel sounds positive.  GU: Deferred. Skin: No rashes, lesions, ulcers on limited skin evaluation. No induration; Warm and dry.  Neurologic: She smiles and waves but has minimal interaction.  Has some hypertonicity of the bilateral upper extremities compared to bilateral extremities Psychiatric: Impaired judgment and insight and interacts minimally with smiling and waving  Data Reviewed: I have personally reviewed following labs and imaging studies  CBC: Recent Labs  Lab 11/09/22 0845 11/13/22 0120  WBC 9.3 6.6  NEUTROABS 8.0* 6.1  HGB 13.3 15.3*  HCT 41.4 46.4*  MCV 93.5 89.2  PLT 293 332   Basic Metabolic Panel: Recent Labs  Lab 11/08/22 1511 11/09/22 0845 11/09/22 1640 11/13/22 0120  NA  --  137  --  137  K  --  3.6  --  3.9  CL  --  104  --  103  CO2  --  26  --  22  GLUCOSE  --  141*  --  210*  BUN  --  14  --  16  CREATININE  --  0.57  --  0.76  CALCIUM  --  9.0  --  9.1  MG 2.3 2.5* 2.3  --   PHOS 3.4 2.7 2.8  --    GFR: Estimated Creatinine Clearance: 73 mL/min (by C-G formula based on SCr of 0.76 mg/dL). Liver Function Tests: No results for input(s): "AST", "ALT", "ALKPHOS", "BILITOT", "PROT", "ALBUMIN" in the last 168 hours. No results for input(s): "LIPASE", "AMYLASE" in the last 168 hours. No results for input(s): "AMMONIA" in the last 168 hours. Coagulation Profile: No results for input(s): "INR", "PROTIME" in the last 168 hours. Cardiac Enzymes: No results for input(s): "CKTOTAL", "CKMB", "CKMBINDEX", "TROPONINI" in the last 168 hours. BNP (last 3 results) No results for input(s): "PROBNP" in the last 8760 hours. HbA1C: No results for input(s): "HGBA1C" in the last 72 hours. CBG: Recent Labs  Lab 11/08/22 2333 11/09/22 0324 11/09/22 0811 11/09/22 1113 11/09/22 1533  GLUCAP 174* 168* 146* 128* 205*    Lipid Profile: No results for input(s): "CHOL", "HDL", "LDLCALC", "TRIG", "CHOLHDL", "LDLDIRECT" in the last 72 hours. Thyroid Function Tests: No results for input(s): "TSH", "T4TOTAL", "FREET4", "T3FREE", "THYROIDAB" in the last 72 hours. Anemia Panel: No results for input(s): "VITAMINB12", "FOLATE", "FERRITIN", "TIBC", "IRON", "RETICCTPCT" in the last 72 hours. Sepsis Labs: No results for input(s): "PROCALCITON", "LATICACIDVEN" in the last 168 hours.  No results found for this or any previous visit (from the past 240 hour(s)).   Radiology Studies: No results found.  Scheduled Meds:  bisacodyl  10 mg Rectal Once   Chlorhexidine Gluconate Cloth  6 each Topical Daily   glycopyrrolate  0.2 mg Intravenous TID   mouth rinse  15 mL Mouth Rinse 4 times per day   sodium chloride flush  10-40 mL Intracatheter Q12H   sodium chloride flush  3 mL Intravenous Q12H   Continuous Infusions:  levETIRAcetam 1,000 mg (11/15/22 1030)    LOS: 12 days   Marguerita Merles, DO [facility] Available via Epic secure chat 7am-7pm After these hours, please refer to coverage provider listed on [email or web address] 11/15/2022, 1:13 PM

## 2022-11-15 NOTE — Progress Notes (Signed)
Palliative:  HPI: 57 yo with PMH of depression, anxiety, DUB presented on 7/10 to [facility] with abrupt onset 3 weeks ago of progressive and precipitous cognitive decline, with first symptom being short term memory impairment, forgetting key details of her daughter's stated plans for a trip multiple times, manifested as forgetting over time frames of a few hours despite repeatedly being informed/corrected regarding the information she was not retaining. A few days later, her behavior started to become altered and somewhat inappropriate. Neurology and ID were consulted. Being worked up to rule of CJD, paraneoplastic syndrome, autoimmune encephalitis, arbovirus, lyme disease, and/or RMSF. Palliative care has been consulted in the setting of this very difficult situation to support family and help discern goals of care if improvement neglect to be seen.    I met today with Jody Nolan) at Jody Nolan's bedside. She is having another day where she is able to have interactions with family. She does seem to have understanding of conversation although I am unsure how much she is able to retain. She does wave and smile. She even attempts to verbally speak with me but unable to understand her. She overall seems more comfortable than she did the day before. Medications available for pain, shortness of breath, anxiety as needed - family aware to request if needed. Jody does express some concerns with secretion management - Robinul scheduled. He plans to visit [facility] this afternoon as he agrees that hospice facility may be a good option for them. We discussed [facility] further as this will be a short distance for Jody Nolan to travel and a distance/location for all family to get to her.   We further discussed Jody Nolan's understanding and awareness of what we are saying in her presence. She did express some facial expressions of concern/worry while Jody was speaking with Dr. Marland Mcalpine. I was able to redirect conversation and  Jody Nolan seemed more relaxed. I expressed to husband that she seems relaxed and if she were retaining these conversation I feel she would exhibit more signs of distress and anxiety. I encouraged him to redirect the conversation to something more positive and reassuring.   Update: I returned to bedside and husband and mother have had a chance to visit [employer]. They were very impressed and will discuss with the rest of the family but feel they will likely want to pursue placement tomorrow. Hospice liaison is aware and will follow up with them tomorrow.   All questions/concerns addressed. Emotional support provided.   Exam: Alert, oriented difficult to assess but she can at times she can follow some simple commands or mimic greeting. No distress. Breathing regular unlabored. Abd soft. Generalized weakness and fatigue.   Plan: - DNR, comfort care - Family having discussions about desire for transition to [facility]  55 min  Yong Channel, NP Palliative Medicine Team Pager [phone] (Please see [email or web address] for schedule) Team Phone [phone]    Greater than 50%  of this time was spent counseling and coordinating care related to the above assessment and plan

## 2022-11-15 NOTE — Progress Notes (Signed)
Nutrition Brief Note ° °Chart reviewed. °Pt now transitioning to comfort care.  °No further nutrition interventions planned at this time.  °Please re-consult as needed. ° ° °Kate Holmes, MS, RD, LDN °Inpatient Clinical Dietitian °Please see AMiON for contact information. ° ° °

## 2022-11-16 DIAGNOSIS — Z515 Encounter for palliative care: Secondary | ICD-10-CM

## 2022-11-16 LAB — MISC LABCORP TEST (SEND OUT): Labcorp test code: 9985

## 2022-11-16 MED ORDER — BISACODYL 10 MG RE SUPP: 10.0000 mg | Freq: Every day | RECTAL | 0 refills | Status: AC | PRN

## 2022-11-16 MED ORDER — POLYVINYL ALCOHOL 1.4 % OP SOLN: 1.0000 [drp] | Freq: Four times a day (QID) | OPHTHALMIC | Status: AC | PRN

## 2022-11-16 MED ORDER — ORAL CARE MOUTH RINSE: 15.0000 mL | Freq: Four times a day (QID) | OROMUCOSAL | Status: AC | PRN

## 2022-11-16 MED ORDER — KETOROLAC TROMETHAMINE 15 MG/ML IJ SOLN: 15.0000 mg | Freq: Four times a day (QID) | INTRAMUSCULAR | Status: AC | PRN

## 2022-11-16 MED ORDER — GLYCOPYRROLATE 1 MG PO TABS: 1.0000 mg | ORAL_TABLET | ORAL | Status: AC | PRN

## 2022-11-16 MED ORDER — LORAZEPAM 2 MG/ML PO CONC: 2.0000 mg | Freq: Three times a day (TID) | ORAL | Status: DC

## 2022-11-16 MED ORDER — MORPHINE SULFATE (PF) 2 MG/ML IV SOLN: 2.0000 mg | INTRAVENOUS | Status: AC | PRN

## 2022-11-16 MED ORDER — LORAZEPAM 2 MG/ML IJ SOLN: 1.0000 mg | INTRAMUSCULAR | Status: AC | PRN

## 2022-11-16 MED ORDER — ACETAMINOPHEN 325 MG PO TABS: 650.0000 mg | ORAL_TABLET | Freq: Four times a day (QID) | ORAL | Status: AC | PRN

## 2022-11-16 MED ORDER — LORAZEPAM 2 MG/ML IJ SOLN: 2.0000 mg | Freq: Three times a day (TID) | INTRAMUSCULAR | Status: DC

## 2022-11-16 MED ORDER — HALOPERIDOL LACTATE 5 MG/ML IJ SOLN: 2.0000 mg | Freq: Four times a day (QID) | INTRAMUSCULAR | Status: AC | PRN

## 2022-11-16 MED ORDER — KETOROLAC TROMETHAMINE 15 MG/ML IJ SOLN: 15.0000 mg | Freq: Four times a day (QID) | INTRAMUSCULAR | Status: DC | PRN

## 2022-11-16 MED ORDER — BIOTENE DRY MOUTH MT LIQD: 15.0000 mL | OROMUCOSAL | Status: AC | PRN

## 2022-11-16 MED ORDER — ONDANSETRON 4 MG PO TBDP: 4.0000 mg | ORAL_TABLET | Freq: Four times a day (QID) | ORAL | Status: AC | PRN

## 2022-11-16 NOTE — TOC Transition Note (Addendum)
Transition of Care [facility]) - CM/SW Discharge Note   Patient Details  Name: Jody Nolan MRN: [id number] Date of Birth: 01/10/1966  Transition of [facility]) CM/SW Contact:  Lockie Pares, RN Phone Number: 11/16/2022, 10:24 AM   Clinical Narrative:    Patient with CJD, DNR husband toured [facility]. Will transfer today when bed available per [facility]. Patient has PICC which will be kept in place for medications of comfort. 1155 Messaged with Team, ready for transport. Called [facility] to transport to [employer].    Final next level of care: Hospice Medical Facility Barriers to Discharge: No Barriers Identified   Patient Goals and CMS Choice      Discharge Placement  [facility]                       Discharge Plan and Services Additional resources added to the After Visit Summary for                                       Social Determinants of Health (SDOH) Interventions SDOH Screenings   Food Insecurity: Patient Unable To Answer (11/02/2022)  Housing: Patient Unable To Answer (11/02/2022)  Transportation Needs: Patient Unable To Answer (11/02/2022)  Utilities: Patient Unable To Answer (11/02/2022)  Alcohol Screen: Low Risk  (03/11/2021)  Depression (PHQ2-9): Low Risk  (05/01/2022)  Tobacco Use: Low Risk  (11/03/2022)     Readmission Risk Interventions     No data to display

## 2022-11-16 NOTE — Progress Notes (Addendum)
[facility] Liaison Note  Referral received for patient/family interest in [facility]. Chart under review by [facility] physician.   Bed offered and accepted for transfer today to [facility]. Unit RN please call report to [phone] prior to patient leaving the unit. Please send signed DNR and paperwork with patient.   Please leave all IV access and ports in place.   Please call with any questions or concerns. Thank you  Dionicio Stall, [facility] Liaison [phone]

## 2022-11-16 NOTE — Discharge Summary (Signed)
Physician Discharge Summary   Patient: Jody Nolan MRN: [id number] DOB: Mar 28, 1966  Admit date:     11/03/2022  Discharge date: 11/16/22  Discharge Physician: Marguerita Merles, DO   PCP: Jacky Kindle, FNP   Recommendations at discharge:    Further Care per Hospice Protocol  Discharge Diagnoses: Principal Problem:   Acute encephalopathy Active Problems:   Hypokalemia   Anxiety and depression   Protein-calorie malnutrition, severe   Hospice care patient  Resolved Problems:   * No resolved hospital problems. [facility] Course: Brief hospital course: The patient is a 57yo with PMH of depression, anxiety, DUB presented on 7/10 to [facility] with complaints of confusion.  Neurology and ID were consulted. She Underwent further workup including LP, MRI brain, and EEG.  MRI brain showed lateral abnormal cortical diffusion and FLAIR signal and EEG showed triphasic waves as well as severe encephalopathy.  Patient was started on IV Keppra and was sent to [facility] for long-term EEG monitoring.  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    Interim History: -She has not responded to steroids, completed 7 days.   -She briefly had Cortrak for nutrition but pulled this out, was very agitated by it.  Family is not currently considering replacement. -Diagnosis becomes more apparent and [facility] RT quick and 14 3 3  Protein all Positive.   -PICC was placed on 7/20.   -Palliative care is consulting. 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  Family is deciding about whether to transition to [facility] and husband touring [employer] today.   She is stable to Transfer to [employer] today and will continue End of Life care at [facility].   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 -Repeat MRI on 7/17 with extensive cortical hyperintensity with associated restricted diffusion, most c/w CJD -Appreciate neurology follow-up. -Started on IV doxycycline but now off.  ID followed but has signed off. 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 -Significant life stress PTA is unlikely to be the cause.  Unintentional Weight Loss -Reported history.   -Unclear about exact extent of weight loss. -Could be related to mood disorder. Monitor for now. Receiving thiamine therapy for possible encephalopathy related to that Nutrition Problem: Severe Malnutrition Etiology: acute illness (acute encephalopathy) Signs/Symptoms: energy intake < or equal to 50% for > or equal to 5 days, mild fat depletion, moderate muscle depletion, percent weight loss (11% weight loss in 6 months) Percent weight loss: 11 % Interventions: Tube feeding, MVI -Pulled out Cortrak within hours of placement, clearly agitated by it -She has been able to tolerate floor stock liquids and puree/pudding solids with slow, small sips/bites b   History of Tick Bite -Reported history of tick bite. -Concern for CNS encephalitis in the setting of tickborne illness - appears to have been ruled out -  ID followed initially and now signed off -No longer on Doxy.  All medications not in the patient's comfort have been discontinued as patient has been made  comfort care.  Will be discharging to residential hospice now that bed is available.  Nutrition Status: Nutrition Problem: Severe Malnutrition Etiology: acute illness (acute encephalopathy) Signs/Symptoms: energy intake < or equal to 50% for > or equal to 5 days, mild fat depletion, moderate muscle depletion, percent weight loss (11% weight loss in 6 months) Percent weight loss: 11 % Interventions: Tube feeding, MVI  Nutrition Documentation    Flowsheet Row Admission (Current) from 11/03/2022 in [facility] [facility]  Nutrition Problem Severe Malnutrition  Etiology acute illness  [acute encephalopathy]  Nutrition Goal Patient will meet greater than or equal to 90% of their needs  Interventions Tube feeding, MVI      Consultants: Palliative Care Medicine and Neurology Procedures performed: As delineated as above  Disposition: Hospice care Diet recommendation:  NPO but can have small bites of purees and sips of thin liquids from floor stock with full supervision  DISCHARGE MEDICATION: Allergies as of 11/16/2022       Reactions   Adhesive [tape] Rash   Bandaids cause rash        Medication List     STOP taking these medications    ALPRAZolam 0.25 MG tablet Commonly known as: XANAX   doxycycline 100 MG tablet Commonly known as: VIBRA-TABS   fluticasone 50 MCG/ACT nasal spray Commonly known as: FLONASE   levETIRAcetam 1000 MG/100ML Soln Commonly known as: KEPPRA   montelukast 10 MG tablet Commonly known as: SINGULAIR   phentermine 37.5 MG capsule   Semaglutide-Weight Management 2.4 MG/0.75ML Soaj   sodium chloride 0.9 % SOLN 50 mL with thiamine 100 MG/ML SOLN 500 mg   valACYclovir 1000 MG tablet Commonly known as: VALTREX       TAKE these medications    acetaminophen 325 MG tablet Commonly known as: TYLENOL Take 2 tablets (650 mg total) by mouth every 6 (six) hours as needed for mild pain (or Fever >/= 101).   antiseptic oral rinse  Liqd Apply 15 mLs topically as needed for dry mouth.   mouth rinse Liqd solution 15 mLs by Mouth Rinse route every 6 (six) hours as needed.   bisacodyl 10 MG suppository Commonly known as: DULCOLAX Place 1 suppository (10 mg total) rectally daily as needed for moderate constipation.   glycopyrrolate 1 MG tablet Commonly known as: ROBINUL Take 1 tablet (1 mg total) by mouth every 4 (four) hours as needed (excessive secretions).   haloperidol lactate 5 MG/ML injection Commonly known as: HALDOL Inject 0.4-1 mLs (2-5 mg total) into the vein every 6 (six) hours as needed.   ketorolac 15 MG/ML injection Commonly known as: TORADOL Inject 1 mL (15 mg total) into the vein every 6 (six) hours as needed for mild pain (fever).   LORazepam 2 MG/ML injection Commonly known as: ATIVAN Inject 0.5-1 mLs (1-2 mg total) into the vein every 4 (four) hours as needed for anxiety or seizure.   morphine (PF) 2 MG/ML injection Inject 1 mL (2 mg total) into the vein every 2 (two) hours as needed (as evidenced by tachycardia, grimacing, other non-verbal cues).   ondansetron 4 MG disintegrating tablet Commonly known as: ZOFRAN-ODT Take 1 tablet (4 mg total) by mouth every 6 (six) hours as needed for nausea.   polyvinyl alcohol 1.4 % ophthalmic solution Commonly known as: LIQUIFILM TEARS Place 1 drop  into both eyes 4 (four) times daily as needed for dry eyes.       Discharge Exam: Filed Weights   11/10/22 0700 11/13/22 0400 11/14/22 0410  Weight: 66 kg 64.3 kg 65.3 kg   Vitals:   11/15/22 2257 11/16/22 0759  BP: (!) 139/92 132/88  Pulse: (!) 115 96  Resp: 18 17  Temp: 99.2 F (37.3 C) 99.4 F (37.4 C)  SpO2: 96% 94%   Examination: Physical Exam:  Constitutional: WN/WD Caucasian female in NAD appears encephalopathic and a little bit more somnolent Respiratory: Diminished to auscultation bilaterally, no wheezing, rales, rhonchi or crackles. Normal respiratory effort and patient is not  tachypenic. No accessory muscle use. Unlabored breathing Cardiovascular: RRR, no murmurs / rubs / gallops. S1 and S2 auscultated. No extremity edema.  Abdomen: Soft, non-tender, non-distended. Bowel sounds positive.  GU: Deferred. Skin: No rashes, lesions, ulcers on a limited skin evaluation. No induration; Warm and dry.  Neurologic: More withdrawn and a little bit more somnolent Psychiatric: Impaired judgment and insight. Alert and oriented x 3. Normal mood and appropriate affect.   Condition at discharge: stable  The results of significant diagnostics from this hospitalization (including imaging, microbiology, ancillary and laboratory) are listed below for reference.   Imaging Studies: [place] EKG SITE RITE  Result Date: 11/11/2022 If Site Rite image not attached, placement could not be confirmed due to current cardiac rhythm.  Overnight EEG with video  Result Date: 11/09/2022 Charlsie Quest, MD     11/10/2022 10:24 AM Patient Name: Jody Nolan MRN: [id number] Epilepsy Attending: Charlsie Quest Referring Physician/Provider: Elmer Picker, NP  Duration: 11/08/2022 0942 to 11/09/2022 1115  Patient history: 57 year old female with a history of altered mental status the past 3 weeks. EEG to evaluate for seizure  Level of alertness: Awake, asleep  AEDs during EEG study: LEV  Technical aspects: This EEG study was done with scalp electrodes positioned according to the 10-20 International system of electrode placement. Electrical activity was reviewed with band pass filter of 1-70Hz , sensitivity of 7 uV/mm, display speed of 61mm/sec with a 60Hz  notched filter applied as appropriate. EEG data were recorded continuously and digitally stored.  Video monitoring was available and reviewed as appropriate.  Description: No clear posterior dominant rhythm was seen. EEG showed continuous generalized 3 to 6 Hz theta-delta slowing. Intermittent generalized and lateralized left hemisphere periodic discharges with  triphasic morphology were noted at 0.5-1 Hz, more frequent when awake/stimulated. Hyperventilation and photic stimulation were not performed.   EEG was disconnected between 11/08/2022 1153 to 1310 for  imaging.   ABNORMALITY - Periodic discharges with triphasic morphology, generalized  and lateralized left hemisphere - Continuous slow, generalized  IMPRESSION: This study showed periodic discharges with triphasic morphology which now appears more predominantly in left hemisphere. This pattern is on the ictal-interictal continuum specifically the lateralized nature of the discharges which can suggest underlying cortical abnormality. Creutzfeldt-Jakob disease (CJD) can have similar EEG appearance although EEG is not diagnostic. Additionally there is moderate diffuse encephalopathy. No definite seizures were seen throughout the recording. EEG appears similar to study on 11/06/2022  Charlsie Quest  MR BRAIN W WO CONTRAST  Result Date: 11/08/2022 CLINICAL DATA:  Mental status change, unknown cause. EXAM: MRI HEAD WITHOUT AND WITH CONTRAST TECHNIQUE: Multiplanar, multiecho pulse sequences of the brain and surrounding structures were obtained without and with intravenous contrast. CONTRAST:  7mL GADAVIST GADOBUTROL 1 MMOL/ML IV SOLN COMPARISON:  MRI of the brain November 02, 2022. FINDINGS: Brain: Redemonstrated  extensive confluent areas of cortical T2 hyperintensity with associated restricted diffusion involving the bilateral cerebral cortex, more evident in the bilateral anterior frontal lobes, left temporal and left parietal lobes as well as in the bilateral caudate nuclei. There is no associated mass effect, contrast enhancement or involvement of the subjacent white matter. No hemorrhage, hydrocephalus, extra-axial collection or mass lesion. Vascular: Normal flow voids. Skull and upper cervical spine: Normal marrow signal. Sinuses/Orbits: Chronic maxillary sinus disease. The orbits are maintained. Other: None.  IMPRESSION: Extensive confluent areas of cerebral cortical and caudate nuclei T2 hyperintensity with associated restricted diffusion without mass effect or contrast enhancement. 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 Profound hypoxic injury could have similar appearance. Electronically Signed   By: Baldemar Lenis M.D.   On: 11/08/2022 15:47   DG Abd Portable 1V  Result Date: 11/08/2022 CLINICAL DATA:  Feeding tube placement EXAM: PORTABLE ABDOMEN - 1 VIEW COMPARISON:  CT abdomen/pelvis 11/05/2022 FINDINGS: The enteric catheter tip is in the region of the distal stomach/proximal duodenum. There is enteric contrast in the colon. There is mild gaseous distention of the bowel without evidence of mechanical obstruction there is no definite free intraperitoneal air, within the confines of supine technique. The imaged lung bases are clear. There is no acute osseous abnormality. IMPRESSION: Enteric catheter tip in the distal stomach/proximal duodenum. Electronically Signed   By: Lesia Hausen M.D.   On: 11/08/2022 15:24   IR LUMBAR PUNCTURE  Result Date: 11/06/2022 CLINICAL DATA:  Encephalopathy, altered mental status EXAM: DIAGNOSTIC LUMBAR PUNCTURE UNDER FLUOROSCOPIC GUIDANCE COMPARISON:  None Available. FLUOROSCOPY: Radiation Exposure Index (as provided by the fluoroscopic device): 1.0 minutes (17 mGy) PROCEDURE: Informed consent was obtained from the patient prior to the procedure, including potential complications of headache, allergy, and pain. With the patient prone, the lower back was prepped with Betadine. 1% Lidocaine was used for local anesthesia. Lumbar puncture was performed at the L4-L5 level using a 20 gauge needle with return of clear, colorless CSF with an opening pressure of 12 cm water. Approximately 22 ml of CSF were obtained for laboratory studies. Note that the needle had to be repositioned during collection of the last bile, resulting in a hemorrhagic tap.  The initial samples obtained were clear and colorless. The patient tolerated the procedure well and there were no apparent complications. IMPRESSION: 1. Successful lumbar puncture using fluoroscopic guidance. Opening pressure estimated at 12 cm H2O. 2. A total of approximately 22 mL of CSF were collected and sent for laboratory analysis. Note that the needle had to be repositioned during collection of the last sample, resulting in hemorrhagic tap. The initial samples were clear and colorless. Electronically Signed   By: Olive Bass M.D.   On: 11/06/2022 09:36   CT CHEST ABDOMEN PELVIS W CONTRAST  Result Date: 11/05/2022 CLINICAL DATA:  Metastatic disease evaluation. EXAM: CT CHEST, ABDOMEN, AND PELVIS WITH CONTRAST TECHNIQUE: Multidetector CT imaging of the chest, abdomen and pelvis was performed following the standard protocol during bolus administration of intravenous contrast. RADIATION DOSE REDUCTION: This exam was performed according to the departmental dose-optimization program which includes automated exposure control, adjustment of the mA and/or kV according to patient size and/or use of iterative reconstruction technique. CONTRAST:  75mL OMNIPAQUE IOHEXOL 350 MG/ML SOLN COMPARISON:  None Available. FINDINGS: CT CHEST FINDINGS Cardiovascular: No significant vascular findings. Normal heart size. No pericardial effusion. Mediastinum/Nodes: No enlarged mediastinal, hilar, or axillary lymph nodes. Thyroid gland, trachea, and esophagus demonstrate no significant findings. Lungs/Pleura:  There is a calcified granuloma in the right lung apex. There is minimal atelectasis in both lung bases. The lungs are otherwise clear. There is no pleural effusion or pneumothorax. Musculoskeletal: No chest wall mass or suspicious bone lesions identified. CT ABDOMEN PELVIS FINDINGS Hepatobiliary: Gallstones are present. The gallbladder appears contracted. There is questionable wall thickening of the neck of the gallbladder.  There is no biliary ductal dilatation. There is no surrounding inflammation. There is a single cyst in the left lobe of the liver measuring 1.9 cm. No other liver lesions are identified. Pancreas: Unremarkable. No pancreatic ductal dilatation or surrounding inflammatory changes. Spleen: Normal in size without focal abnormality. Adrenals/Urinary Tract: Adrenal glands are unremarkable. Kidneys are normal, without renal calculi, focal lesion, or hydronephrosis. Bladder is unremarkable. Stomach/Bowel: Stomach is within normal limits. Appendix appears normal. No evidence of bowel wall thickening, distention, or inflammatory changes. There is a large amount of stool distending the rectum. Vascular/Lymphatic: Aortic atherosclerosis. No enlarged abdominal or pelvic lymph nodes. Reproductive: Status post hysterectomy. No adnexal masses. Other: No abdominal wall hernia or abnormality. No abdominopelvic ascites. Musculoskeletal: No acute or significant osseous findings. IMPRESSION: 1. No evidence for metastatic disease in the chest, abdomen or pelvis. 2. Cholelithiasis with questionable wall thickening of the neck of the gallbladder. Recommend further evaluation with right upper quadrant ultrasound. 3. Large amount of stool distending the rectum. Aortic Atherosclerosis (ICD10-I70.0). Electronically Signed   By: Darliss Cheney M.D.   On: 11/05/2022 19:38   Overnight EEG with video  Result Date: 11/04/2022 Charlsie Quest, MD     11/05/2022 10:05 AM Patient Name: Jody Nolan MRN: [id number] Epilepsy Attending: Charlsie Quest Referring Physician/Provider: Milon Dikes, MD Duration: 11/04/2022 0202 to 11/05/2022 0202 Patient history: 57 year old female with a history of altered mental status the past 3 weeks. EEG to evaluate for seizure Level of alertness: Awake, asleep AEDs during EEG study: LEV Technical aspects: This EEG study was done with scalp electrodes positioned according to the 10-20 International system of  electrode placement. Electrical activity was reviewed with band pass filter of 1-70Hz , sensitivity of 7 uV/mm, display speed of 47mm/sec with a 60Hz  notched filter applied as appropriate. EEG data were recorded continuously and digitally stored.  Video monitoring was available and reviewed as appropriate. Description: No clear posterior dominant rhythm was seen. EEG showed continuous generalized 3 to 6 Hz theta-delta slowing. Generalized periodic discharges ( GPDS)with triphasic morphology were noted at 1-1.5 Hz, more prominent when awake/stimulated. At times the GPDs appeared more prominent in the left hemisphere. Hyperventilation and photic stimulation were not performed.   ABNORMALITY - Periodic discharges with triphasic morphology, generalized ( GPDs) - Continuous slow, generalized IMPRESSION: This study showed generalized periodic discharges with triphasic morphology which can be on the ictal-interictal continuum. However, the morphology, frequency and reactivity to stimulation is more commonly suggestive of toxic-metabolic causes. Additionally there is moderate diffuse encephalopathy. No seizures were seen throughout the recording. Charlsie Quest   EEG adult  Result Date: 11/03/2022 Rejeana Brock, MD     11/03/2022  8:15 PM History: 27-year-old female with a history of altered mental status the past 3 weeks. Sedation: None Technique: This EEG was acquired with electrodes placed according to the International 10-20 electrode system (including Fp1, Fp2, F3, F4, C3, C4, P3, P4, O1, O2, T3, T4, T5, T6, A1, A2, Fz, Cz, Pz). The following electrodes were missing or displaced: none. Background: There is a posterior dominant rhythm of 8 to 9 Hz which is seen.  In addition, there are frequent generalized periodic discharges with a frontal predominance and triphasic morphology.  Though these do have a characteristic triphasic morphology and distribution, there is a waxing/waning in nature with variance in  frequency from 1 to 2 Hz, though no definite evolution.  There is also generalized irregular delta and theta activity intruding into the background. Photic stimulation: Physiologic driving is not performed EEG Abnormalities: 1) generalized periodic discharges with triphasic morphology and waxing/waning frequency 2) generalized irregular slow activity Clinical Interpretation: This EEG recorded a pattern of the ictal-interictal continuum consisting of generalized discharges with triphasic morphology.  This pattern is typically felt to be less likely to be ictal, the waxing/waning nature does give it a slightly unusual appearance and I would favor continuous EEG monitoring to assess for sleep/wake cycle modification, as well as to look for intermittent more clearly defined seizures.  No definite seizure was recorded. Ritta Slot, MD [facility] [phone] If 7pm- 7am, please page neurology on call as listed in AMION.   CT ANGIO HEAD NECK W WO CM  Result Date: 11/02/2022 CLINICAL DATA:  Altered mental status. EXAM: CT ANGIOGRAPHY HEAD AND NECK WITH AND WITHOUT CONTRAST TECHNIQUE: Multidetector CT imaging of the head and neck was performed using the standard protocol during bolus administration of intravenous contrast. Multiplanar CT image reconstructions and MIPs were obtained to evaluate the vascular anatomy. Carotid stenosis measurements (when applicable) are obtained utilizing NASCET criteria, using the distal internal carotid diameter as the denominator. RADIATION DOSE REDUCTION: This exam was performed according to the departmental dose-optimization program which includes automated exposure control, adjustment of the mA and/or kV according to patient size and/or use of iterative reconstruction technique. CONTRAST:  75mL OMNIPAQUE IOHEXOL 350 MG/ML SOLN COMPARISON:  Same day brain MRI FINDINGS: CT HEAD FINDINGS Brain: There is no acute intracranial hemorrhage, extra-axial fluid collection,  or acute large vessel territorial infarct. The areas of diffusion restriction seen on the same-day brain MRI are not well appreciated on the current study. Parenchymal volume is normal. The ventricles are normal in size. Pituitary and suprasellar region are normal. There is no mass lesion. There is no mass effect or midline shift. Vascular: See below. Skull: Normal. Negative for fracture or focal lesion. Sinuses/Orbits: There is mild mucosal thickening in the paranasal sinuses with hyperostosis of the maxillary sinus walls consistent with chronic sinusitis. The globes and orbits are unremarkable. Other: The mastoid air cells and middle ear cavities are clear. Review of the MIP images confirms the above findings CTA NECK FINDINGS Aortic arch: The imaged aortic arch is normal. The origins of the major branch vessels are patent. The subclavian arteries are patent to the level imaged. Right carotid system: The right common, internal, and external carotid arteries are patent, without hemodynamically significant stenosis or occlusion. There is no evidence of dissection or aneurysm. Left carotid system: The left common, internal, and external carotid arteries are patent, without hemodynamically significant stenosis or occlusion. There is a beaded appearance of the internal carotid artery (14-20). 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 Vertebral arteries: The vertebral arteries are patent, without hemodynamically significant stenosis or occlusion there is no evidence of dissection or aneurysm. Skeleton: There is no acute osseous abnormality or suspicious osseous lesion. There is mild disc space narrowing degenerative endplate change C5-C6 and C6-C7. There is no visible canal hematoma. Other neck: The soft tissues of the neck are unremarkable. Upper chest: There  is a small calcified granuloma in the right apex. The imaged  lung apices are otherwise clear. Review of the MIP images confirms the above findings CTA HEAD FINDINGS Anterior circulation: The intracranial ICAs are patent, without significant stenosis or occlusion. The bilateral MCAs and ACAS are patent, without proximal stenosis or occlusion. The anterior communicating artery is normal. There is no aneurysm or AVM. Posterior circulation: The bilateral V4 segments are patent. The basilar artery is patent. The major cerebellar arteries appear patent. The bilateral PCAs are patent, without proximal stenosis or occlusion. Right larger than left posterior communicating arteries are identified. There is no aneurysm or AVM. Venous sinuses: Choose Anatomic variants: None. Review of the MIP images confirms the above findings IMPRESSION: 1. Patent vasculature of the head and neck without hemodynamically significant or occlusion. No evidence of vasospasm or RCVS in the intracranial vasculature. 2. 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 Electronically Signed   By: Lesia Hausen M.D.   On: 11/02/2022 18:36   MR Brain W and Wo Contrast  Result Date: 11/02/2022 CLINICAL DATA:  57 year old female with recent change in behavior, altered mental status, disoriented. EXAM: MRI HEAD WITHOUT AND WITH CONTRAST TECHNIQUE: Multiplanar, multiecho pulse sequences of the brain and surrounding structures were obtained without and with intravenous contrast. CONTRAST:  7mL GADAVIST GADOBUTROL 1 MMOL/ML IV SOLN COMPARISON:  None Available. FINDINGS: Brain: Normal cerebral volume. No midline shift or evidence of intracranial mass effect. However, there is multifocal bilateral abnormal cortical diffusion of the brain, most apparent in the parietal lobes and greater on the left (series 5 images 33 and 36), but also  affecting the posterolateral left temporal lobe (image 21). See also coronal DWI images 28 and 29. There is associated cortical FLAIR hyperintensity in some of those areas, but the underlying white matter seems spared. At the same time, there is similar asymmetric diffusion and FLAIR hyperintensity also in the left caudate nucleus (series 5 image 25). No mass effect associated in these areas. No T1 signal abnormality although rapid T1 imaging was utilized to minimize motion. And furthermore, there could be abnormally exaggerated cortical diffusion throughout the anterior frontal lobes, versus artifact (series 5, image 35). Cortical FLAIR hyperintensity there also difficult to exclude. No abnormal enhancement identified. No dural thickening. No superimposed ventriculomegaly, ventricular debris, extra-axial collection or convincing acute intracranial hemorrhage. Cervicomedullary junction and pituitary are within normal limits. No hemosiderin or mineralization on T2* imaging. Outside of the left caudate nucleus the deep gray nuclei, brainstem and cerebellum appear negative. Vascular: Major intracranial vascular flow voids appear preserved. Skull and upper cervical spine: Negative visible cervical spine. Visualized bone marrow signal is within normal limits. Sinuses/Orbits: Disconjugate gaze but otherwise negative orbits. Moderate bilateral paranasal sinus mucosal thickening and opacification, possibly with periosteal thickening on series 10, image 5. Other: Mastoids are clear. Grossly normal visible internal auditory structures. Negative visible scalp and face. IMPRESSION: Widespread, bilateral abnormal cortical diffusion and FLAIR signal in a nonspecific pattern. Suspect asymmetric caudate nucleus involvement also on the left. But white matter seems spared, there is no associated abnormal enhancement, and no intracranial mass effect. Broad differential diagnosis at this point and recommend Neurology consultation with  consideration of: Infectious Meningoencephalitis (although lack of enhancement argues against), Reversible cerebral vasoconstriction (RCVS), Urea cycle disorders/Uremia, Hypoglycemia, Seizure disorder, PRES (although sparing of white matter argues against),  Hepatic encephalopathy, Mitochondrial disorder, and Creutzfeldt-jakob disease. Electronically Signed   By: Odessa Fleming M.D.   On: 11/02/2022 05:34    Microbiology: Results for orders placed or performed in visit on 04/09/18  Urine Culture     Status: Abnormal   Collection Time: 04/09/18  4:45 PM   Specimen: Urine   URINE  Result Value Ref Range Status   Urine Culture, Routine Final report (A)  Final   Organism ID, Bacteria Escherichia coli (A)  Final    Comment: Greater than 100,000 colony forming units per mL Cefazolin <=4 ug/mL Cefazolin with an MIC <=16 predicts susceptibility to the oral agents cefaclor, cefdinir, cefpodoxime, cefprozil, cefuroxime, cephalexin, and loracarbef when used for therapy of uncomplicated urinary tract infections due to E. coli, Klebsiella pneumoniae, and Proteus mirabilis.    Antimicrobial Susceptibility Comment  Final    Comment:       ** S = Susceptible; I = Intermediate; R = Resistant **                    P = Positive; N = Negative             MICS are expressed in micrograms per mL    Antibiotic                 RSLT#1    RSLT#2    RSLT#3    RSLT#4 Amoxicillin/Clavulanic Acid    S Ampicillin                     S Cefepime                       S Ceftriaxone                    S Cefuroxime                     S Ciprofloxacin                  S Ertapenem                      S Gentamicin                     S Imipenem                       S Levofloxacin                   S Meropenem                      S Nitrofurantoin                 S Piperacillin/Tazobactam        S Tetracycline                   S Tobramycin                     S Trimethoprim/Sulfa             S    Labs: CBC: Recent  Labs  Lab 11/13/22 0120  WBC 6.6  NEUTROABS 6.1  HGB 15.3*  HCT 46.4*  MCV 89.2  PLT 332   Basic Metabolic Panel: Recent Labs  Lab 11/09/22 1640 11/13/22 0120  NA  --  137  K  --  3.9  CL  --  103  CO2  --  22  GLUCOSE  --  210*  BUN  --  16  CREATININE  --  0.76  CALCIUM  --  9.1  MG 2.3  --   PHOS 2.8  --    Liver Function Tests: No results for input(s): "AST", "ALT", "ALKPHOS", "BILITOT", "PROT", "ALBUMIN" in the last 168 hours. CBG: Recent Labs  Lab 11/09/22 1533  GLUCAP 205*   Discharge time spent: greater than 30 minutes.  Signed: Marguerita Merles, DO [facility] 11/16/2022

## 2022-11-16 NOTE — Progress Notes (Signed)
Palliative:  HPI: 58 yo with PMH of depression, anxiety, DUB presented on 7/10 to [facility] with abrupt onset 3 weeks ago of progressive and precipitous cognitive decline, with first symptom being short term memory impairment, forgetting key details of her daughter's stated plans for a trip multiple times, manifested as forgetting over time frames of a few hours despite repeatedly being informed/corrected regarding the information she was not retaining. A few days later, her behavior started to become altered and somewhat inappropriate. Neurology and ID were consulted. Being worked up to rule of CJD, paraneoplastic syndrome, autoimmune encephalitis, arbovirus, lyme disease, and/or RMSF. Palliative care has been consulted in the setting of this very difficult situation to support family and help discern goals of care if improvement neglect to be seen.      I met today at Jody Nolan's bedside with mother. Plans for transition to [facility] today. Family understanding of plan and prognosis. Coordinated with Dr. Marland Mcalpine, RN, [facility], hospice liaison to plan for transition to hospice today. Medications ordered per recommendations for comfort.   Exam: Alert, oriented difficult to assess but she can at times she can follow some simple commands or mimic greeting. No distress. Breathing regular unlabored. Abd soft. Generalized weakness and fatigue.    Plan: - DNR, comfort care - [facility] transfer for end of life care  25 min  Jody Channel, NP Palliative Medicine Team Pager [phone] (Please see [email or web address] for schedule) Team Phone [phone]    Greater than 50%  of this time was spent counseling and coordinating care related to the above assessment and plan

## 2022-11-16 NOTE — Progress Notes (Signed)
Patient's PICC line and foley catheter are still intact per provider order. Called and gave report to nurse at [facility] at 1150. Patient given IV morphine at 1233. Ivar Drape, the Licensed conveyancer, brought patient belongings down to patient's mother's vehicle out front. [facility] arrived and took patient away on stretcher with her discharge paperwork including DNR paperwork.

## 2022-11-23 DEATH — deceased
